# Patient Record
Sex: Male | Born: 1995 | Race: White | Hispanic: No | Marital: Single | Smoking: Never smoker
Health system: Southern US, Community
[De-identification: ages and names within clinical notes are randomized; demographics above are authoritative.]

## PROBLEM LIST (undated history)

## (undated) DIAGNOSIS — R519 Headache, unspecified: Secondary | ICD-10-CM

---

## 2017-07-10 ENCOUNTER — Inpatient Hospital Stay (HOSPITAL_COMMUNITY)
Admission: EM | Admit: 2017-07-10 | Discharge: 2017-07-12 | DRG: 558 | Disposition: A | Discharge status: Home / Self Care | Payer: BLUE CROSS/BLUE SHIELD | Attending: Internal Medicine | Admitting: Internal Medicine

## 2017-07-10 ENCOUNTER — Encounter (HOSPITAL_COMMUNITY): Payer: Self-pay | Admitting: Emergency Medicine

## 2017-07-10 DIAGNOSIS — M6282 Rhabdomyolysis: Secondary | ICD-10-CM | POA: Diagnosis not present

## 2017-07-10 DIAGNOSIS — R112 Nausea with vomiting, unspecified: Secondary | ICD-10-CM | POA: Diagnosis not present

## 2017-07-10 DIAGNOSIS — N179 Acute kidney failure, unspecified: Secondary | ICD-10-CM

## 2017-07-10 DIAGNOSIS — E860 Dehydration: Secondary | ICD-10-CM | POA: Diagnosis present

## 2017-07-10 DIAGNOSIS — E876 Hypokalemia: Secondary | ICD-10-CM | POA: Diagnosis present

## 2017-07-10 LAB — COMPREHENSIVE METABOLIC PANEL
ALBUMIN: 6 g/dL — AB (ref 3.5–5.0)
ALK PHOS: 62 U/L (ref 38–126)
ALT: 30 U/L (ref 17–63)
AST: 52 U/L — AB (ref 15–41)
Anion gap: 18 — ABNORMAL HIGH (ref 5–15)
BUN: 48 mg/dL — AB (ref 6–20)
CALCIUM: 10.5 mg/dL — AB (ref 8.9–10.3)
CO2: 23 mmol/L (ref 22–32)
Chloride: 90 mmol/L — ABNORMAL LOW (ref 101–111)
Creatinine, Ser: 2.57 mg/dL — ABNORMAL HIGH (ref 0.61–1.24)
GFR calc non Af Amer: 34 mL/min — ABNORMAL LOW (ref >60)
GFR, EST AFRICAN AMERICAN: 39 mL/min — AB (ref >60)
GLUCOSE: 115 mg/dL — AB (ref 65–99)
Potassium: 3.3 mmol/L — ABNORMAL LOW (ref 3.5–5.1)
SODIUM: 131 mmol/L — AB (ref 135–145)
Total Bilirubin: 2.3 mg/dL — ABNORMAL HIGH (ref 0.3–1.2)
Total Protein: 10.2 g/dL — ABNORMAL HIGH (ref 6.5–8.1)

## 2017-07-10 LAB — URINALYSIS, ROUTINE W REFLEX MICROSCOPIC
Glucose, UA: 50 mg/dL — AB
Ketones, ur: 5 mg/dL — AB
Leukocytes, UA: NEGATIVE
Nitrite: NEGATIVE
PH: 5 (ref 5.0–8.0)
Protein, ur: 100 mg/dL — AB
SPECIFIC GRAVITY, URINE: 1.03 (ref 1.005–1.030)

## 2017-07-10 LAB — CK: CK TOTAL: 1228 U/L — AB (ref 49–397)

## 2017-07-10 LAB — CBC
HCT: 47.5 % (ref 39.0–52.0)
Hemoglobin: 18.2 g/dL — ABNORMAL HIGH (ref 13.0–17.0)
MCH: 31.3 pg (ref 26.0–34.0)
MCHC: 38.3 g/dL — AB (ref 30.0–36.0)
MCV: 81.6 fL (ref 78.0–100.0)
PLATELETS: 278 10*3/uL (ref 150–400)
RBC: 5.82 MIL/uL — ABNORMAL HIGH (ref 4.22–5.81)
RDW: 11.9 % (ref 11.5–15.5)
WBC: 11.9 10*3/uL — ABNORMAL HIGH (ref 4.0–10.5)

## 2017-07-10 LAB — LIPASE, BLOOD: Lipase: 22 U/L (ref 11–51)

## 2017-07-10 MED ORDER — ONDANSETRON 4 MG PO TBDP
4.0000 mg | ORAL_TABLET | Freq: Once | ORAL | Status: AC | PRN
  Administered 2017-07-10: 4 mg via ORAL
  Filled 2017-07-10: qty 1

## 2017-07-10 MED ORDER — SODIUM CHLORIDE 0.9 % IV BOLUS (SEPSIS)
1000.0000 mL | Freq: Once | INTRAVENOUS | Status: AC
  Administered 2017-07-10: 1000 mL via INTRAVENOUS

## 2017-07-10 MED ORDER — GI COCKTAIL ~~LOC~~
30.0000 mL | Freq: Once | ORAL | Status: AC
  Administered 2017-07-10: 30 mL via ORAL
  Filled 2017-07-10: qty 30

## 2017-07-10 MED ORDER — ONDANSETRON HCL 4 MG/2ML IJ SOLN
4.0000 mg | Freq: Once | INTRAMUSCULAR | Status: AC
  Administered 2017-07-11: 4 mg via INTRAVENOUS
  Filled 2017-07-10: qty 2

## 2017-07-10 MED ORDER — FAMOTIDINE IN NACL 20-0.9 MG/50ML-% IV SOLN
20.0000 mg | Freq: Once | INTRAVENOUS | Status: AC
  Administered 2017-07-10: 20 mg via INTRAVENOUS
  Filled 2017-07-10: qty 50

## 2017-07-10 NOTE — ED Provider Notes (Signed)
[facility] DEPT Provider Note   CSN: [id number] Arrival date & time: 07/10/17  1742    History   Chief Complaint Chief Complaint  Patient presents with  . Abdominal Pain  . Emesis    HPI Scott Snyder is a 22 y.o. male.  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  He states that his symptoms began on Tuesday after 1 hour of circuit training with weights.  Patient reports feeling nauseous at the end of practice.  He tried to drink fluids, but vomited shortly after.  He also experienced discomfort in his upper abdomen.  Patient states that he has been unable to keep down food or fluids since onset of his symptoms 4 days ago.  He has had approximately 40 episodes of emesis.  These have been nonbloody.  He has developed progressive dysphasia without inability to swallow or drooling.  Abdominal pain is diffuse and associated with back pain.  He began to notice darker discoloration to his urine today.  And he did not void at all yesterday.  He has not had any associated fevers.  No sick contacts.  He is otherwise healthy and takes no medications on a daily basis.      History reviewed. No pertinent past medical history.  There are no active problems to display for this patient.   History reviewed. No pertinent surgical history.     Home Medications    Prior to Admission medications   Not on File    Family History No family history on file.  Social History Social History   Tobacco Use  . Smoking status: Never Smoker  . Smokeless tobacco: Never Used  Substance Use Topics  . Alcohol use: No    Frequency: Never  . Drug use: No     Allergies   Patient has no known allergies.   Review of Systems Review of Systems Ten systems reviewed and are negative for acute change, except as noted in the HPI.    Physical  Exam Updated Vital Signs BP (!) 143/103 (BP Location: Left Arm)   Pulse 68   Temp 98.8 F (37.1 C) (Oral)   Resp 19   Ht 6\' 3"  (1.905 m)   Wt 90.7 kg (200 lb)   SpO2 97%   BMI 25.00 kg/m   Physical Exam  Constitutional: He is oriented to person, place, and time. He appears well-developed and well-nourished. No distress.  HENT:  Head: Normocephalic and atraumatic.  Dry mucous membranes.  Cheilitis.  Eyes: Conjunctivae and EOM are normal. No scleral icterus.  Neck: Normal range of motion.  Cardiovascular: Regular rhythm and intact distal pulses.  Mild tachycardia  Pulmonary/Chest: Effort normal. No stridor. No respiratory distress.  Lungs clear to auscultation bilaterally.  Chest expansion symmetric.  Abdominal: Soft. He exhibits no distension. There is no guarding.  No focal abdominal tenderness on exam.  No distention or peritoneal signs.  Musculoskeletal: Normal range of motion.  Neurological: He is alert and oriented to person, place, and time. He exhibits normal muscle tone. Coordination normal.  Skin: Skin is warm and dry. No rash noted. He is not diaphoretic. No erythema. No pallor.  Psychiatric: He has a normal mood and affect. His behavior is normal.  Nursing note and vitals reviewed.    ED Treatments / Results  Labs (all labs ordered are listed, but only abnormal results are  displayed) Labs Reviewed  COMPREHENSIVE METABOLIC PANEL - Abnormal; Notable for the following components:      Result Value   Sodium 131 (*)    Potassium 3.3 (*)    Chloride 90 (*)    Glucose, Bld 115 (*)    BUN 48 (*)    Creatinine, Ser 2.57 (*)    Calcium 10.5 (*)    Total Protein 10.2 (*)    Albumin 6.0 (*)    AST 52 (*)    Total Bilirubin 2.3 (*)    GFR calc non Af Amer 34 (*)    GFR calc Af Amer 39 (*)    Anion gap 18 (*)    All other components within normal limits  CBC - Abnormal; Notable for the following components:   WBC 11.9 (*)    RBC 5.82 (*)    Hemoglobin 18.2 (*)     MCHC 38.3 (*)    All other components within normal limits  URINALYSIS, ROUTINE W REFLEX MICROSCOPIC - Abnormal; Notable for the following components:   Color, Urine AMBER (*)    APPearance HAZY (*)    Glucose, UA 50 (*)    Hgb urine dipstick MODERATE (*)    Bilirubin Urine SMALL (*)    Ketones, ur 5 (*)    Protein, ur 100 (*)    Bacteria, UA RARE (*)    Squamous Epithelial / LPF 0-5 (*)    All other components within normal limits  CK - Abnormal; Notable for the following components:   Total CK 1,228 (*)    All other components within normal limits  LIPASE, BLOOD    EKG  EKG Interpretation None       Radiology No results found.  Procedures Procedures (including critical care time)  Medications Ordered in ED Medications  ondansetron (ZOFRAN) injection 4 mg (not administered)  ondansetron (ZOFRAN-ODT) disintegrating tablet 4 mg (4 mg Oral Given 07/10/17 1833)  sodium chloride 0.9 % bolus 1,000 mL (1,000 mLs Intravenous New Bag/Given 07/10/17 2328)  gi cocktail (Maalox,Lidocaine,Donnatal) (30 mLs Oral Given 07/10/17 2320)  famotidine (PEPCID) IVPB 20 mg premix (0 mg Intravenous Stopped 07/11/17 0018)  sodium chloride 0.9 % bolus 1,000 mL (1,000 mLs Intravenous New Bag/Given 07/10/17 2328)     Initial Impression / Assessment and Plan / ED Course  I have reviewed the triage vital signs and the nursing notes.  Pertinent labs & imaging results that were available during my care of the patient were reviewed by me and considered in my medical decision making (see chart for details).     10:51 PM Patient presenting for generalized abdominal discomfort as well as back pain.  Symptoms have been associated with nausea and vomiting over the past 4 days.  Patient unable to tolerate p.o. secondary to vomiting.  Symptoms began after 1 hour of circuit training with weights.  Physical exam and current laboratory workup concerning for rhabdomyolysis.  There is evidence of acute renal  failure for which the patient will require admission.  IV hydration initiated.  We will continue to follow.  11:45 PM Urine consistent with dehydration with nonspecific pyuria.  Low suspicion for UTI.  CK is elevated supporting dehydration and rhabdomyolysis.  Patient feels slightly nauseated after receiving the GI cocktail, but states that it has helped his throat.  Zofran has been ordered for nausea management.  Case discussed with Dr. Si Raider of [facility] who will admit for hydration/management of ARF.   Final Clinical Impressions(s) / ED Diagnoses  Final diagnoses:  Acute renal failure, unspecified acute renal failure type [facility])    ED Discharge Orders    None       Antonietta Breach, PA-C 07/11/17 Nancy Marus, MD 07/11/17 [phone]

## 2017-07-10 NOTE — ED Triage Notes (Signed)
Patient reports abd pain with n/v sicne WED. Reports right lower back pain since today with orange colored urine.

## 2017-07-11 ENCOUNTER — Encounter (HOSPITAL_COMMUNITY): Payer: Self-pay

## 2017-07-11 ENCOUNTER — Other Ambulatory Visit: Payer: Self-pay

## 2017-07-11 DIAGNOSIS — R112 Nausea with vomiting, unspecified: Secondary | ICD-10-CM | POA: Diagnosis present

## 2017-07-11 DIAGNOSIS — E860 Dehydration: Secondary | ICD-10-CM | POA: Diagnosis present

## 2017-07-11 DIAGNOSIS — M6282 Rhabdomyolysis: Secondary | ICD-10-CM | POA: Diagnosis present

## 2017-07-11 DIAGNOSIS — E876 Hypokalemia: Secondary | ICD-10-CM | POA: Diagnosis present

## 2017-07-11 DIAGNOSIS — N179 Acute kidney failure, unspecified: Secondary | ICD-10-CM

## 2017-07-11 LAB — CK: Total CK: 861 U/L — ABNORMAL HIGH (ref 49–397)

## 2017-07-11 LAB — COMPREHENSIVE METABOLIC PANEL
ALT: 26 U/L (ref 17–63)
ANION GAP: 14 (ref 5–15)
AST: 37 U/L (ref 15–41)
Albumin: 4.4 g/dL (ref 3.5–5.0)
Alkaline Phosphatase: 47 U/L (ref 38–126)
BUN: 38 mg/dL — ABNORMAL HIGH (ref 6–20)
CHLORIDE: 99 mmol/L — AB (ref 101–111)
CO2: 19 mmol/L — AB (ref 22–32)
Calcium: 9 mg/dL (ref 8.9–10.3)
Creatinine, Ser: 1.52 mg/dL — ABNORMAL HIGH (ref 0.61–1.24)
GFR calc Af Amer: >60 mL/min (ref >60)
GFR calc non Af Amer: >60 mL/min (ref >60)
Glucose, Bld: 139 mg/dL — ABNORMAL HIGH (ref 65–99)
Potassium: 3.2 mmol/L — ABNORMAL LOW (ref 3.5–5.1)
Sodium: 132 mmol/L — ABNORMAL LOW (ref 135–145)
Total Bilirubin: 1.6 mg/dL — ABNORMAL HIGH (ref 0.3–1.2)
Total Protein: 7.7 g/dL (ref 6.5–8.1)

## 2017-07-11 LAB — HIV ANTIBODY (ROUTINE TESTING W REFLEX): HIV SCREEN 4TH GENERATION: NONREACTIVE

## 2017-07-11 LAB — MAGNESIUM: MAGNESIUM: 1.9 mg/dL (ref 1.7–2.4)

## 2017-07-11 MED ORDER — DEXTROSE-NACL 5-0.45 % IV SOLN
INTRAVENOUS | Status: DC
  Administered 2017-07-11 (×3): via INTRAVENOUS

## 2017-07-11 MED ORDER — ONDANSETRON HCL 4 MG/2ML IJ SOLN
4.0000 mg | Freq: Four times a day (QID) | INTRAMUSCULAR | Status: DC | PRN
  Administered 2017-07-11: 4 mg via INTRAVENOUS
  Filled 2017-07-11 (×2): qty 2

## 2017-07-11 MED ORDER — FAMOTIDINE IN NACL 20-0.9 MG/50ML-% IV SOLN
20.0000 mg | INTRAVENOUS | Status: DC
  Administered 2017-07-11 – 2017-07-12 (×2): 20 mg via INTRAVENOUS
  Filled 2017-07-11 (×2): qty 50

## 2017-07-11 MED ORDER — PROCHLORPERAZINE EDISYLATE 5 MG/ML IJ SOLN
10.0000 mg | Freq: Four times a day (QID) | INTRAMUSCULAR | Status: DC | PRN
  Administered 2017-07-11 – 2017-07-12 (×2): 10 mg via INTRAVENOUS
  Filled 2017-07-11 (×2): qty 2

## 2017-07-11 MED ORDER — PROCHLORPERAZINE EDISYLATE 5 MG/ML IJ SOLN: 10.0000 mg | Freq: Four times a day (QID) | INTRAMUSCULAR | Status: DC | PRN

## 2017-07-11 MED ORDER — MENTHOL 3 MG MT LOZG
1.0000 | LOZENGE | OROMUCOSAL | Status: DC | PRN
  Administered 2017-07-11: 3 mg via ORAL
  Filled 2017-07-11: qty 9

## 2017-07-11 MED ORDER — BOOST / RESOURCE BREEZE PO LIQD CUSTOM
1.0000 | Freq: Three times a day (TID) | ORAL | Status: DC
  Administered 2017-07-11: 1 via ORAL

## 2017-07-11 MED ORDER — ONDANSETRON HCL 40 MG/20ML IJ SOLN
8.0000 mg | Freq: Four times a day (QID) | INTRAMUSCULAR | Status: DC | PRN
  Filled 2017-07-11: qty 4

## 2017-07-11 MED ORDER — POTASSIUM CHLORIDE CRYS ER 20 MEQ PO TBCR
40.0000 meq | EXTENDED_RELEASE_TABLET | ORAL | Status: AC
  Administered 2017-07-11 (×2): 40 meq via ORAL
  Filled 2017-07-11 (×2): qty 2

## 2017-07-11 MED ORDER — SODIUM CHLORIDE 0.9 % IV BOLUS (SEPSIS)
2000.0000 mL | Freq: Once | INTRAVENOUS | Status: AC
  Administered 2017-07-11: 2000 mL via INTRAVENOUS

## 2017-07-11 MED ORDER — ONDANSETRON 4 MG PO TBDP
8.0000 mg | ORAL_TABLET | Freq: Once | ORAL | Status: AC | PRN
  Administered 2017-07-11: 8 mg via ORAL
  Filled 2017-07-11: qty 2

## 2017-07-11 NOTE — Plan of Care (Signed)
Admit 07/11/17

## 2017-07-11 NOTE — H&P (Signed)
History and Physical    Ruhan Borak HER:[id number] DOB: Jul 16, 1995 DOA: 07/10/2017  PCP: Patient, No Pcp Per  Patient coming from: home   Chief Complaint: nausea and vomiting  HPI: Cosimo Schertzer is a 22 y.o. male with no sig medical history presents w/ vomiting.  Was in normal state of health until symptoms began 2 days ago. Is an athlete - plays football - and went to morning practice where engaged in more vigorous than usual training, which included lots of squats and other weight training. Towards the end developed abdominal cramps and leg cramps and then developed nausea with multiple episodes of nbnb vomiting. No fever or diarrhea. Abdominal cramps and leg cramps persisted into the next day, as did multiple episodes of vomtiing and inability to tollerate PO. Also developed dark orange urine. Nausea and vomiting have improved today but did vomit 4 times today. musle pains have improved - no abdominal pain currently, but does have throat pain. Denies history of similar symptoms  ED Course: IV fluids, labs, anti-emetic  Review of Systems: As per HPI otherwise 10 point review of systems negative.    History reviewed. No pertinent past medical history.  History reviewed. No pertinent surgical history.   reports that  has never smoked. he has never used smokeless tobacco. He reports that he does not drink alcohol or use drugs.  No Known Allergies  No family history on file.  Prior to Admission medications   Not on File    Physical Exam: Vitals:   07/10/17 1829 07/10/17 2041 07/10/17 2300 07/10/17 2340  BP: 133/89 (!) 126/98 (!) 144/97 (!) 143/103  Pulse: 96 (!) 105 72 68  Resp: 19 20 17 19   Temp: 97.6 F (36.4 C) 98.8 F (37.1 C)    TempSrc: Oral Oral    SpO2: 99% 97% 100% 97%  Weight: 90.7 kg (200 lb)     Height: 6\' 3"  (1.905 m)       Constitutional: No acute distress Head: Atraumatic Eyes: Conjunctiva clear ENM: Moist mucous membranes. Normal dentition.  Neck:  Supple Respiratory: Clear to auscultation bilaterally, no wheezing/rales/rhonchi. Normal respiratory effort. No accessory muscle use. . Cardiovascular: Regular rate and rhythm. No murmurs/rubs/gallops. Abdomen: Non-tender, non-distended. No masses. No rebound or guarding. Positive bowel sounds. Musculoskeletal: No joint deformity upper and lower extremities. Normal ROM, no contractures. Normal muscle tone.  Skin: No rashes, lesions, or ulcers.  Extremities: No peripheral edema. Palpable peripheral pulses. Neurologic: Alert, moving all 4 extremities. Psychiatric: Normal insight and judgement.   Labs on Admission: I have personally reviewed following labs and imaging studies  CBC: Recent Labs  Lab 07/10/17 1836  WBC 11.9*  HGB 18.2*  HCT 47.5  MCV 81.6  PLT 856   Basic Metabolic Panel: Recent Labs  Lab 07/10/17 1836  NA 131*  K 3.3*  CL 90*  CO2 23  GLUCOSE 115*  BUN 48*  CREATININE 2.57*  CALCIUM 10.5*   GFR: Estimated Creatinine Clearance: 54.3 mL/min (A) (by C-G formula based on SCr of 2.57 mg/dL (H)). Liver Function Tests: Recent Labs  Lab 07/10/17 1836  AST 52*  ALT 30  ALKPHOS 62  BILITOT 2.3*  PROT 10.2*  ALBUMIN 6.0*   Recent Labs  Lab 07/10/17 1836  LIPASE 22   No results for input(s): AMMONIA in the last 168 hours. Coagulation Profile: No results for input(s): INR, PROTIME in the last 168 hours. Cardiac Enzymes: Recent Labs  Lab 07/10/17 2217  CKTOTAL 1,228*   BNP (last 3  results) No results for input(s): PROBNP in the last 8760 hours. HbA1C: No results for input(s): HGBA1C in the last 72 hours. CBG: No results for input(s): GLUCAP in the last 168 hours. Lipid Profile: No results for input(s): CHOL, HDL, LDLCALC, TRIG, CHOLHDL, LDLDIRECT in the last 72 hours. Thyroid Function Tests: No results for input(s): TSH, T4TOTAL, FREET4, T3FREE, THYROIDAB in the last 72 hours. Anemia Panel: No results for input(s): VITAMINB12, FOLATE, FERRITIN,  TIBC, IRON, RETICCTPCT in the last 72 hours. Urine analysis:    Component Value Date/Time   COLORURINE AMBER (A) 07/10/2017 2043   APPEARANCEUR HAZY (A) 07/10/2017 2043   LABSPEC 1.030 07/10/2017 2043   PHURINE 5.0 07/10/2017 2043   GLUCOSEU 50 (A) 07/10/2017 2043   HGBUR MODERATE (A) 07/10/2017 2043   BILIRUBINUR SMALL (A) 07/10/2017 2043   KETONESUR 5 (A) 07/10/2017 2043   PROTEINUR 100 (A) 07/10/2017 2043   NITRITE NEGATIVE 07/10/2017 2043   LEUKOCYTESUR NEGATIVE 07/10/2017 2043    Radiological Exams on Admission: No results found.    Assessment/Plan Active Problems:   Rhabdomyolysis   AKI (acute kidney injury) ([place])   # Acute rhabdomyolysis - typical symptoms, and in setting of intense physical exercise. CK elevated to 1000s, ua significant for blood and bilirubin, and AKI present. Mentating clearly. Appears to be resolving. Is s/p 3 L NS in ED. Given symptoms began right after intense physical work-out, and given no fevers or diarrhea, think GI infection much less likely. - d51/2ns @ 200/hr - repeat cmp in AM - zofran prn - clear liquids, advance as tolerated - f/u EKG  # AKI - 2/2 rhabdomyolysis and dehydration. - fluids and kidney function monitoring as above  DVT prophylaxis: SCDs, early ambulation Code Status: full  Family Communication: mother lela Fede [phone]  Disposition Plan: home  Consults called: none  Admission status: med/surg    Desma Maxim MD [facility] Pager [phone]  If 7PM-7AM, please contact night-coverage [email or web address] Password [facility]  07/11/2017, 12:24 AM

## 2017-07-11 NOTE — Progress Notes (Signed)
Dr. Grandville Silos aware via text pt vomited after Resource nutrition drink. See orders entered into Epic by MD to dc supplement and give one time Zofran 8 mg SL. Pt reassured.

## 2017-07-11 NOTE — ED Notes (Addendum)
WILL TRANSPORT PT TO 5W 1535-1. AAOX4. PT IN NO APPARENT DISTRESS OR PAIN. THE OPPORTUNITY TO ASK QUESTIONS WAS PROVIDED.  ED TO INPATIENT HANDOFF REPORT  Name/Age/Gender Scott Snyder 22 y.o. male  Code Status    Code Status Orders  (From admission, onward)        Start     Ordered   07/11/17 0145  Full code  Continuous     07/11/17 0144    Code Status History    Date Active Date Inactive Code Status Order ID Comments User Context   This patient has a current code status but no historical code status.      Home/SNF/Other Home  Chief Complaint Acute renal failure, unspecified acute renal failure type ([place]) [N17.9]  Level of Care/Admitting Diagnosis ED Disposition    ED Disposition Condition Comment   Admit  Hospital Area: [facility] [[id number]]  Level of Care: Med-Surg [16]  Diagnosis: Rhabdomyolysis [728.88.ICD-9-CM]  Admitting Physician: Eston Esters  Attending Physician: Eston Esters  Estimated length of stay: past midnight tomorrow  Certification:: I certify this patient will need inpatient services for at least 2 midnights  PT Class (Do Not Modify): Inpatient [101]  PT Acc Code (Do Not Modify): Private [1]       Medical History History reviewed. No pertinent past medical history.  Allergies No Known Allergies  IV Location/Drains/Wounds Patient Lines/Drains/Airways Status   Active Line/Drains/Airways    Name:   Placement date:   Placement time:   Site:   Days:   Peripheral IV 07/10/17 Right Antecubital   07/10/17    2327    Antecubital   1          Labs/Imaging Results for orders placed or performed during the hospital encounter of 07/10/17 (from the past 48 hour(s))  Lipase, blood     Status: None   Collection Time: 07/10/17  6:36 PM  Result Value Ref Range   Lipase 22 11 - 51 U/L  Comprehensive metabolic panel     Status: Abnormal   Collection Time: 07/10/17  6:36 PM  Result Value Ref Range   Sodium 131 (L) 135 - 145 mmol/L   Potassium 3.3 (L) 3.5 - 5.1 mmol/L   Chloride 90 (L) 101 - 111 mmol/L   CO2 23 22 - 32 mmol/L   Glucose, Bld 115 (H) 65 - 99 mg/dL   BUN 48 (H) 6 - 20 mg/dL   Creatinine, Ser 2.57 (H) 0.61 - 1.24 mg/dL   Calcium 10.5 (H) 8.9 - 10.3 mg/dL   Total Protein 10.2 (H) 6.5 - 8.1 g/dL   Albumin 6.0 (H) 3.5 - 5.0 g/dL   AST 52 (H) 15 - 41 U/L   ALT 30 17 - 63 U/L   Alkaline Phosphatase 62 38 - 126 U/L   Total Bilirubin 2.3 (H) 0.3 - 1.2 mg/dL   GFR calc non Af Amer 34 (L) >60 mL/min   GFR calc Af Amer 39 (L) >60 mL/min    Comment: (NOTE) The eGFR has been calculated using the CKD EPI equation. This calculation has not been validated in all clinical situations. eGFR's persistently <60 mL/min signify possible Chronic Kidney Disease.    Anion gap 18 (H) 5 - 15  CBC     Status: Abnormal   Collection Time: 07/10/17  6:36 PM  Result Value Ref Range   WBC 11.9 (H) 4.0 - 10.5 K/uL   RBC 5.82 (H) 4.22 - 5.81 MIL/uL  Hemoglobin 18.2 (H) 13.0 - 17.0 g/dL   HCT 47.5 39.0 - 52.0 %   MCV 81.6 78.0 - 100.0 fL   MCH 31.3 26.0 - 34.0 pg   MCHC 38.3 (H) 30.0 - 36.0 g/dL    Comment: RULED OUT INTERFERING SUBSTANCES   RDW 11.9 11.5 - 15.5 %   Platelets 278 150 - 400 K/uL  Urinalysis, Routine w reflex microscopic     Status: Abnormal   Collection Time: 07/10/17  8:43 PM  Result Value Ref Range   Color, Urine AMBER (A) YELLOW    Comment: BIOCHEMICALS MAY BE AFFECTED BY COLOR   APPearance HAZY (A) CLEAR   Specific Gravity, Urine 1.030 1.005 - 1.030   pH 5.0 5.0 - 8.0   Glucose, UA 50 (A) NEGATIVE mg/dL   Hgb urine dipstick MODERATE (A) NEGATIVE   Bilirubin Urine SMALL (A) NEGATIVE   Ketones, ur 5 (A) NEGATIVE mg/dL   Protein, ur 100 (A) NEGATIVE mg/dL   Nitrite NEGATIVE NEGATIVE   Leukocytes, UA NEGATIVE NEGATIVE   RBC / HPF 0-5 0 - 5 RBC/hpf   WBC, UA 6-30 0 - 5 WBC/hpf   Bacteria, UA RARE (A) NONE SEEN   Squamous Epithelial / LPF 0-5 (A) NONE SEEN   Mucus  PRESENT    Hyaline Casts, UA PRESENT   CK     Status: Abnormal   Collection Time: 07/10/17 10:17 PM  Result Value Ref Range   Total CK 1,228 (H) 49 - 397 U/L   No results found.  Pending Labs Unresulted Labs (From admission, onward)   Start     Ordered   07/11/17 0500  Comprehensive metabolic panel  Tomorrow morning,   R     07/11/17 0144   07/11/17 0500  CK  Tomorrow morning,   R     07/11/17 0144   07/11/17 0500  HIV antibody (Routine Testing)  Tomorrow morning,   R     07/11/17 0144      Vitals/Pain Today's Vitals   07/11/17 0100 07/11/17 0110 07/11/17 0110 07/11/17 0127  BP: (!) 145/91     Pulse: 70     Resp: 13     Temp:  98.6 F (37 C)    TempSrc:  Oral    SpO2: 100%     Weight:      Height:      PainSc:   0-No pain 0-No pain    Isolation Precautions No active isolations  Medications Medications  dextrose 5 %-0.45 % sodium chloride infusion (not administered)  ondansetron (ZOFRAN) injection 4 mg (not administered)  ondansetron (ZOFRAN-ODT) disintegrating tablet 4 mg (4 mg Oral Given 07/10/17 1833)  sodium chloride 0.9 % bolus 1,000 mL (0 mLs Intravenous Stopped 07/11/17 0126)  gi cocktail (Maalox,Lidocaine,Donnatal) (30 mLs Oral Given 07/10/17 2320)  famotidine (PEPCID) IVPB 20 mg premix (0 mg Intravenous Stopped 07/11/17 0018)  sodium chloride 0.9 % bolus 1,000 mL (0 mLs Intravenous Stopped 07/11/17 0126)  ondansetron (ZOFRAN) injection 4 mg (4 mg Intravenous Given 07/11/17 0021)    Mobility walks

## 2017-07-11 NOTE — Progress Notes (Signed)
PROGRESS NOTE    Scott Snyder  AOZ:[id number] DOB: 12-01-1995 DOA: 07/10/2017 PCP: Patient, No Pcp Per    Brief Narrative:   Scott Snyder is a 22 y.o. male with no sig medical history presents w/ vomiting.  Was in normal state of health until symptoms began 2 days ago. Is an athlete - plays football - and went to morning practice where engaged in more vigorous than usual training, which included lots of squats and other weight training. Towards the end developed abdominal cramps and leg cramps and then developed nausea with multiple episodes of nbnb vomiting. No fever or diarrhea. Abdominal cramps and leg cramps persisted into the next day, as did multiple episodes of vomtiing and inability to tollerate PO. Also developed dark orange urine. Nausea and vomiting have improved today but did vomit 4 times today. musle pains have improved - no abdominal pain currently, but does have throat pain. Denies history of similar symptoms     Assessment & Plan:   Principal Problem:   Rhabdomyolysis Active Problems:   AKI (acute kidney injury) ([place])   Dehydration   Nausea and vomiting   Hypokalemia   #1 acute rhabdomyolysis Secondary to intense physical exercise and probable dehydration.  CK levels were elevated on admission and seem to be trending down.  Acute renal failure noted on admission with creatinine trending down with hydration.  Patient with good mentation.  Patient with a urine output so far of 2-1/2 L.  Increase IV fluids to 250 cc/h.  Bolus of 2 L normal saline.  Antiemetics.  Pain management.  Supportive care.  Clear liquid diet.  2.  Acute renal failure Likely secondary to problem #1 and prerenal azotemia.  Renal function improving with hydration.  Follow.  3.  Hypokalemia Replete.  4.  Dehydration IV fluids.  5.  Nausea/ vomiting Secondary to problem #1.  Improving.  Continue clear liquids for now.  IV fluids.  Supportive care.  Will advance diet to full liquid diet for  breakfast tomorrow and follow.    DVT prophylaxis: SCDs Code Status: Full Family Communication: Updated mother via telephone Disposition Plan: Likely home once rhabdomyolysis has improved, acute renal resolved hopefully in the next 1-2 days.   Consultants:   None  Procedures:   None  Antimicrobials:   None   Subjective: Patient sleeping however easily arousable.  Denies any further emesis however does endorse some nausea.  Abdominal pain and lower extremity pain improved.  Patient states good urine output.  Objective: Vitals:   07/11/17 0100 07/11/17 0110 07/11/17 0150 07/11/17 0513  BP: (!) 145/91  140/81 139/73  Pulse: 70  70 86  Resp: 13  18 16   Temp:  98.6 F (37 C) 98.2 F (36.8 C) 98.4 F (36.9 C)  TempSrc:  Oral Oral Oral  SpO2: 100%  100% 97%  Weight:   91.1 kg (200 lb 13.4 oz)   Height:   6\' 3"  (1.905 m)     Intake/Output Summary (Last 24 hours) at 07/11/2017 1341 Last data filed at 07/11/2017 1324 Gross per 24 hour  Intake 6014.17 ml  Output 3400 ml  Net 2614.17 ml   Filed Weights   07/10/17 1829 07/11/17 0150  Weight: 90.7 kg (200 lb) 91.1 kg (200 lb 13.4 oz)    Examination:  General exam: Appears calm and comfortable  Respiratory system: Clear to auscultation. Respiratory effort normal. Cardiovascular system: S1 & S2 heard, RRR. No JVD, murmurs, rubs, gallops or clicks. No pedal edema. Gastrointestinal system:  Abdomen is nondistended, soft and nontender. No organomegaly or masses felt. Normal bowel sounds heard. Central nervous system: Alert and oriented. No focal neurological deficits. Extremities: Symmetric 5 x 5 power. Skin: No rashes, lesions or ulcers Psychiatry: Judgement and insight appear normal. Mood & affect appropriate.     Data Reviewed: I have personally reviewed following labs and imaging studies  CBC: Recent Labs  Lab 07/10/17 1836  WBC 11.9*  HGB 18.2*  HCT 47.5  MCV 81.6  PLT 299   Basic Metabolic Panel: Recent  Labs  Lab 07/10/17 1836 07/11/17 0354  NA 131* 132*  K 3.3* 3.2*  CL 90* 99*  CO2 23 19*  GLUCOSE 115* 139*  BUN 48* 38*  CREATININE 2.57* 1.52*  CALCIUM 10.5* 9.0  MG  --  1.9   GFR: Estimated Creatinine Clearance: 91.9 mL/min (A) (by C-G formula based on SCr of 1.52 mg/dL (H)). Liver Function Tests: Recent Labs  Lab 07/10/17 1836 07/11/17 0354  AST 52* 37  ALT 30 26  ALKPHOS 62 47  BILITOT 2.3* 1.6*  PROT 10.2* 7.7  ALBUMIN 6.0* 4.4   Recent Labs  Lab 07/10/17 1836  LIPASE 22   No results for input(s): AMMONIA in the last 168 hours. Coagulation Profile: No results for input(s): INR, PROTIME in the last 168 hours. Cardiac Enzymes: Recent Labs  Lab 07/10/17 2217 07/11/17 0354  CKTOTAL 1,228* 861*   BNP (last 3 results) No results for input(s): PROBNP in the last 8760 hours. HbA1C: No results for input(s): HGBA1C in the last 72 hours. CBG: No results for input(s): GLUCAP in the last 168 hours. Lipid Profile: No results for input(s): CHOL, HDL, LDLCALC, TRIG, CHOLHDL, LDLDIRECT in the last 72 hours. Thyroid Function Tests: No results for input(s): TSH, T4TOTAL, FREET4, T3FREE, THYROIDAB in the last 72 hours. Anemia Panel: No results for input(s): VITAMINB12, FOLATE, FERRITIN, TIBC, IRON, RETICCTPCT in the last 72 hours. Sepsis Labs: No results for input(s): PROCALCITON, LATICACIDVEN in the last 168 hours.  No results found for this or any previous visit (from the past 240 hour(s)).       Radiology Studies: No results found.      Scheduled Meds: . feeding supplement  1 Container Oral TID BM  . potassium chloride  40 mEq Oral Q4H   Continuous Infusions: . dextrose 5 % and 0.45% NaCl 250 mL/hr at 07/11/17 1158  . famotidine (PEPCID) IV Stopped (07/11/17 2426)  . ondansetron (ZOFRAN) IV       LOS: 0 days    Time spent: 35 minutes    Irine Seal, MD [facility] Pager [phone]  If 7PM-7AM, please contact  night-coverage [email or web address] Password [facility] 07/11/2017, 1:41 PM

## 2017-07-11 NOTE — Progress Notes (Signed)
See new order entered into Epic by Dr. Grandville Silos in response to recent text.

## 2017-07-11 NOTE — Progress Notes (Signed)
Aromatherapy effective. Pt's nausea better. Dozing reassurance given.

## 2017-07-11 NOTE — Progress Notes (Signed)
Dr. Grandville Silos notified that pt needs alternative antiemetic as well as something for "sore throat". Awaiting orders. Blake Divine, RN provided aromatherapy with ginger at bedside in the meantime. Reassurance given.

## 2017-07-12 DIAGNOSIS — N179 Acute kidney failure, unspecified: Secondary | ICD-10-CM

## 2017-07-12 LAB — CBC WITH DIFFERENTIAL/PLATELET
BASOS ABS: 0.1 10*3/uL (ref 0.0–0.1)
BASOS PCT: 1 %
Eosinophils Absolute: 0.2 10*3/uL (ref 0.0–0.7)
Eosinophils Relative: 4 %
HEMATOCRIT: 36.6 % — AB (ref 39.0–52.0)
HEMOGLOBIN: 12.9 g/dL — AB (ref 13.0–17.0)
Lymphocytes Relative: 46 %
Lymphs Abs: 2.9 10*3/uL (ref 0.7–4.0)
MCH: 30.5 pg (ref 26.0–34.0)
MCHC: 35.2 g/dL (ref 30.0–36.0)
MCV: 86.5 fL (ref 78.0–100.0)
Monocytes Absolute: 0.5 10*3/uL (ref 0.1–1.0)
Monocytes Relative: 8 %
NEUTROS ABS: 2.6 10*3/uL (ref 1.7–7.7)
NEUTROS PCT: 41 %
Platelets: 199 10*3/uL (ref 150–400)
RBC: 4.23 MIL/uL (ref 4.22–5.81)
RDW: 12.1 % (ref 11.5–15.5)
WBC: 6.3 10*3/uL (ref 4.0–10.5)

## 2017-07-12 LAB — COMPREHENSIVE METABOLIC PANEL
ALT: 23 U/L (ref 17–63)
ANION GAP: 4 — AB (ref 5–15)
AST: 28 U/L (ref 15–41)
Albumin: 3.9 g/dL (ref 3.5–5.0)
Alkaline Phosphatase: 41 U/L (ref 38–126)
BUN: 9 mg/dL (ref 6–20)
CHLORIDE: 104 mmol/L (ref 101–111)
CO2: 29 mmol/L (ref 22–32)
Calcium: 8.6 mg/dL — ABNORMAL LOW (ref 8.9–10.3)
Creatinine, Ser: 0.87 mg/dL (ref 0.61–1.24)
Glucose, Bld: 102 mg/dL — ABNORMAL HIGH (ref 65–99)
POTASSIUM: 3.9 mmol/L (ref 3.5–5.1)
Sodium: 137 mmol/L (ref 135–145)
Total Bilirubin: 1.3 mg/dL — ABNORMAL HIGH (ref 0.3–1.2)
Total Protein: 6.5 g/dL (ref 6.5–8.1)

## 2017-07-12 LAB — CK: CK TOTAL: 410 U/L — AB (ref 49–397)

## 2017-07-12 LAB — MAGNESIUM: MAGNESIUM: 2.1 mg/dL (ref 1.7–2.4)

## 2017-07-12 MED ORDER — FAMOTIDINE 20 MG PO TABS: 20.0000 mg | ORAL_TABLET | Freq: Every day | ORAL | Status: DC

## 2017-07-12 MED ORDER — ONDANSETRON HCL 4 MG PO TABS: 4.0000 mg | ORAL_TABLET | Freq: Three times a day (TID) | ORAL | 0 refills | Status: DC | PRN

## 2017-07-12 NOTE — Discharge Summary (Signed)
Physician Discharge Summary  Scott Snyder RCV:[id number] DOB: 1995/08/06 DOA: 07/10/2017  PCP: Patient, No Pcp Per  Admit date: 07/10/2017 Discharge date: 07/12/2017  Time spent: 65 minutes  Recommendations for Outpatient Follow-up:  1. Follow-up with PCP in 1-2 weeks.  On follow-up patient will need a basic metabolic profile done to follow-up on electrolytes and renal function.  Patient will also need a total CK level checked.   Discharge Diagnoses:  Principal Problem:   Rhabdomyolysis Active Problems:   AKI (acute kidney injury) ([place])   Dehydration   Nausea and vomiting   Hypokalemia   Discharge Condition: stable and improved.  Diet recommendation: Regular  Filed Weights   07/10/17 1829 07/11/17 0150 07/12/17 0540  Weight: 90.7 kg (200 lb) 91.1 kg (200 lb 13.4 oz) 91 kg (200 lb 9.9 oz)    History of present illness:  Per Dr.Wouk  Scott Snyder is a 22 y.o. male with no sig medical history presents w/ vomiting.  Was in normal state of health until symptoms began 2 days ago. Is an athlete - plays football - and went to morning practice where engaged in more vigorous than usual training, which included lots of squats and other weight training. Towards the end developed abdominal cramps and leg cramps and then developed nausea with multiple episodes of nbnb vomiting. No fever or diarrhea. Abdominal cramps and leg cramps persisted into the next day, as did multiple episodes of vomtiing and inability to tollerate PO. Also developed dark orange urine. Nausea and vomiting have improved, but did vomit 4 times today. musle pains have improved - no abdominal pain currently, but does have throat pain. Denied history of similar symptoms  ED Course: IV fluids, labs, anti-emetic    Hospital Course:  #1 acute rhabdomyolysis Secondary to intense physical exercise and probable dehydration.  CK levels were elevated on admission and trended down with aggressive hydration.  Acute renal failure  noted on admission with creatinine elevated at 2.57.  Patient was hydrated aggressively with IV fluids and renal function had resolved by day of discharge.  Patient had good urine output and patient was placed on anti-medics and supportive care.  Patient improved clinically started on a clear liquid diet which he tolerated which was advanced to a soft diet.  Patient be discharged in stable and improved condition.  Outpatient follow-up.   2.  Acute renal failure Likely secondary to problem #1 and prerenal azotemia.  Patient was hydrated aggressively with IV fluids  and renal function improved such that by day of discharge creatinine was down to 0.87 from 2.57 on admission.  Outpatient follow-up.    3.  Hypokalemia Repleted.  4.  Dehydration Patient was hydrated aggressively with IV fluids due to rhabdomyolysis and was euvolemic by day of discharge.    5.  Nausea/ vomiting Secondary to problem #1.  Improved clinically.  Patient was started on clear liquids did not have any further emesis.  Patient was hydrated aggressively with IV fluids.  Patient's diet was advanced to a soft diet which he tolerated and patient was discharged in stable and improved condition.  Outpatient follow-up with the PCP in 2 weeks.     Procedures:  None  Consultations:  None  Discharge Exam: Vitals:   07/12/17 0540 07/12/17 1401  BP: 129/70 135/87  Pulse: 68 77  Resp: 18 18  Temp: 97.8 F (36.6 C) 98.2 F (36.8 C)  SpO2: 100% 98%    General: NAD Cardiovascular: RRR Respiratory: Clear to auscultation bilaterally  Discharge Instructions   Discharge Instructions    Diet general   Complete by:  As directed    Increase activity slowly   Complete by:  As directed      Allergies as of 07/12/2017   No Known Allergies     Medication List    TAKE these medications   ondansetron 4 MG tablet Commonly known as:  ZOFRAN Take 1-2 tablets (4-8 mg total) by mouth every 8 (eight) hours as needed  for nausea or vomiting.      No Known Allergies Follow-up Information    PCP. Schedule an appointment as soon as possible for a visit in 2 week(s).   Why:  F/U IN 1-2 WEEKS           The results of significant diagnostics from this hospitalization (including imaging, microbiology, ancillary and laboratory) are listed below for reference.    Significant Diagnostic Studies: No results found.  Microbiology: No results found for this or any previous visit (from the past 240 hour(s)).   Labs: Basic Metabolic Panel: Recent Labs  Lab 07/10/17 1836 07/11/17 0354 07/12/17 0433  NA 131* 132* 137  K 3.3* 3.2* 3.9  CL 90* 99* 104  CO2 23 19* 29  GLUCOSE 115* 139* 102*  BUN 48* 38* 9  CREATININE 2.57* 1.52* 0.87  CALCIUM 10.5* 9.0 8.6*  MG  --  1.9 2.1   Liver Function Tests: Recent Labs  Lab 07/10/17 1836 07/11/17 0354 07/12/17 0433  AST 52* 37 28  ALT 30 26 23   ALKPHOS 62 47 41  BILITOT 2.3* 1.6* 1.3*  PROT 10.2* 7.7 6.5  ALBUMIN 6.0* 4.4 3.9   Recent Labs  Lab 07/10/17 1836  LIPASE 22   No results for input(s): AMMONIA in the last 168 hours. CBC: Recent Labs  Lab 07/10/17 1836 07/12/17 0433  WBC 11.9* 6.3  NEUTROABS  --  2.6  HGB 18.2* 12.9*  HCT 47.5 36.6*  MCV 81.6 86.5  PLT 278 199   Cardiac Enzymes: Recent Labs  Lab 07/10/17 2217 07/11/17 0354 07/12/17 0433  CKTOTAL 1,228* 861* 410*   BNP: BNP (last 3 results) No results for input(s): BNP in the last 8760 hours.  ProBNP (last 3 results) No results for input(s): PROBNP in the last 8760 hours.  CBG: No results for input(s): GLUCAP in the last 168 hours.     Signed:  Irine Seal MD.  [facility] 07/12/2017, 4:38 PM

## 2017-07-12 NOTE — Progress Notes (Signed)
Assessment unchanged. Nausea free after regular soft diet tray. Pt verbalized understanding of dc instructions through teach back including to follow with PCP or medical staff @ [facility] as well as if  any other issues arise. Script x 1 for po Zofran given as provided by Md. Discharged via foot per pt request accompanied by NT to front entrance to meet pt's ride home.

## 2017-07-28 ENCOUNTER — Ambulatory Visit (INDEPENDENT_AMBULATORY_CARE_PROVIDER_SITE_OTHER): Payer: BLUE CROSS/BLUE SHIELD | Admitting: Sports Medicine

## 2017-07-28 VITALS — BP 142/88 | Ht 75.0 in | Wt 200.0 lb

## 2017-07-28 DIAGNOSIS — M6282 Rhabdomyolysis: Secondary | ICD-10-CM

## 2017-07-29 ENCOUNTER — Encounter: Payer: Self-pay | Admitting: Sports Medicine

## 2017-07-29 NOTE — Progress Notes (Signed)
Patient ID: Scott Snyder, male   DOB: 1995-12-11, 22 y.o.   MRN: [id number]  Scott Snyder is a [employer] that comes in today at my request to discuss a recent hospitalization for rhabdomyolysis. Patient states while working out in the weight room on January 22 he was feeling fine. There was nothing unusual about his workout session. Although it was a little more intense than usual it was certainly not something he wasn't accustomed to. He was able to complete his workout without any problem. Later that evening he began to develop nausea and vomiting which persisted for greater than 24 hours. He was able to hold down liquids to some extent but was unable to eat any food. When his symptoms worsened, his roommate drove him to the emergency room and he was admitted for rhabdomyolysis and acute renal injury. Patient was hospitalized for a little over 24 hours and rehydrated aggressively with IV fluids. Over the course of his hospitalization his renal function returned to normal. His CK levels dropped from 1228 to 410. After he was discharged, he was then seen at [facility] for follow-up blood work. This was on February 1. A CMP at that time was completely normal. Normal CK.  Patient has been completely asymptomatic since the time of his discharge. He denies any prior occurrences. He is feeling great. He is otherwise healthy. I think he is safe to resume his workouts but I would like for him to do this gradually and to make sure that he has plenty of fluid for rehydration. It's unclear to me whether or not he had a GI illness which compounded his rhabdomyolysis but there is no doubt that he had a mild case of rhabdomyolysis (supported by his blood work and Urine dip/UA at the time of admission). I will continue to follow his progress through the training room at [employer]. Follow-up formally in the office as needed.

## 2021-02-13 ENCOUNTER — Encounter (HOSPITAL_COMMUNITY): Payer: Self-pay | Admitting: *Deleted

## 2021-02-13 ENCOUNTER — Ambulatory Visit (HOSPITAL_COMMUNITY)
Admission: EM | Admit: 2021-02-13 | Discharge: 2021-02-13 | Disposition: A | Discharge status: Home / Self Care | Payer: BLUE CROSS/BLUE SHIELD | Attending: Family Medicine | Admitting: Family Medicine

## 2021-02-13 ENCOUNTER — Ambulatory Visit: Admit: 2021-02-13 | Discharge status: Absent without leave | Payer: BLUE CROSS/BLUE SHIELD

## 2021-02-13 DIAGNOSIS — R112 Nausea with vomiting, unspecified: Secondary | ICD-10-CM | POA: Diagnosis not present

## 2021-02-13 DIAGNOSIS — R519 Headache, unspecified: Secondary | ICD-10-CM | POA: Diagnosis not present

## 2021-02-13 MED ORDER — DEXAMETHASONE SODIUM PHOSPHATE 10 MG/ML IJ SOLN
10.0000 mg | Freq: Once | INTRAMUSCULAR | Status: AC
  Administered 2021-02-13: 10 mg via INTRAMUSCULAR

## 2021-02-13 MED ORDER — KETOROLAC TROMETHAMINE 30 MG/ML IJ SOLN
30.0000 mg | Freq: Once | INTRAMUSCULAR | Status: AC
  Administered 2021-02-13: 30 mg via INTRAMUSCULAR

## 2021-02-13 MED ORDER — DEXAMETHASONE SODIUM PHOSPHATE 10 MG/ML IJ SOLN
INTRAMUSCULAR | Status: AC
  Filled 2021-02-13: qty 1

## 2021-02-13 MED ORDER — METOCLOPRAMIDE HCL 5 MG/ML IJ SOLN
5.0000 mg | Freq: Once | INTRAMUSCULAR | Status: AC
  Administered 2021-02-13: 5 mg via INTRAMUSCULAR

## 2021-02-13 MED ORDER — KETOROLAC TROMETHAMINE 30 MG/ML IJ SOLN
INTRAMUSCULAR | Status: AC
  Filled 2021-02-13: qty 1

## 2021-02-13 MED ORDER — ONDANSETRON 4 MG PO TBDP: 4.0000 mg | ORAL_TABLET | Freq: Three times a day (TID) | ORAL | 0 refills | Status: DC | PRN

## 2021-02-13 MED ORDER — METOCLOPRAMIDE HCL 5 MG/ML IJ SOLN
INTRAMUSCULAR | Status: AC
  Filled 2021-02-13: qty 2

## 2021-02-13 MED ORDER — BUDESONIDE 32 MCG/ACT NA SUSP: 1.0000 | Freq: Two times a day (BID) | NASAL | 2 refills | Status: DC

## 2021-02-13 MED ORDER — SUMATRIPTAN SUCCINATE 50 MG PO TABS: ORAL_TABLET | ORAL | 0 refills | Status: DC

## 2021-02-13 NOTE — ED Provider Notes (Signed)
Scott Snyder    CSN: [id number] Arrival date & time: 02/13/21  1023      History   Chief Complaint Chief Complaint  Patient presents with   Headache    3 weeks   Emesis    HPI Scott Snyder is a 25 y.o. male.   Patient presenting today with a 3-week history of intermittent frontal headaches.  He is also noticed the past week and a half he has been nauseated off and on.  The nausea usually comes on when the pain comes on.  He denies dizziness, vision changes, speech or swallowing changes, vomiting, bowel changes, fevers, head injury recently, new medications or supplements, diet or routine changes.  Has been trying Tylenol and Excedrin Migraine with temporary relief.  Brother with history of migraines.   History reviewed. No pertinent past medical history.  Patient Active Problem List   Diagnosis Date Noted   Acute renal failure ([place])    Rhabdomyolysis 07/11/2017   AKI (acute kidney injury) ([place]) 07/11/2017   Dehydration 07/11/2017   Nausea and vomiting 07/11/2017   Hypokalemia 07/11/2017    History reviewed. No pertinent surgical history.     Home Medications    Prior to Admission medications   Medication Sig Start Date End Date Taking? Authorizing Provider  budesonide (RHINOCORT ALLERGY) 32 MCG/ACT nasal spray Place 1 spray into both nostrils 2 (two) times daily. 02/13/21  Yes Volney American, PA-C  ondansetron (ZOFRAN ODT) 4 MG disintegrating tablet Take 1 tablet (4 mg total) by mouth every 8 (eight) hours as needed for nausea or vomiting. 02/13/21  Yes Volney American, PA-C  SUMAtriptan (IMITREX) 50 MG tablet Take one tab at onset of headache. May repeat in 2 hours if headache persists or recurs. Max of 2 tabs daily 02/13/21  Yes Volney American, PA-C    Family History Family History  Family history unknown: Yes    Social History Social History   Tobacco Use   Smoking status: Never   Smokeless tobacco: Never  Vaping Use    Vaping Use: Never used  Substance Use Topics   Alcohol use: Yes    Comment: socially     Allergies   Patient has no known allergies.   Review of Systems Review of Systems Per HPI  Physical Exam Triage Vital Signs ED Triage Vitals  Enc Vitals Group     BP 02/13/21 1232 130/86     Pulse Rate 02/13/21 1232 64     Resp 02/13/21 1232 18     Temp 02/13/21 1232 98.1 F (36.7 C)     Temp src --      SpO2 02/13/21 1232 99 %     Weight --      Height --      Head Circumference --      Peak Flow --      Pain Score 02/13/21 1230 6     Pain Loc --      Pain Edu? --      Excl. in [place]? --    No data found.  Updated Vital Signs BP 130/86   Pulse 64   Temp 98.1 F (36.7 C)   Resp 18   SpO2 99%   Visual Acuity Right Eye Distance:   Left Eye Distance:   Bilateral Distance:    Right Eye Near:   Left Eye Near:    Bilateral Near:     Physical Exam Vitals and nursing note reviewed.  Constitutional:  Appearance: Normal appearance.  HENT:     Head: Atraumatic.     Right Ear: Tympanic membrane normal.     Left Ear: Tympanic membrane normal.     Nose:     Comments: Bilateral nasal turbinates boggy, erythematous, edematous    Mouth/Throat:     Mouth: Mucous membranes are moist.  Eyes:     Extraocular Movements: Extraocular movements intact.     Conjunctiva/sclera: Conjunctivae normal.  Cardiovascular:     Rate and Rhythm: Normal rate and regular rhythm.     Heart sounds: Normal heart sounds.  Pulmonary:     Effort: Pulmonary effort is normal.     Breath sounds: Normal breath sounds.  Abdominal:     General: Bowel sounds are normal. There is no distension.     Palpations: Abdomen is soft.     Tenderness: There is no abdominal tenderness. There is no right CVA tenderness, left CVA tenderness or guarding.  Musculoskeletal:        General: Normal range of motion.     Cervical back: Normal range of motion and neck supple.  Skin:    General: Skin is warm and dry.      Findings: No bruising or erythema.  Neurological:     General: No focal deficit present.     Mental Status: He is oriented to person, place, and time.     Cranial Nerves: No cranial nerve deficit.     Motor: No weakness.     Gait: Gait normal.  Psychiatric:        Mood and Affect: Mood normal.        Thought Content: Thought content normal.        Judgment: Judgment normal.     UC Treatments / Results  Labs (all labs ordered are listed, but only abnormal results are displayed) Labs Reviewed - No data to display  EKG   Radiology No results found.  Procedures Procedures (including critical care time)  Medications Ordered in UC Medications  ketorolac (TORADOL) 30 MG/ML injection 30 mg (30 mg Intramuscular Given 02/13/21 1317)  metoCLOPramide (REGLAN) injection 5 mg (5 mg Intramuscular Given 02/13/21 1317)  dexamethasone (DECADRON) injection 10 mg (10 mg Intramuscular Given 02/13/21 1318)    Initial Impression / Assessment and Plan / UC Course  I have reviewed the triage vital signs and the nursing notes.  Pertinent labs & imaging results that were available during my care of the patient were reviewed by me and considered in my medical decision making (see chart for details).     Exam vitals benign and reassuring, no focal neurologic deficits noted and symptoms are intermittent and controlled by over-the-counter pain relievers for the most part.  Possibly sinus headache versus migraine.  We will treat with migraine cocktail in clinic, sent home with, Imitrex for as needed use and trial nasal spray in case sinus component. PCP resources given for f/u, ED for worsening sxs at any time.    Final Clinical Impressions(s) / UC Diagnoses   Final diagnoses:  Nonintractable episodic headache, unspecified headache type  Non-intractable vomiting with nausea, unspecified vomiting type   Discharge Instructions   None    ED Prescriptions     Medication Sig Dispense Auth.  Provider   SUMAtriptan (IMITREX) 50 MG tablet Take one tab at onset of headache. May repeat in 2 hours if headache persists or recurs. Max of 2 tabs daily 10 tablet Volney American, PA-C   ondansetron (ZOFRAN ODT) 4 MG  disintegrating tablet Take 1 tablet (4 mg total) by mouth every 8 (eight) hours as needed for nausea or vomiting. 20 tablet Volney American, PA-C   budesonide [facility] ALLERGY) 32 MCG/ACT nasal spray Place 1 spray into both nostrils 2 (two) times daily. 8.43 g Volney American, [place]      PDMP not reviewed this encounter.   Volney American, [place] 02/13/21 1343

## 2021-02-13 NOTE — ED Triage Notes (Addendum)
Pt reports HA for 3 weeks . Pt reports when he looks up he has a pulsating HA. Pt also has been vomiting fo 2 weeks. Pt reports he played foot ball during college last 2019 . No Hx of concussion.

## 2021-02-14 DIAGNOSIS — C716 Malignant neoplasm of cerebellum: Secondary | ICD-10-CM

## 2021-02-14 HISTORY — DX: Malignant neoplasm of cerebellum: C71.6

## 2021-02-22 ENCOUNTER — Encounter (HOSPITAL_COMMUNITY): Payer: Self-pay

## 2021-02-22 ENCOUNTER — Ambulatory Visit (HOSPITAL_COMMUNITY)
Admission: RE | Admit: 2021-02-22 | Discharge: 2021-02-22 | Disposition: A | Discharge status: Home / Self Care | Payer: BLUE CROSS/BLUE SHIELD | Source: Ambulatory Visit | Attending: Student | Admitting: Student

## 2021-02-22 ENCOUNTER — Other Ambulatory Visit: Payer: Self-pay

## 2021-02-22 VITALS — BP 132/78 | HR 65 | Temp 98.2°F | Resp 17

## 2021-02-22 DIAGNOSIS — G43701 Chronic migraine without aura, not intractable, with status migrainosus: Secondary | ICD-10-CM

## 2021-02-22 MED ORDER — SUMATRIPTAN SUCCINATE 50 MG PO TABS: ORAL_TABLET | ORAL | 0 refills | Status: DC

## 2021-02-22 NOTE — ED Triage Notes (Signed)
Pt presents with ongoing headache and nausea that is temporarily relieved with prescribed medication

## 2021-02-22 NOTE — Discharge Instructions (Addendum)
-  Imitrex as needed -Excedrin  -Scheduled appointment with neurologist -Worst headache of life- head to ED

## 2021-02-22 NOTE — ED Provider Notes (Signed)
[facility]    CSN: [id number] Arrival date & time: 02/22/21  1142      History   Chief Complaint Chief Complaint  Patient presents with   APPOINTMENT: Headache, Nausea    HPI Zameer Awwad is a 25 y.o. male presenting with migraine headache.  Medical history migraines.  States that he has continued to have regular migraine headaches since his last visit with [place] about 6 months ago.  They are relieved by Imitrex but then returned.  States he has had a migraine almost every day for the last month.  Tried to follow-up with neurology but they cannot get him in for another 3 months.  Today endorses throbbing headache behind forehead with nausea but no vomiting.  Symptoms consistent with past migraines. Denies worst headache of life, thunderclap headache, weakness/sensation changes in arms/legs, vision changes, shortness of breath, chest pain/pressure, photophobia, phonophobia.   HPI  History reviewed. No pertinent past medical history.  Patient Active Problem List   Diagnosis Date Noted   Acute renal failure ([place])    Rhabdomyolysis 07/11/2017   AKI (acute kidney injury) ([place]) 07/11/2017   Dehydration 07/11/2017   Nausea and vomiting 07/11/2017   Hypokalemia 07/11/2017    History reviewed. No pertinent surgical history.     Home Medications    Prior to Admission medications   Medication Sig Start Date End Date Taking? Authorizing Provider  budesonide (RHINOCORT ALLERGY) 32 MCG/ACT nasal spray Place 1 spray into both nostrils 2 (two) times daily. 02/13/21   Volney American, PA-C  ondansetron (ZOFRAN ODT) 4 MG disintegrating tablet Take 1 tablet (4 mg total) by mouth every 8 (eight) hours as needed for nausea or vomiting. 02/13/21   Volney American, PA-C  SUMAtriptan (IMITREX) 50 MG tablet Take one tab at onset of headache. May repeat in 2 hours if headache persists or recurs. Max of 2 tabs daily 02/22/21   Hazel Sams PA-C    Family History Family  History  Family history unknown: Yes    Social History Social History   Tobacco Use   Smoking status: Never   Smokeless tobacco: Never  Vaping Use   Vaping Use: Never used  Substance Use Topics   Alcohol use: Yes    Comment: socially     Allergies   Patient has no known allergies.   Review of Systems Review of Systems  Neurological:  Positive for headaches.  All other systems reviewed and are negative.   Physical Exam Triage Vital Signs ED Triage Vitals  Enc Vitals Group     BP 02/22/21 1217 132/78     Pulse Rate 02/22/21 1217 65     Resp 02/22/21 1217 17     Temp 02/22/21 1217 98.2 F (36.8 C)     Temp Source 02/22/21 1217 Oral     SpO2 02/22/21 1217 95 %     Weight --      Height --      Head Circumference --      Peak Flow --      Pain Score 02/22/21 1216 6     Pain Loc --      Pain Edu? --      Excl. in [place]? --    No data found.  Updated Vital Signs BP 132/78 (BP Location: Right Arm)   Pulse 65   Temp 98.2 F (36.8 C) (Oral)   Resp 17   SpO2 95%   Visual Acuity Right Eye Distance:  Left Eye Distance:   Bilateral Distance:    Right Eye Near:   Left Eye Near:    Bilateral Near:     Physical Exam Vitals reviewed.  Constitutional:      General: He is not in acute distress.    Appearance: Normal appearance. He is not ill-appearing.  HENT:     Head: Normocephalic and atraumatic.  Eyes:     Extraocular Movements: Extraocular movements intact.     Pupils: Pupils are equal, round, and reactive to light.  Cardiovascular:     Rate and Rhythm: Normal rate and regular rhythm.     Heart sounds: Normal heart sounds.  Pulmonary:     Effort: Pulmonary effort is normal.     Breath sounds: Normal breath sounds. No wheezing, rhonchi or rales.  Musculoskeletal:     Cervical back: Normal range of motion and neck supple. No rigidity.  Lymphadenopathy:     Cervical: No cervical adenopathy.  Skin:    Capillary Refill: Capillary refill takes less than  2 seconds.  Neurological:     General: No focal deficit present.     Mental Status: He is alert and oriented to person, place, and time. Mental status is at baseline.     Cranial Nerves: Cranial nerves are intact. No cranial nerve deficit or facial asymmetry.     Sensory: Sensation is intact. No sensory deficit.     Motor: Motor function is intact. No weakness.     Coordination: Coordination is intact. Coordination normal.     Gait: Gait is intact. Gait normal.     Comments: PERRLA, EOMI. CN 2-12 intact. No weakness or numbness in UEs or LEs. Negative rhomberg, pronator drift, fingers to thumb.  Psychiatric:        Mood and Affect: Mood normal.        Behavior: Behavior normal.        Thought Content: Thought content normal.        Judgment: Judgment normal.     UC Treatments / Results  Labs (all labs ordered are listed, but only abnormal results are displayed) Labs Reviewed - No data to display  EKG   Radiology No results found.  Procedures Procedures (including critical care time)  Medications Ordered in UC Medications - No data to display  Initial Impression / Assessment and Plan / UC Course  I have reviewed the triage vital signs and the nursing notes.  Pertinent labs & imaging results that were available during my care of the patient were reviewed by me and considered in my medical decision making (see chart for details).     This patient is a very pleasant 25 y.o. year old male presenting with continued migraine headaches. No red flag symptoms or worst headache of life. Refilled imitrex. F/u with neurology. ED return precautions discussed. Patient verbalizes understanding and agreement.  .   Final Clinical Impressions(s) / UC Diagnoses   Final diagnoses:  Chronic migraine without aura with status migrainosus, not intractable     Discharge Instructions      -Imitrex as needed -Excedrin  -Scheduled appointment with neurologist -Worst headache of life-  head to ED   ED Prescriptions     Medication Sig Dispense Auth. Provider   SUMAtriptan (IMITREX) 50 MG tablet Take one tab at onset of headache. May repeat in 2 hours if headache persists or recurs. Max of 2 tabs daily 20 tablet Hazel Sams, PA-C      PDMP not reviewed this encounter.  Hazel Sams, PA-C 02/22/21 1256

## 2021-03-06 ENCOUNTER — Emergency Department (HOSPITAL_COMMUNITY): Payer: BLUE CROSS/BLUE SHIELD

## 2021-03-06 ENCOUNTER — Encounter (HOSPITAL_COMMUNITY): Payer: Self-pay

## 2021-03-06 ENCOUNTER — Inpatient Hospital Stay (HOSPITAL_COMMUNITY)
Admission: EM | Admit: 2021-03-06 | Discharge: 2021-03-16 | DRG: 026 | Disposition: A | Discharge status: Home / Self Care | Payer: BLUE CROSS/BLUE SHIELD | Attending: Neurological Surgery | Admitting: Neurological Surgery

## 2021-03-06 ENCOUNTER — Other Ambulatory Visit: Payer: Self-pay

## 2021-03-06 DIAGNOSIS — C716 Malignant neoplasm of cerebellum: Secondary | ICD-10-CM | POA: Diagnosis present

## 2021-03-06 DIAGNOSIS — R739 Hyperglycemia, unspecified: Secondary | ICD-10-CM | POA: Diagnosis present

## 2021-03-06 DIAGNOSIS — R519 Headache, unspecified: Secondary | ICD-10-CM | POA: Diagnosis not present

## 2021-03-06 DIAGNOSIS — R279 Unspecified lack of coordination: Secondary | ICD-10-CM

## 2021-03-06 DIAGNOSIS — Z20822 Contact with and (suspected) exposure to covid-19: Secondary | ICD-10-CM | POA: Diagnosis present

## 2021-03-06 DIAGNOSIS — G911 Obstructive hydrocephalus: Secondary | ICD-10-CM | POA: Diagnosis present

## 2021-03-06 DIAGNOSIS — D496 Neoplasm of unspecified behavior of brain: Secondary | ICD-10-CM | POA: Diagnosis present

## 2021-03-06 DIAGNOSIS — T380X5A Adverse effect of glucocorticoids and synthetic analogues, initial encounter: Secondary | ICD-10-CM | POA: Diagnosis present

## 2021-03-06 DIAGNOSIS — D499 Neoplasm of unspecified behavior of unspecified site: Secondary | ICD-10-CM

## 2021-03-06 DIAGNOSIS — E871 Hypo-osmolality and hyponatremia: Secondary | ICD-10-CM | POA: Diagnosis present

## 2021-03-06 DIAGNOSIS — R112 Nausea with vomiting, unspecified: Secondary | ICD-10-CM | POA: Diagnosis not present

## 2021-03-06 DIAGNOSIS — R269 Unspecified abnormalities of gait and mobility: Secondary | ICD-10-CM

## 2021-03-06 DIAGNOSIS — G919 Hydrocephalus, unspecified: Secondary | ICD-10-CM

## 2021-03-06 DIAGNOSIS — R110 Nausea: Secondary | ICD-10-CM

## 2021-03-06 DIAGNOSIS — G9389 Other specified disorders of brain: Secondary | ICD-10-CM

## 2021-03-06 DIAGNOSIS — R001 Bradycardia, unspecified: Secondary | ICD-10-CM | POA: Diagnosis not present

## 2021-03-06 DIAGNOSIS — R270 Ataxia, unspecified: Secondary | ICD-10-CM | POA: Diagnosis present

## 2021-03-06 LAB — COMPREHENSIVE METABOLIC PANEL
ALT: 15 U/L (ref 0–44)
AST: 17 U/L (ref 15–41)
Albumin: 5.1 g/dL — ABNORMAL HIGH (ref 3.5–5.0)
Alkaline Phosphatase: 44 U/L (ref 38–126)
Anion gap: 8 (ref 5–15)
BUN: 11 mg/dL (ref 6–20)
CO2: 28 mmol/L (ref 22–32)
Calcium: 9.9 mg/dL (ref 8.9–10.3)
Chloride: 101 mmol/L (ref 98–111)
Creatinine, Ser: 0.93 mg/dL (ref 0.61–1.24)
GFR, Estimated: >60 mL/min (ref >60)
Glucose, Bld: 111 mg/dL — ABNORMAL HIGH (ref 70–99)
Potassium: 3.9 mmol/L (ref 3.5–5.1)
Sodium: 137 mmol/L (ref 135–145)
Total Bilirubin: 1 mg/dL (ref 0.3–1.2)
Total Protein: 8.5 g/dL — ABNORMAL HIGH (ref 6.5–8.1)

## 2021-03-06 LAB — CBC WITH DIFFERENTIAL/PLATELET
Abs Immature Granulocytes: 0.05 10*3/uL (ref 0.00–0.07)
Basophils Absolute: 0 10*3/uL (ref 0.0–0.1)
Basophils Relative: 0 %
Eosinophils Absolute: 0 10*3/uL (ref 0.0–0.5)
Eosinophils Relative: 0 %
HCT: 46.8 % (ref 39.0–52.0)
Hemoglobin: 16.1 g/dL (ref 13.0–17.0)
Immature Granulocytes: 1 %
Lymphocytes Relative: 16 %
Lymphs Abs: 1.6 10*3/uL (ref 0.7–4.0)
MCH: 30.4 pg (ref 26.0–34.0)
MCHC: 34.4 g/dL (ref 30.0–36.0)
MCV: 88.5 fL (ref 80.0–100.0)
Monocytes Absolute: 0.5 10*3/uL (ref 0.1–1.0)
Monocytes Relative: 5 %
Neutro Abs: 7.7 10*3/uL (ref 1.7–7.7)
Neutrophils Relative %: 78 %
Platelets: 308 10*3/uL (ref 150–400)
RBC: 5.29 MIL/uL (ref 4.22–5.81)
RDW: 11.4 % — ABNORMAL LOW (ref 11.5–15.5)
WBC: 9.8 10*3/uL (ref 4.0–10.5)
nRBC: 0 % (ref 0.0–0.2)

## 2021-03-06 IMAGING — MR MR HEAD WO/W CM
13 series · 48 of 48 positions shown · IV contrast (gadavist)
Comparison: CT same day

CLINICAL DATA: Headache with abnormal CT

EXAM:
MRI HEAD WITHOUT AND WITH CONTRAST
TECHNIQUE: Multiplanar, multiecho pulse sequences of the brain and surrounding
structures were obtained without and with intravenous contrast.
CONTRAST:  9mL GADAVIST GADOBUTROL 1 MMOL/ML IV SOLN

[Series 5: DWI · axial · 3.0mm · 1.36mm/px · z∈[-97,+68]mm · 6 of 112 slices shown (1 of 2)]
[im 1/112]
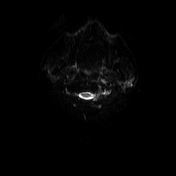
[im 23/112]
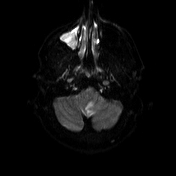
[im 45/112]
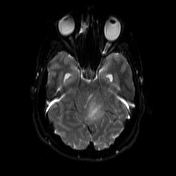
[im 67/112]
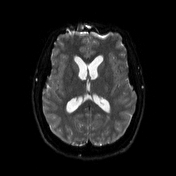
[im 89/112]
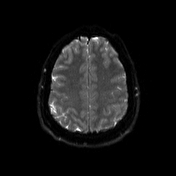
[im 112/112]
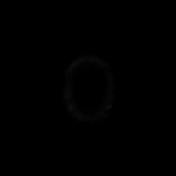

[Series 6: DWI · axial · 3.0mm · 1.36mm/px · z∈[-97,+68]mm · 3 of 56 slices shown (2 of 2)]
[im 1/56]
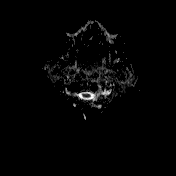
[im 28/56]
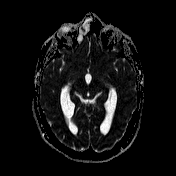
[im 56/56]
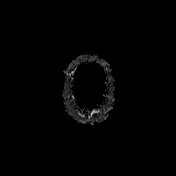

[Series 7: T1 · sagittal · 5.0mm · 0.75mm/px · 1 of 26 slices shown (1 of 2)]
[im 1/26]
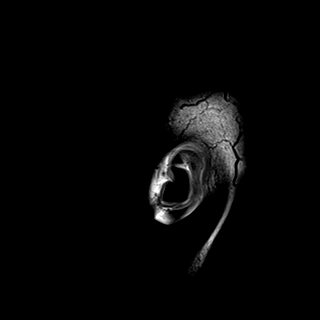

[Series 8: T2 · axial · 5.0mm · 0.62mm/px · z∈[-97,+71]mm · 2 of 27 slices shown]
[im 1/27]
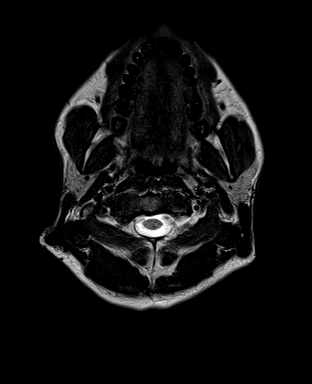
[im 27/27]
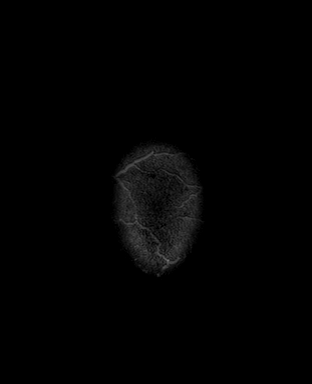

[Series 9: swi_images · axial · 3.0mm · 0.75mm/px · z∈[-95,+70]mm · 3 of 56 slices shown]
[im 1/56]
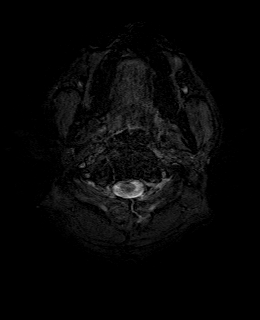
[im 28/56]
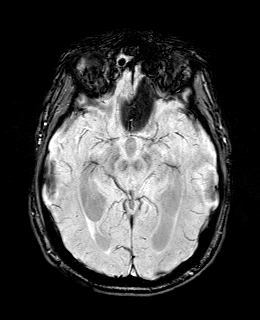
[im 56/56]
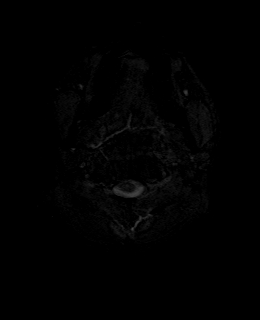

[Series 11: FLAIR · axial · 3.0mm · 0.75mm/px · z∈[-96,+72]mm · 3 of 57 slices shown]
[im 1/57]
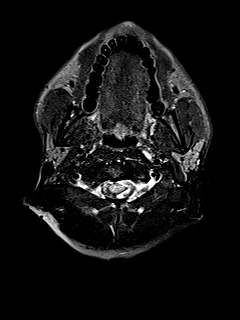
[im 29/57]
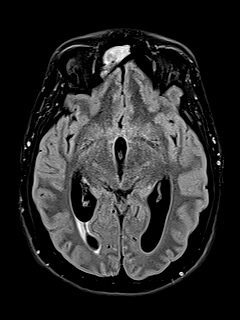
[im 57/57]
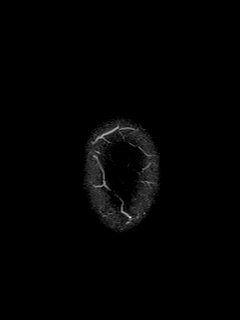

[Series 12: T1 · axial · 1.0mm · 0.94mm/px · z∈[-96,+78]mm · 10 of 176 slices shown (2 of 2)]
[im 1/176]
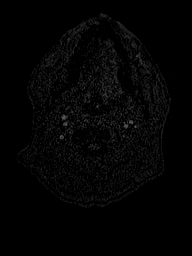
[im 20/176]
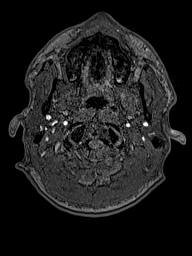
[im 39/176]
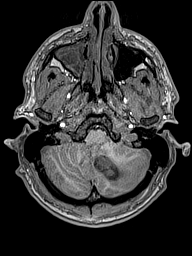
[im 59/176]
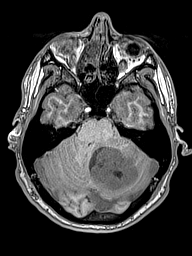
[im 78/176]
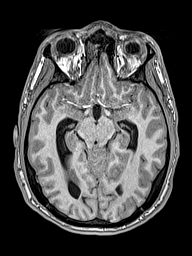
[im 98/176]
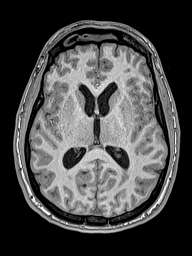
[im 117/176]
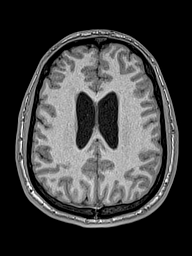
[im 137/176]
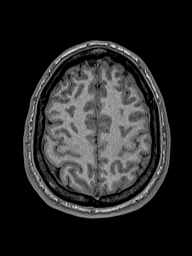
[im 156/176]
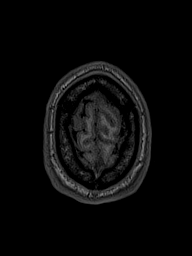
[im 176/176]
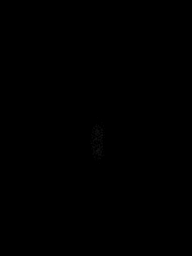

[Series 13: cor dwi_tracew · coronal · 5.0mm · 1.53mm/px · 3 of 58 slices shown]
[im 1/58]
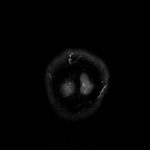
[im 29/58]
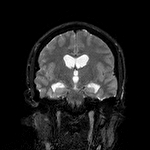
[im 58/58]
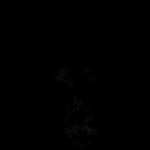

[Series 14: cor dwi_adc · coronal · 5.0mm · 1.53mm/px · 2 of 29 slices shown]
[im 1/29]
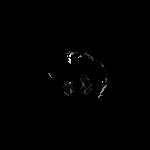
[im 29/29]
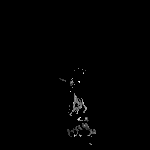

[Series 15: T2 post-contrast · coronal · 5.0mm · 0.57mm/px · 2 of 30 slices shown]
[im 1/30]
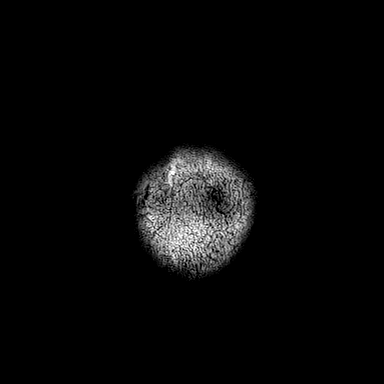
[im 30/30]
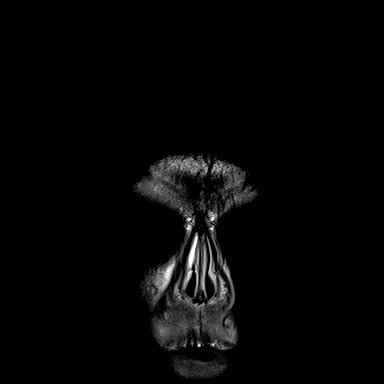

[Series 16: T1 post-contrast · axial · 1.0mm · 0.94mm/px · z∈[-96,+78]mm · 10 of 176 slices shown (1 of 3)]
[im 1/176]
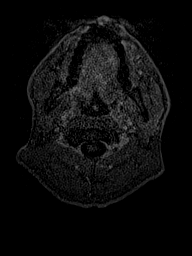
[im 20/176]
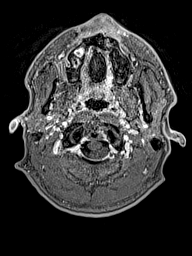
[im 39/176]
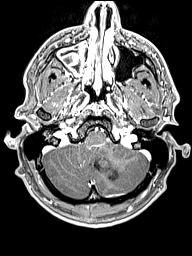
[im 59/176]
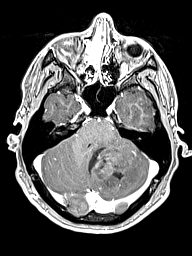
[im 78/176]
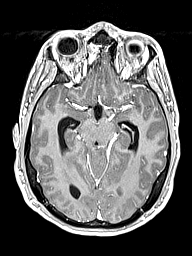
[im 98/176]
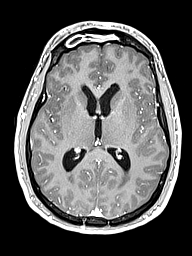
[im 117/176]
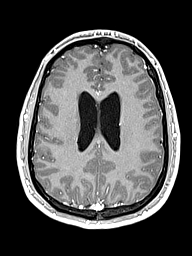
[im 137/176]
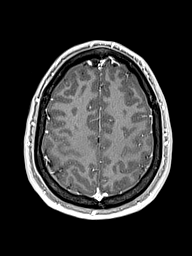
[im 156/176]
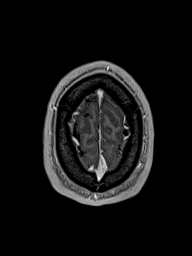
[im 176/176]
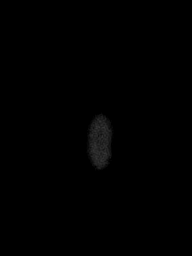

[Series 17: T1 post-contrast · coronal · 5.0mm · 0.43mm/px · 2 of 30 slices shown (2 of 3)]
[im 1/30]
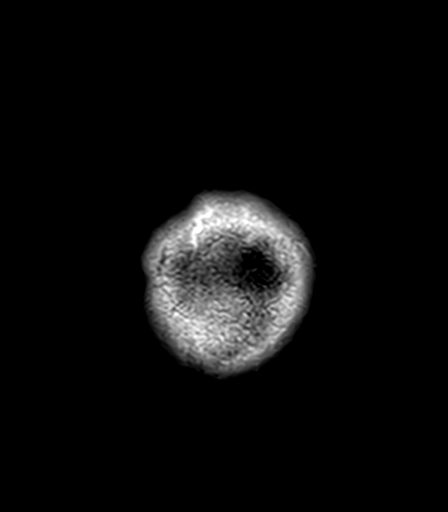
[im 30/30]
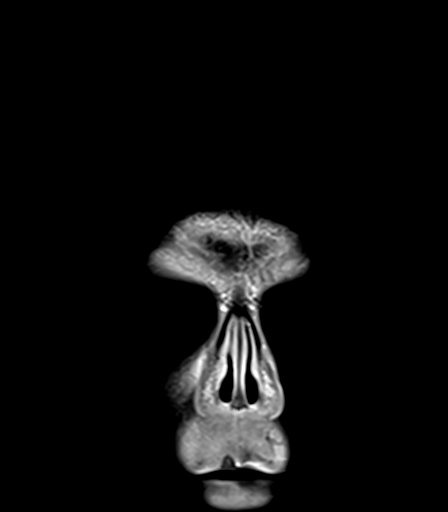

[Series 18: T1 post-contrast · sagittal · 5.0mm · 0.75mm/px · 1 of 26 slices shown (3 of 3)]
[im 1/26]
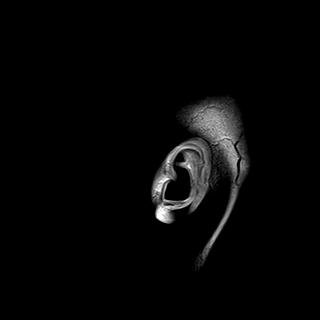

[48 of 48 positions shown; findings below may reference images not displayed]

FINDINGS: Brain: Arising in the left cerebellar hemisphere, there is a cystic
and solid mass lesion measuring 5.4 x 3.6 x 3.5 cm with mass-effect
upon the fourth ventricle and obstructive hydrocephalus of the
lateral and third ventricles. The solid component measures 4 x 3.5 x
3 cm and occupies the majority of the lesion. The solid component
shows relative restricted diffusion. There is low level enhancement
in portions of the solid component, but there is no intense
enhancement. There is relatively little vasogenic edema within the
left cerebellum. There is mass-effect upon the brainstem and there
is cerebellar tonsillar extension through the foramen magnum of 11
mm. Cerebral hemispheres are intrinsically normal. There is some
subependymal resorption of CSF.

Vascular: Major vessels at the base of the brain show flow.

Skull and upper cervical spine: Negative

Sinuses/Orbits: Inflammatory changes of the paranasal sinuses with
complete opacification of the right maxillary, ethmoid and frontal
sinuses.

Other: None
IMPRESSION: 5.4 x 3.6 x 3.5 cm mass lesion within the left cerebellar
hemisphere, primarily solid but with cystic components, with
mass-effect upon the fourth ventricle resulting in obstructive
hydrocephalus of the lateral and third ventricles with subependymal
resorption of CSF. Notably, the solid portions of the lesion show
only low level enhancement. The lesion definitely enhances less than
typical medulloblastoma or astrocytoma. I think the differential
diagnosis would include a somewhat atypical medulloblastoma or
astrocytoma, ependymoma, and atypical dysplastic cerebellar
gangliocytoma (MICHIKO).

## 2021-03-06 IMAGING — CT CT ABD-PELV W/ CM
2 of 4 series · 13 of 36 positions shown, 16 images · IV contrast (OMNIPAQUE)
Comparison: None.

CLINICAL DATA: Cancer of unknown primary.  Brain cancer.

EXAM:
CT CHEST, ABDOMEN, AND PELVIS WITH CONTRAST
TECHNIQUE: Multidetector CT imaging of the chest, abdomen and pelvis was
performed following the standard protocol during bolus
administration of intravenous contrast.
CONTRAST:  80mL OMNIPAQUE IOHEXOL 350 MG/ML SOLN

[Series 2: cap with · axial · 0.86mm/px · z∈[+989,+1594]mm · 10 of 147 slices shown, 13 images]
[im 13/147  mediastinal]
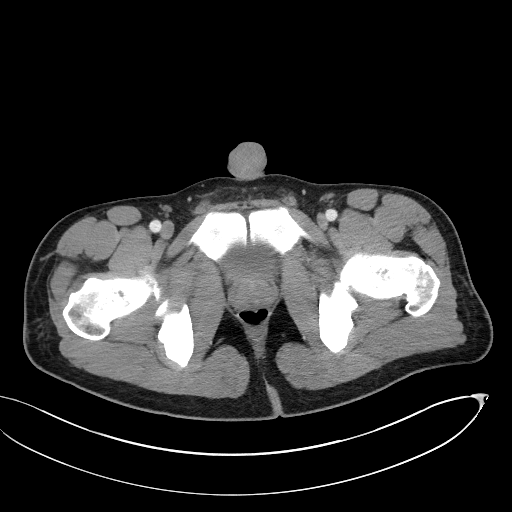
[im 13/147  lung]
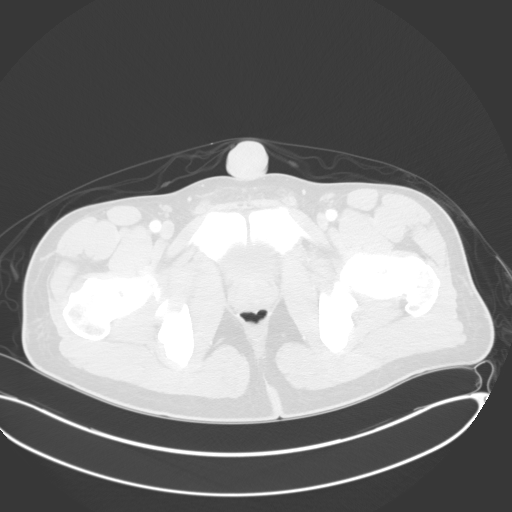
[im 25/147  lung]
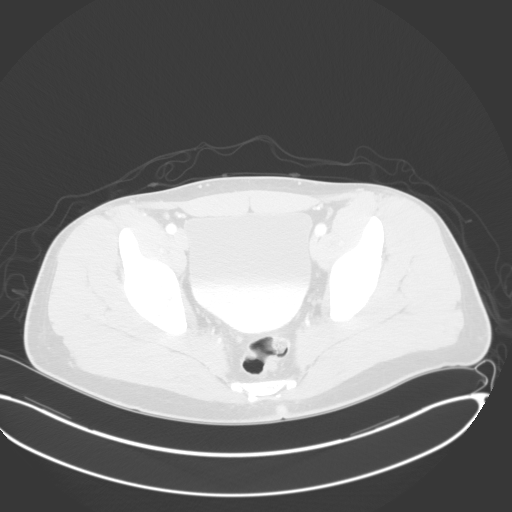
[im 37/147  lung]
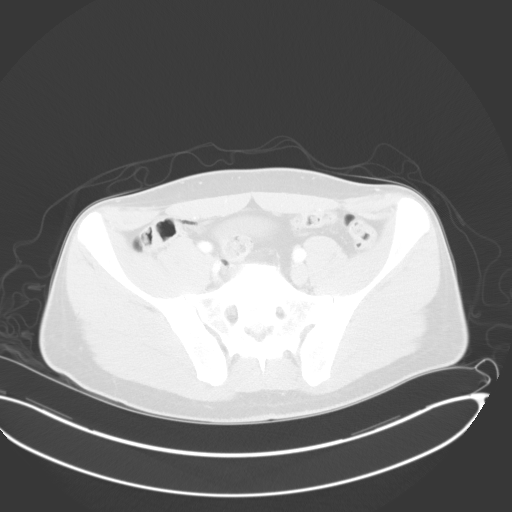
[im 49/147  lung]
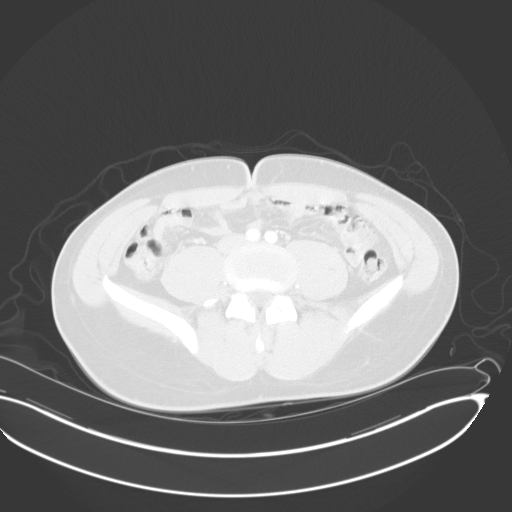
[im 61/147  mediastinal]
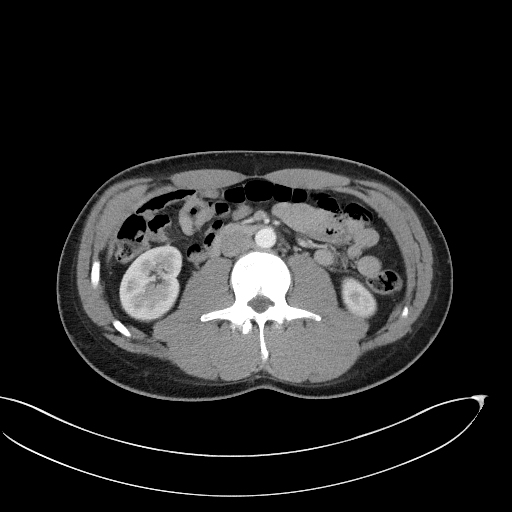
[im 61/147  lung]
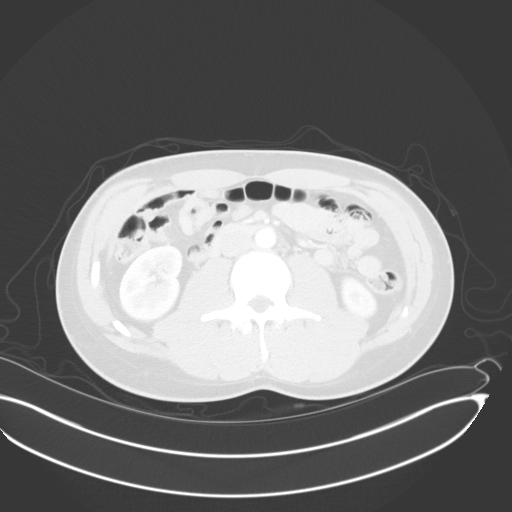
[im 86/147  lung]
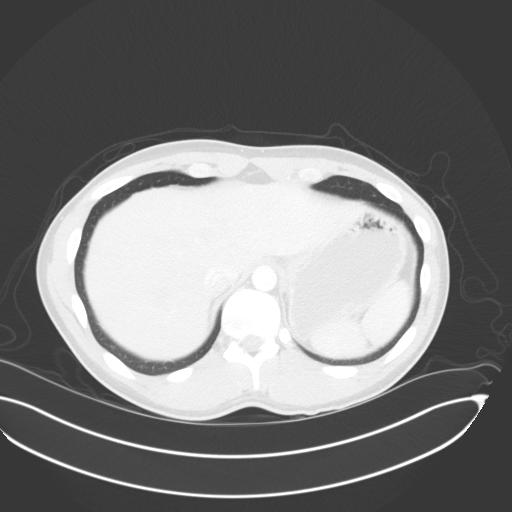
[im 98/147  lung]
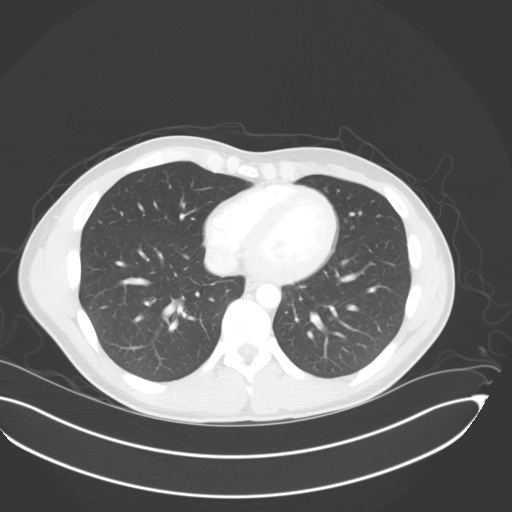
[im 110/147  lung]
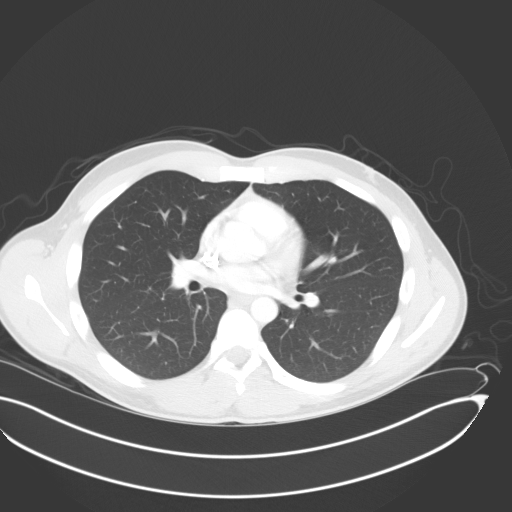
[im 122/147  mediastinal]
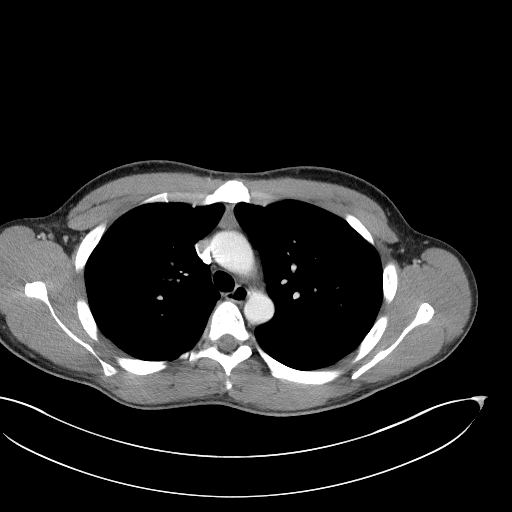
[im 122/147  lung]
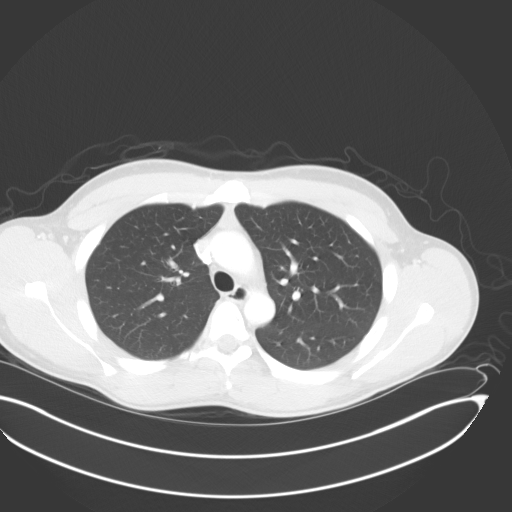
[im 134/147  lung]
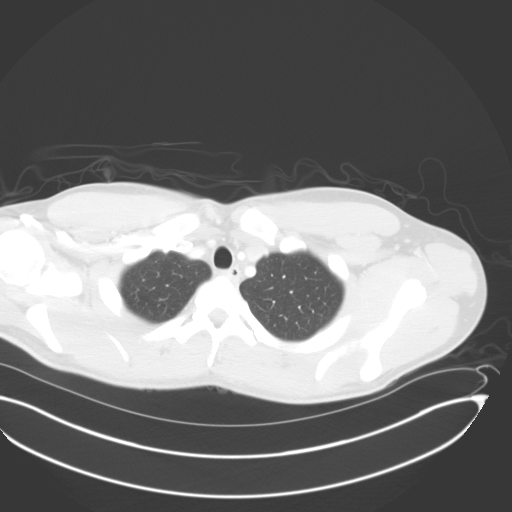

[Series 4: coronals · coronal · 0.88mm/px · 3 of 145 slices shown]
[im 29/145  lung]
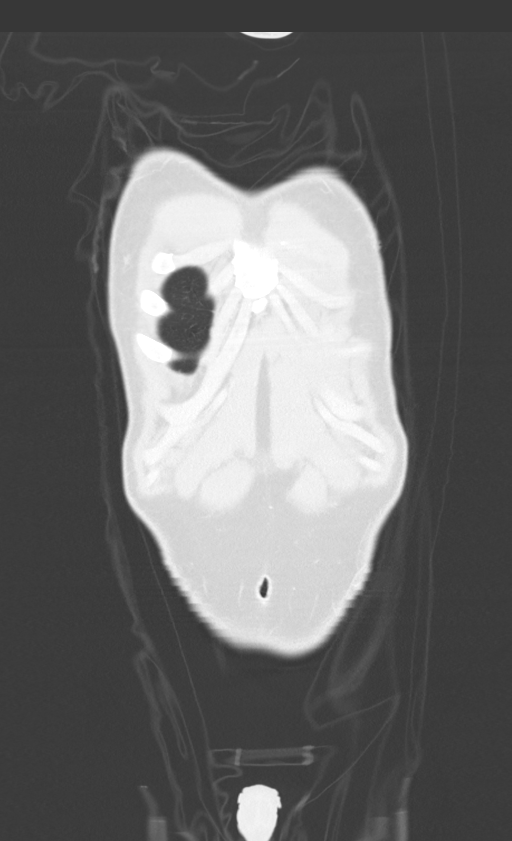
[im 58/145  lung]
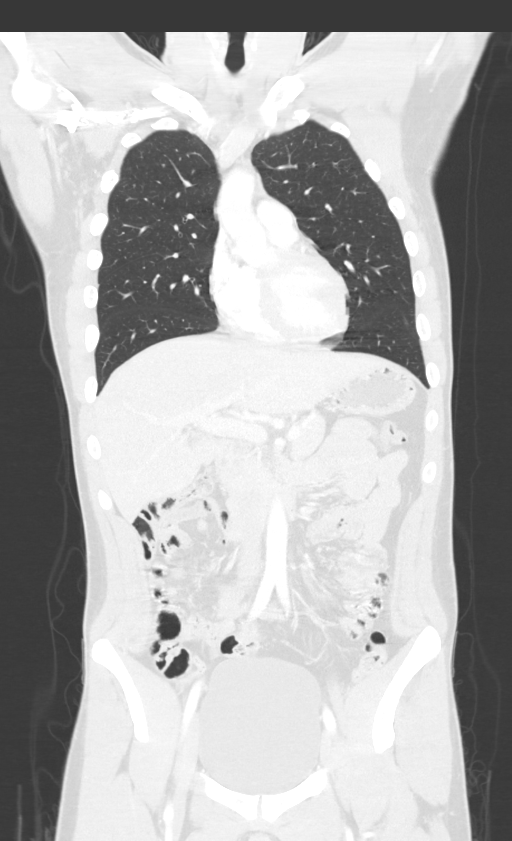
[im 87/145  lung]
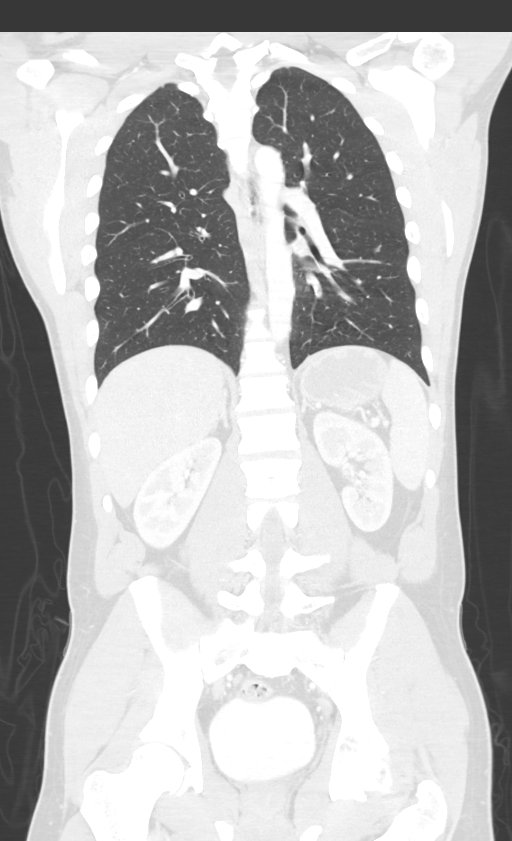

[13 of 36 positions shown; findings below may reference images not displayed]

FINDINGS: CT CHEST FINDINGS

Cardiovascular: No acute vascular findings. Normal caliber thoracic
aorta. No central pulmonary embolus to the lobar level on this non
dedicated CTA exam. The heart is normal in size. No pericardial
effusion or thickening.

Mediastinum/Nodes: No enlarged mediastinal, hilar, or axillary lymph
nodes. No esophageal wall thickening. No thyroid nodule.

Lungs/Pleura: Clear lungs. No pulmonary nodule or mass. No focal
airspace disease. No pleural fluid or thickening. Trachea and
central bronchi are patent.

Musculoskeletal: No lytic or blastic osseous lesions. No acute
osseous findings. No obvious intraspinal abnormality on CT.
Unremarkable chest wall soft tissues.

CT ABDOMEN PELVIS FINDINGS

Hepatobiliary: No focal hepatic lesion or abnormality. Normal liver
size and appearance. Normal gallbladder. No biliary dilatation.

Pancreas: Unremarkable. No pancreatic ductal dilatation or
surrounding inflammatory changes. No pancreatic mass.

Spleen: Splenic cleft.  Normal in size.  No focal lesion.

Adrenals/Urinary Tract: No adrenal nodule. No focal renal
abnormality or mass. Homogeneous renal enhancement. No
hydronephrosis. Early excretion of IV contrast in the renal
collecting systems, no obvious renal stone. Unremarkable urinary
bladder. Layering high-density partially obscures assessment.

Stomach/Bowel: Bowel assessment is limited in the absence of enteric
contrast. Unremarkable appearance of the stomach. No small bowel
mass, obstruction, or inflammation. Normal appendix. Small volume of
colonic stool. No visualized colonic mass.

Vascular/Lymphatic: Normal caliber abdominal aorta. Patent portal
vein. No abdominal, retroperitoneal, mesenteric, or pelvic
adenopathy. No enlarged inguinal nodes.

Reproductive: Unremarkable prostate gland. Majority of the scrotum
is not included in the field of view. There may be a calcification
in the upper right hemiscrotum, partially included.

Other: No ascites or free fluid. No free air. No omental thickening.
Unremarkable body wall soft tissues.

Musculoskeletal: No lytic or blastic osseous lesion. Tiny bone
islands in the left femoral head. 1 No obvious intraspinal
abnormality on CT.
IMPRESSION: 1. No evidence of primary malignancy or metastatic disease in the
chest, abdomen, or pelvis.
2. Majority of the scrotum is not included in the field of view.
There may be a calcification in the upper right hemiscrotum,
partially included. Consider scrotal ultrasound for testicular
assessment.

## 2021-03-06 MED ORDER — GADOBUTROL 1 MMOL/ML IV SOLN
9.0000 mL | Freq: Once | INTRAVENOUS | Status: AC | PRN
  Administered 2021-03-06: 9 mL via INTRAVENOUS

## 2021-03-06 MED ORDER — IOHEXOL 350 MG/ML SOLN
80.0000 mL | Freq: Once | INTRAVENOUS | Status: AC | PRN
  Administered 2021-03-06: 80 mL via INTRAVENOUS

## 2021-03-06 NOTE — ED Triage Notes (Signed)
Pt presents with c/o ongoing headaches with n/v since the 2nd week of August. Pt reports no prior hx of migraines. Pt reports all of this started after he received a TB shot the end of July.

## 2021-03-06 NOTE — ED Provider Notes (Addendum)
Emergency Medicine Provider Triage Evaluation Note  Scott Snyder , a 25 y.o. male  was evaluated in triage.  Pt complains of headaches.  Patient states he had a TB test in late July which was negative.  About 2 weeks later he states he started experiencing intermittent frontal headaches.  They occur on daily basis.  No prior history of migraines.  Reports nausea/vomiting when his pain worsens.  Also complains of a developing tremor in the left hand when gripping objects. Denies any visual changes, numbness, weakness.  Physical Exam  BP (!) 156/85 (BP Location: Left Arm)   Pulse 77   Temp 97.9 F (36.6 C) (Oral)   Resp 15   SpO2 98%  Gen:   Awake, no distress   Resp:  Normal effort  MSK:   Moves extremities without difficulty  Other:    Medical Decision Making  Medically screening exam initiated at 1:21 PM.  Appropriate orders placed.  Ikey Omary was informed that the remainder of the evaluation will be completed by another provider, this initial triage assessment does not replace that evaluation, and the importance of remaining in the ED until their evaluation is complete.   Rayna Sexton, PA-C 03/06/21 [id number]    Rayna Sexton, PA-C 27/74/12 [id number]    Campbell Stall P, DO 76/72/09 [phone]

## 2021-03-06 NOTE — H&P (Addendum)
History and Physical    Scott Snyder IFO:[id number] DOB: 12-16-1995 DOA: 03/06/2021  PCP: Patient, No Pcp Per (Inactive)   Patient coming from: Home  Chief Complaint: Headache associated with nausea and vomiting  HPI: Scott Snyder is a 25 y.o. male with no significant past medical history. He presents for valuation of headache associate with nausea and vomiting.  He reports for the last 2 to 3 months he has been having intermittent throbbing headaches that are mainly in the frontal region.  He states it feels like his brain is "pulsating".  He has been to urgent cares a few times in the last few months and has been given NSAIDs for his headache which initially helped.  He is also been prescribed Imitrex which did not help.  In the last 4 to 6 weeks he has developed incoordination and feeling very off balance when he walks but he has not had any falls.  He has not had any head injury or trauma.  He does have nausea and vomiting occasionally with the headache.  He has not had any fevers or chills.  He denies any abdominal pain, cough, testicular pain, testicular swelling, dysuria or urinary frequency.  He states that he occasionally has difficulty with his left hand gripping or holding things and he states that intermittently feels like his left index finger and thumb are cold and move slowly.  He has noticed an occasional tremor when he tries to use his left hand for anything but no resting tremor.  Tremor has not been constant.   ED Course: In the emergency room Mr. Corella has been hemodynamically stable.  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  Case was discussed with neurosurgery, Dr. Venetia Constable who stated patient need to be admitted to [facility] for further management.  He also recommended CT of the chest abdomen pelvis to make sure there is no other primary tumor.  CT shows no primary tumor but the entire scrotum cannot be evaluated so scrotal ultrasounds been ordered.  Hospital  service is excepted patient for admission and will be admitted to bed at [place] are unremarkable. Neurosurgery will be contacted to evaluate patient when he arrives in the bed there.  Review of Systems:  General: Denies weakness, fever, chills, weight loss, night sweats.  Denies dizziness.  Denies change in appetite HENT: Denies head trauma, headache, denies change in hearing, tinnitus.  Denies nasal congestion or bleeding.  Denies sore throat, sores in mouth.  Denies difficulty swallowing Eyes: Denies blurry vision, pain in eye, drainage.  Denies discoloration of eyes. Neck: Denies pain.  Denies swelling.  Denies pain with movement. Cardiovascular: Denies chest pain, palpitations.  Denies edema.  Denies orthopnea Respiratory: Denies shortness of breath, cough.  Denies wheezing.  Denies sputum production Gastrointestinal: Denies abdominal pain, swelling. Denies diarrhea.  Denies melena.  Denies hematemesis. Musculoskeletal: Denies limitation of movement.  Denies deformity or swelling Denies arthralgias or myalgias. Genitourinary: Denies pelvic pain.  Denies urinary frequency or hesitancy.  Denies dysuria. Denies testicular pain or swelling Skin: Denies rash.  Denies petechiae, purpura, ecchymosis. Neurological: Reports frontal throbbing headaches for past few months.  Denies syncope.  Denies seizure activity.  Denies paresthesia.  Denies slurred speech, drooping face.  Denies visual change. Has had intermittent tremor in left hand. Has had incoordination and being off balance when walks. Psychiatric: Denies depression, anxiety.  Denies hallucinations.  History reviewed. No pertinent past medical history.  History reviewed. No  pertinent surgical history.  Social History  reports that he has never smoked. He has never used smokeless tobacco. He reports current alcohol use. No history on file for drug use.  No Known Allergies  Family History  Family history unknown: Yes      Prior to Admission medications   Medication Sig Start Date End Date Taking? Authorizing Provider  budesonide (RHINOCORT ALLERGY) 32 MCG/ACT nasal spray Place 1 spray into both nostrils 2 (two) times daily. 02/13/21   Volney American, PA-C  ondansetron (ZOFRAN ODT) 4 MG disintegrating tablet Take 1 tablet (4 mg total) by mouth every 8 (eight) hours as needed for nausea or vomiting. 02/13/21   Volney American, PA-C  SUMAtriptan (IMITREX) 50 MG tablet Take one tab at onset of headache. May repeat in 2 hours if headache persists or recurs. Max of 2 tabs daily 02/22/21   Hazel Sams, PA-C    Physical Exam: Vitals:   03/06/21 1700 03/06/21 1715 03/06/21 1801 03/06/21 1915  BP: (!) 150/99  (!) 148/96 (!) 153/95  Pulse: 65 84 67 70  Resp: 18  18 17   Temp:      TempSrc:      SpO2: 96% 100% 96% 100%    Constitutional: NAD, calm, comfortable Vitals:   03/06/21 1700 03/06/21 1715 03/06/21 1801 03/06/21 1915  BP: (!) 150/99  (!) 148/96 (!) 153/95  Pulse: 65 84 67 70  Resp: 18  18 17   Temp:      TempSrc:      SpO2: 96% 100% 96% 100%   General: WDWN, Alert and oriented x3.  Eyes: EOMI, PERRL, conjunctivae normal.  Sclera nonicteric HENT:  [place]/AT, external ears normal. Mild serous otitis with normal light reflex. Nares patent without epistasis.  Mucous membranes are moist. Posterior pharynx clear of any exudate or lesions. Normal dentition.  Neck: Soft, normal range of motion, supple, no masses, Trachea midline Respiratory: clear to auscultation bilaterally, no wheezing, no crackles. Normal respiratory effort. No accessory muscle use.  Cardiovascular: Regular rate and rhythm, no murmurs / rubs / gallops. No extremity edema. 2+ pedal pulses. Abdomen: Soft, no tenderness, nondistended, no rebound or guarding.  No masses palpated. No hepatosplenomegaly. Bowel sounds normoactive Musculoskeletal: FROM. no cyanosis. No joint deformity upper and lower extremities. Normal muscle tone.   Skin: Warm, dry, intact no rashes, lesions, ulcers. No induration Neurologic: CN 2-12 grossly intact.  Normal speech.  Sensation intact, patella DTR +2 bilaterally. Strength 5/5 in all extremities. Grip strength equal. Negative SLR. No tremor.  Psychiatric: Normal judgment and insight.  Normal mood.    Labs on Admission: I have personally reviewed following labs and imaging studies  CBC: Recent Labs  Lab 03/06/21 1321  WBC 9.8  NEUTROABS 7.7  HGB 16.1  HCT 46.8  MCV 88.5  PLT 970    Basic Metabolic Panel: Recent Labs  Lab 03/06/21 1321  NA 137  K 3.9  CL 101  CO2 28  GLUCOSE 111*  BUN 11  CREATININE 0.93  CALCIUM 9.9    GFR: CrCl cannot be calculated (Unknown ideal weight.).  Liver Function Tests: Recent Labs  Lab 03/06/21 1321  AST 17  ALT 15  ALKPHOS 44  BILITOT 1.0  PROT 8.5*  ALBUMIN 5.1*    Urine analysis:    Component Value Date/Time   COLORURINE AMBER (A) 07/10/2017 2043   APPEARANCEUR HAZY (A) 07/10/2017 2043   LABSPEC 1.030 07/10/2017 2043   PHURINE 5.0 07/10/2017 2043   GLUCOSEU 50 (  A) 07/10/2017 2043   HGBUR MODERATE (A) 07/10/2017 2043   BILIRUBINUR SMALL (A) 07/10/2017 2043   KETONESUR 5 (A) 07/10/2017 2043   PROTEINUR 100 (A) 07/10/2017 2043   NITRITE NEGATIVE 07/10/2017 2043   LEUKOCYTESUR NEGATIVE 07/10/2017 2043    Radiological Exams on Admission: CT HEAD WO CONTRAST (5MM)  Result Date: 03/06/2021 CLINICAL DATA:  Headache, chronic, new features or increased frequency EXAM: CT HEAD WITHOUT CONTRAST TECHNIQUE: Contiguous axial images were obtained from the base of the skull through the vertex without intravenous contrast. COMPARISON:  None. FINDINGS: Brain: Probable mass in the left cerebellum, difficult to characterize on this noncontrast head CT but estimated to measure up to 4.9 x 4.2 cm on series 2, image 11. Extensive surrounding edema with marked mass effect on the surrounding cerebellum and brainstem. Approximately 9 mm of  inferior cerebellar tonsillar herniation. The fourth ventricle and basal cisterns are effaced. Resulting obstructive hydrocephalus with enlargement of the lateral and fourth ventricles and diffuse sulcal effacement. No evidence of acute large vascular territory infarct, acute hemorrhage, or extra-axial fluid collection. Vascular: No hyperdense vessel identified. Skull: No acute fracture. Sinuses/Orbits: Complete opacification of the right frontal sinus, anterior right ethmoid air cells, and imaged right maxillary sinus. Other: No mastoid effusions. IMPRESSION: 1. Probable mass in the left cerebellum with surrounding edema and severe mass effect on the adjacent cerebellum and brainstem. Effacement of the basal cisterns and 9 mm of inferior cerebellar tonsillar herniation. Recommend neurosurgical consultation and MRI with contrast. 2. Resulting obstructive hydrocephalus with diffuse sulcal effacement. Findings and recommendations discussed with Dr. Pearline Cables via telephone at 2:01 p.m. Electronically Signed   By: Margaretha Sheffield M.D.   On: 03/06/2021 14:09   CT Chest W Contrast  Result Date: 03/06/2021 CLINICAL DATA:  Cancer of unknown primary.  Brain cancer. EXAM: CT CHEST, ABDOMEN, AND PELVIS WITH CONTRAST TECHNIQUE: Multidetector CT imaging of the chest, abdomen and pelvis was performed following the standard protocol during bolus administration of intravenous contrast. CONTRAST:  52mL OMNIPAQUE IOHEXOL 350 MG/ML SOLN COMPARISON:  None. FINDINGS: CT CHEST FINDINGS Cardiovascular: No acute vascular findings. Normal caliber thoracic aorta. No central pulmonary embolus to the lobar level on this non dedicated CTA exam. The heart is normal in size. No pericardial effusion or thickening. Mediastinum/Nodes: No enlarged mediastinal, hilar, or axillary lymph nodes. No esophageal wall thickening. No thyroid nodule. Lungs/Pleura: Clear lungs. No pulmonary nodule or mass. No focal airspace disease. No pleural fluid or  thickening. Trachea and central bronchi are patent. Musculoskeletal: No lytic or blastic osseous lesions. No acute osseous findings. No obvious intraspinal abnormality on CT. Unremarkable chest wall soft tissues. CT ABDOMEN PELVIS FINDINGS Hepatobiliary: No focal hepatic lesion or abnormality. Normal liver size and appearance. Normal gallbladder. No biliary dilatation. Pancreas: Unremarkable. No pancreatic ductal dilatation or surrounding inflammatory changes. No pancreatic mass. Spleen: Splenic cleft.  Normal in size.  No focal lesion. Adrenals/Urinary Tract: No adrenal nodule. No focal renal abnormality or mass. Homogeneous renal enhancement. No hydronephrosis. Early excretion of IV contrast in the renal collecting systems, no obvious renal stone. Unremarkable urinary bladder. Layering high-density partially obscures assessment. Stomach/Bowel: Bowel assessment is limited in the absence of enteric contrast. Unremarkable appearance of the stomach. No small bowel mass, obstruction, or inflammation. Normal appendix. Small volume of colonic stool. No visualized colonic mass. Vascular/Lymphatic: Normal caliber abdominal aorta. Patent portal vein. No abdominal, retroperitoneal, mesenteric, or pelvic adenopathy. No enlarged inguinal nodes. Reproductive: Unremarkable prostate gland. Majority of the scrotum is not included in  the field of view. There may be a calcification in the upper right hemiscrotum, partially included. Other: No ascites or free fluid. No free air. No omental thickening. Unremarkable body wall soft tissues. Musculoskeletal: No lytic or blastic osseous lesion. Tiny bone islands in the left femoral head. 1 No obvious intraspinal abnormality on CT. IMPRESSION: 1. No evidence of primary malignancy or metastatic disease in the chest, abdomen, or pelvis. 2. Majority of the scrotum is not included in the field of view. There may be a calcification in the upper right hemiscrotum, partially included. Consider  scrotal ultrasound for testicular assessment. Electronically Signed   By: Keith Rake M.D.   On: 03/06/2021 19:19   MR BRAIN W WO CONTRAST  Result Date: 03/06/2021 CLINICAL DATA:  Headache with abnormal CT EXAM: MRI HEAD WITHOUT AND WITH CONTRAST TECHNIQUE: Multiplanar, multiecho pulse sequences of the brain and surrounding structures were obtained without and with intravenous contrast. CONTRAST:  5mL GADAVIST GADOBUTROL 1 MMOL/ML IV SOLN COMPARISON:  CT same day FINDINGS: Brain: Arising in the left cerebellar hemisphere, there is a cystic and solid mass lesion measuring 5.4 x 3.6 x 3.5 cm with mass-effect upon the fourth ventricle and obstructive hydrocephalus of the lateral and third ventricles. The solid component measures 4 x 3.5 x 3 cm and occupies the majority of the lesion. The solid component shows relative restricted diffusion. There is low level enhancement in portions of the solid component, but there is no intense enhancement. There is relatively little vasogenic edema within the left cerebellum. There is mass-effect upon the brainstem and there is cerebellar tonsillar extension through the foramen magnum of 11 mm. Cerebral hemispheres are intrinsically normal. There is some subependymal resorption of CSF. Vascular: Major vessels at the base of the brain show flow. Skull and upper cervical spine: Negative Sinuses/Orbits: Inflammatory changes of the paranasal sinuses with complete opacification of the right maxillary, ethmoid and frontal sinuses. Other: None IMPRESSION: 5.4 x 3.6 x 3.5 cm mass lesion within the left cerebellar hemisphere, primarily solid but with cystic components, with mass-effect upon the fourth ventricle resulting in obstructive hydrocephalus of the lateral and third ventricles with subependymal resorption of CSF. Notably, the solid portions of the lesion show only low level enhancement. The lesion definitely enhances less than typical medulloblastoma or astrocytoma. I think  the differential diagnosis would include a somewhat atypical medulloblastoma or astrocytoma, ependymoma, and atypical dysplastic cerebellar gangliocytoma (Lhermitte Duclos). Electronically Signed   By: Nelson Chimes M.D.   On: 03/06/2021 15:47   CT ABDOMEN PELVIS W CONTRAST  Result Date: 03/06/2021 CLINICAL DATA:  Cancer of unknown primary.  Brain cancer. EXAM: CT CHEST, ABDOMEN, AND PELVIS WITH CONTRAST TECHNIQUE: Multidetector CT imaging of the chest, abdomen and pelvis was performed following the standard protocol during bolus administration of intravenous contrast. CONTRAST:  6mL OMNIPAQUE IOHEXOL 350 MG/ML SOLN COMPARISON:  None. FINDINGS: CT CHEST FINDINGS Cardiovascular: No acute vascular findings. Normal caliber thoracic aorta. No central pulmonary embolus to the lobar level on this non dedicated CTA exam. The heart is normal in size. No pericardial effusion or thickening. Mediastinum/Nodes: No enlarged mediastinal, hilar, or axillary lymph nodes. No esophageal wall thickening. No thyroid nodule. Lungs/Pleura: Clear lungs. No pulmonary nodule or mass. No focal airspace disease. No pleural fluid or thickening. Trachea and central bronchi are patent. Musculoskeletal: No lytic or blastic osseous lesions. No acute osseous findings. No obvious intraspinal abnormality on CT. Unremarkable chest wall soft tissues. CT ABDOMEN PELVIS FINDINGS Hepatobiliary: No focal hepatic lesion  or abnormality. Normal liver size and appearance. Normal gallbladder. No biliary dilatation. Pancreas: Unremarkable. No pancreatic ductal dilatation or surrounding inflammatory changes. No pancreatic mass. Spleen: Splenic cleft.  Normal in size.  No focal lesion. Adrenals/Urinary Tract: No adrenal nodule. No focal renal abnormality or mass. Homogeneous renal enhancement. No hydronephrosis. Early excretion of IV contrast in the renal collecting systems, no obvious renal stone. Unremarkable urinary bladder. Layering high-density partially  obscures assessment. Stomach/Bowel: Bowel assessment is limited in the absence of enteric contrast. Unremarkable appearance of the stomach. No small bowel mass, obstruction, or inflammation. Normal appendix. Small volume of colonic stool. No visualized colonic mass. Vascular/Lymphatic: Normal caliber abdominal aorta. Patent portal vein. No abdominal, retroperitoneal, mesenteric, or pelvic adenopathy. No enlarged inguinal nodes. Reproductive: Unremarkable prostate gland. Majority of the scrotum is not included in the field of view. There may be a calcification in the upper right hemiscrotum, partially included. Other: No ascites or free fluid. No free air. No omental thickening. Unremarkable body wall soft tissues. Musculoskeletal: No lytic or blastic osseous lesion. Tiny bone islands in the left femoral head. 1 No obvious intraspinal abnormality on CT. IMPRESSION: 1. No evidence of primary malignancy or metastatic disease in the chest, abdomen, or pelvis. 2. Majority of the scrotum is not included in the field of view. There may be a calcification in the upper right hemiscrotum, partially included. Consider scrotal ultrasound for testicular assessment. Electronically Signed   By: Keith Rake M.D.   On: 03/06/2021 19:19     Assessment/Plan Principal Problem:   Cerebellar tumor Mr. Keena is admitted to [place] on Med-Surg floor.  Dr. Venetia Constable for neurosurgery consulted by ER physician and wants to evaluate pt before steroids provided.  CT chest, abdomen/pelvis obtained to evaluate for any other tumors to confirm this is not a metastatic lesion.  CT shows no tumors or masses. 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  Neurosurgery been consulted and evaluate when arrives to [facility]    Headache Tylenol or ibuprofen as needed for mild to moderate pain.  Dilaudid provided for severe pain.    Incoordination Consult physical therapy for evaluation in the morning    Nausea and vomiting Zofran as needed.    DVT prophylaxis: Padua score low. Early ambulation for DVT prophylaxis. Code Status:   Full Code  Family Communication:  Diagnosis and plan discussed with patient in detail.  He verbalized understanding agrees with plan.  Further recommendations to follow as clinically indicated.  Questions were answered. Disposition Plan:   Patient is from:  Home  Anticipated DC to:  Home  Anticipated DC date:  Anticipate greater than 2 midnight stay in the hospital for evaluation work-up of medical condition.  Anticipated DC barriers: No barriers to discharge identified at this time  Consults called:  Neurosurgery, Dr. Venetia Constable who will see patient after arrival to [facility] for admission Admission status:  Inpatient   Yevonne Aline Tazaria Dlugosz MD [facility]  How to contact the [facility] Attending or Consulting provider [place] or covering provider during after hours [place], for this patient?   Check the care team in [facility] and look for a) attending/consulting [facility] provider listed and b) the [facility] team listed Log into [email or web address] and use [place]'s universal  password to access. If you do not have the password, please contact the hospital operator. Locate the [facility] provider you are looking for under [facility] and page to a number that you can be directly reached. If you still have difficulty reaching the provider, please page the [facility] (Director on Call) for the Hospitalists listed on amion for assistance.  03/06/2021, 8:09 PM

## 2021-03-06 NOTE — ED Provider Notes (Signed)
[facility] DEPT Provider Note   CSN: [id number] Arrival date & time: 03/06/21  1231     History Chief Complaint  Patient presents with   Headache    Scott Snyder is a 25 y.o. male.  Patient is a 25 yo male presenting from home for headache, nausea, vomiting, and new difficulty ambulating. Pt states headache has been progressive since the first week of August. States he has been to urgent care three times and pcp once in the past two months. No imaging at this point. Denies fevers, chills, coughing, sore throat, nasal congestion, abdominal pain or diarrhea. Describes difficulty ambulating stating "I kind of feel like I'm walking off to the side". Admits to "feeling like my fingers are moving slowly on my left thumb and index finger. Like it's hard to type or grab things quickly".   The history is provided by the patient. No language interpreter was used.  Headache Associated symptoms: nausea, numbness, vomiting and weakness   Associated symptoms: no abdominal pain, no back pain, no cough, no ear pain, no eye pain, no fever, no seizures and no sore throat       History reviewed. No pertinent past medical history.  Patient Active Problem List   Diagnosis Date Noted   Acute renal failure ([place])    Rhabdomyolysis 07/11/2017   AKI (acute kidney injury) ([place]) 07/11/2017   Dehydration 07/11/2017   Nausea and vomiting 07/11/2017   Hypokalemia 07/11/2017    History reviewed. No pertinent surgical history.     Family History  Family history unknown: Yes    Social History   Tobacco Use   Smoking status: Never   Smokeless tobacco: Never  Vaping Use   Vaping Use: Never used  Substance Use Topics   Alcohol use: Yes    Comment: socially    Home Medications Prior to Admission medications   Medication Sig Start Date End Date Taking? Authorizing Provider  budesonide (RHINOCORT ALLERGY) 32 MCG/ACT nasal spray Place 1 spray into both nostrils 2  (two) times daily. 02/13/21   Volney American, PA-C  ondansetron (ZOFRAN ODT) 4 MG disintegrating tablet Take 1 tablet (4 mg total) by mouth every 8 (eight) hours as needed for nausea or vomiting. 02/13/21   Volney American, PA-C  SUMAtriptan (IMITREX) 50 MG tablet Take one tab at onset of headache. May repeat in 2 hours if headache persists or recurs. Max of 2 tabs daily 02/22/21   Hazel Sams, PA-C    Allergies    Patient has no known allergies.  Review of Systems   Review of Systems  Constitutional:  Negative for chills and fever.  HENT:  Negative for ear pain and sore throat.   Eyes:  Negative for pain and visual disturbance.  Respiratory:  Negative for cough and shortness of breath.   Cardiovascular:  Negative for chest pain and palpitations.  Gastrointestinal:  Positive for nausea and vomiting. Negative for abdominal pain.  Genitourinary:  Negative for dysuria and hematuria.  Musculoskeletal:  Negative for arthralgias and back pain.  Skin:  Negative for color change and rash.  Neurological:  Positive for weakness, numbness and headaches. Negative for seizures and syncope.  All other systems reviewed and are negative.  Physical Exam Updated Vital Signs BP (!) 129/92   Pulse 87   Temp 97.9 F (36.6 C) (Oral)   Resp 15   SpO2 100%   Physical Exam Vitals and nursing note reviewed.  Constitutional:  Appearance: He is well-developed.  HENT:     Head: Normocephalic and atraumatic.  Eyes:     Conjunctiva/sclera: Conjunctivae normal.  Cardiovascular:     Rate and Rhythm: Normal rate and regular rhythm.     Heart sounds: No murmur heard. Pulmonary:     Effort: Pulmonary effort is normal. No respiratory distress.     Breath sounds: Normal breath sounds.  Abdominal:     Palpations: Abdomen is soft.     Tenderness: There is no abdominal tenderness.  Musculoskeletal:     Cervical back: Neck supple.  Skin:    General: Skin is warm and dry.  Neurological:      Mental Status: He is alert.     GCS: GCS eye subscore is 4. GCS verbal subscore is 5. GCS motor subscore is 6.     Cranial Nerves: Cranial nerves are intact.     Sensory: Sensation is intact.     Motor: Motor function is intact.     Coordination: Coordination is intact.    ED Results / Procedures / Treatments   Labs (all labs ordered are listed, but only abnormal results are displayed) Labs Reviewed  COMPREHENSIVE METABOLIC PANEL - Abnormal; Notable for the following components:      Result Value   Glucose, Bld 111 (*)    Total Protein 8.5 (*)    Albumin 5.1 (*)    All other components within normal limits  CBC WITH DIFFERENTIAL/PLATELET - Abnormal; Notable for the following components:   RDW 11.4 (*)    All other components within normal limits    EKG None  Radiology CT HEAD WO CONTRAST (5MM)  Result Date: 03/06/2021 CLINICAL DATA:  Headache, chronic, new features or increased frequency EXAM: CT HEAD WITHOUT CONTRAST TECHNIQUE: Contiguous axial images were obtained from the base of the skull through the vertex without intravenous contrast. COMPARISON:  None. FINDINGS: Brain: Probable mass in the left cerebellum, difficult to characterize on this noncontrast head CT but estimated to measure up to 4.9 x 4.2 cm on series 2, image 11. Extensive surrounding edema with marked mass effect on the surrounding cerebellum and brainstem. Approximately 9 mm of inferior cerebellar tonsillar herniation. The fourth ventricle and basal cisterns are effaced. Resulting obstructive hydrocephalus with enlargement of the lateral and fourth ventricles and diffuse sulcal effacement. No evidence of acute large vascular territory infarct, acute hemorrhage, or extra-axial fluid collection. Vascular: No hyperdense vessel identified. Skull: No acute fracture. Sinuses/Orbits: Complete opacification of the right frontal sinus, anterior right ethmoid air cells, and imaged right maxillary sinus. Other: No mastoid  effusions. IMPRESSION: 1. Probable mass in the left cerebellum with surrounding edema and severe mass effect on the adjacent cerebellum and brainstem. Effacement of the basal cisterns and 9 mm of inferior cerebellar tonsillar herniation. Recommend neurosurgical consultation and MRI with contrast. 2. Resulting obstructive hydrocephalus with diffuse sulcal effacement. Findings and recommendations discussed with Dr. Pearline Cables via telephone at 2:01 p.m. Electronically Signed   By: Margaretha Sheffield M.D.   On: 03/06/2021 14:09   MR BRAIN W WO CONTRAST  Result Date: 03/06/2021 CLINICAL DATA:  Headache with abnormal CT EXAM: MRI HEAD WITHOUT AND WITH CONTRAST TECHNIQUE: Multiplanar, multiecho pulse sequences of the brain and surrounding structures were obtained without and with intravenous contrast. CONTRAST:  78mL GADAVIST GADOBUTROL 1 MMOL/ML IV SOLN COMPARISON:  CT same day FINDINGS: Brain: Arising in the left cerebellar hemisphere, there is a cystic and solid mass lesion measuring 5.4 x 3.6 x 3.5 cm  with mass-effect upon the fourth ventricle and obstructive hydrocephalus of the lateral and third ventricles. The solid component measures 4 x 3.5 x 3 cm and occupies the majority of the lesion. The solid component shows relative restricted diffusion. There is low level enhancement in portions of the solid component, but there is no intense enhancement. There is relatively little vasogenic edema within the left cerebellum. There is mass-effect upon the brainstem and there is cerebellar tonsillar extension through the foramen magnum of 11 mm. Cerebral hemispheres are intrinsically normal. There is some subependymal resorption of CSF. Vascular: Major vessels at the base of the brain show flow. Skull and upper cervical spine: Negative Sinuses/Orbits: Inflammatory changes of the paranasal sinuses with complete opacification of the right maxillary, ethmoid and frontal sinuses. Other: None IMPRESSION: 5.4 x 3.6 x 3.5 cm mass lesion  within the left cerebellar hemisphere, primarily solid but with cystic components, with mass-effect upon the fourth ventricle resulting in obstructive hydrocephalus of the lateral and third ventricles with subependymal resorption of CSF. Notably, the solid portions of the lesion show only low level enhancement. The lesion definitely enhances less than typical medulloblastoma or astrocytoma. I think the differential diagnosis would include a somewhat atypical medulloblastoma or astrocytoma, ependymoma, and atypical dysplastic cerebellar gangliocytoma ([place]). Electronically Signed   By: Nelson Chimes M.D.   On: 03/06/2021 15:47    Procedures Procedures   Medications Ordered in ED Medications  gadobutrol (GADAVIST) 1 MMOL/ML injection 9 mL (9 mLs Intravenous Contrast Given 03/06/21 1516)    ED Course  I have reviewed the triage vital signs and the nursing notes.  Pertinent labs & imaging results that were available during my care of the patient were reviewed by me and considered in my medical decision making (see chart for details).    MDM Rules/Calculators/A&P                         4:44 PM 25 yo male presenting from home for headache, nausea, vomiting, and new difficulty ambulating. Patient is Aox3, no acute distress, afebrile, well appearing, with otherwise stable vitals. Physical exam demonstrates no neurovascular deficits.   CT head demonstrates: 1. Probable mass in the left cerebellum with surrounding edema and severe mass effect on the adjacent cerebellum and brainstem. Effacement of the basal cisterns and 9 mm of inferior cerebellar tonsillar herniation. Recommend neurosurgical consultation and MRI with contrast. 2. Resulting obstructive hydrocephalus with diffuse sulcal effacement.  MRI demonstrates: 5.4 x 3.6 x 3.5 cm mass lesion within the left cerebellar hemisphere, primarily solid but with cystic components, with mass-effect upon the fourth ventricle resulting in  obstructive hydrocephalus of the lateral and third ventricles with subependymal resorption of CSF. Notably, the solid portions of the lesion show only low level enhancement. The lesion definitely enhances less than typical medulloblastoma or astrocytoma. I think the differential diagnosis would include a somewhat atypical medulloblastoma or astrocytoma, ependymoma, and atypical dysplastic cerebellar gangliocytoma ([place]).   Pt in no acute distress. No neurological dysfunction on exam. No signs of respiratory distress. I spoke with neurosurgeon Dr. Zada Finders who recommends follow up first thing in office tomorrow morning as well as CT scan chest/abdomen/pelvis to look for primary tumor/malignancy. Images ordered. I spoke with patient's family who do not feel safe taking patient home at this time due to mass effect. Patient signed out to oncoming provider while awaiting call back from neurosurgery for requested transfer and evaluation.  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

## 2021-03-06 NOTE — ED Provider Notes (Signed)
Care assumed from Dr. Pearline Cables.  At time of transfer care, patient is awaiting discussion with neurosurgery about admission plans.  Spoke to Dr. Venetia Constable who agreed with admission at [facility].  Medicine team was called and they will admit patient over to [facility].  Neurosurgery said hold off on steroids until they evaluate the patient themselves.  Thus, as the patient still waiting for bed, he will be transferred ED to ED to [facility] so that neurosurgery can be called to see the patient prior to initiation of steroids.  Medicine is concerned about waiting as long as it may take to get a bed until steroids are started.  Dr. Dene Gentry accepts the patient for ED to ED transfer while awaiting medicine admission.   Clinical Impression: 1. Cerebellar mass   2. Hydrocephalus, unspecified type ([place])   3. Neoplasm causing mass effect on adjacent structures-cerebellum and brainstem   4. Nonintractable headache, unspecified chronicity pattern, unspecified headache type   5. Nausea   6. Gait abnormality     Disposition: Admit  This note was prepared with assistance of Dragon voice recognition software. Occasional wrong-word or sound-a-like substitutions may have occurred due to the inherent limitations of voice recognition software.     Sabrina Arriaga, Gwenyth Allegra, MD 03/06/21 2232

## 2021-03-07 ENCOUNTER — Inpatient Hospital Stay (HOSPITAL_COMMUNITY): Payer: BLUE CROSS/BLUE SHIELD

## 2021-03-07 DIAGNOSIS — R279 Unspecified lack of coordination: Secondary | ICD-10-CM

## 2021-03-07 DIAGNOSIS — R112 Nausea with vomiting, unspecified: Secondary | ICD-10-CM

## 2021-03-07 DIAGNOSIS — R519 Headache, unspecified: Secondary | ICD-10-CM

## 2021-03-07 DIAGNOSIS — R739 Hyperglycemia, unspecified: Secondary | ICD-10-CM

## 2021-03-07 DIAGNOSIS — D496 Neoplasm of unspecified behavior of brain: Secondary | ICD-10-CM | POA: Diagnosis not present

## 2021-03-07 DIAGNOSIS — G911 Obstructive hydrocephalus: Secondary | ICD-10-CM

## 2021-03-07 DIAGNOSIS — E871 Hypo-osmolality and hyponatremia: Secondary | ICD-10-CM

## 2021-03-07 LAB — CBC
HCT: 45.3 % (ref 39.0–52.0)
Hemoglobin: 15.7 g/dL (ref 13.0–17.0)
MCH: 31 pg (ref 26.0–34.0)
MCHC: 34.7 g/dL (ref 30.0–36.0)
MCV: 89.3 fL (ref 80.0–100.0)
Platelets: 269 10*3/uL (ref 150–400)
RBC: 5.07 MIL/uL (ref 4.22–5.81)
RDW: 11.3 % — ABNORMAL LOW (ref 11.5–15.5)
WBC: 8.4 10*3/uL (ref 4.0–10.5)
nRBC: 0 % (ref 0.0–0.2)

## 2021-03-07 LAB — COMPREHENSIVE METABOLIC PANEL
ALT: 14 U/L (ref 0–44)
AST: 14 U/L — ABNORMAL LOW (ref 15–41)
Albumin: 4.4 g/dL (ref 3.5–5.0)
Alkaline Phosphatase: 40 U/L (ref 38–126)
Anion gap: 10 (ref 5–15)
BUN: 11 mg/dL (ref 6–20)
CO2: 23 mmol/L (ref 22–32)
Calcium: 9.8 mg/dL (ref 8.9–10.3)
Chloride: 101 mmol/L (ref 98–111)
Creatinine, Ser: 0.92 mg/dL (ref 0.61–1.24)
GFR, Estimated: >60 mL/min (ref >60)
Glucose, Bld: 120 mg/dL — ABNORMAL HIGH (ref 70–99)
Potassium: 4.2 mmol/L (ref 3.5–5.1)
Sodium: 134 mmol/L — ABNORMAL LOW (ref 135–145)
Total Bilirubin: 1 mg/dL (ref 0.3–1.2)
Total Protein: 7.3 g/dL (ref 6.5–8.1)

## 2021-03-07 LAB — HIV ANTIBODY (ROUTINE TESTING W REFLEX): HIV Screen 4th Generation wRfx: NONREACTIVE

## 2021-03-07 LAB — SARS CORONAVIRUS 2 (TAT 6-24 HRS): SARS Coronavirus 2: NEGATIVE

## 2021-03-07 IMAGING — US US SCROTUM
1 series · 14 of 25 positions shown · non-contrast
Comparison: Abdominopelvic CT earlier today.

CLINICAL DATA: Cerebellar tumor, assess for malignancy.
Questionable right scrotal calcification on CT.

EXAM:
ULTRASOUND OF SCROTUM
TECHNIQUE: Complete ultrasound examination of the testicles, epididymis, and
other scrotal structures was performed.

[Series 1: us scrotum · 14 of 54 slices shown]
[im 1/54]
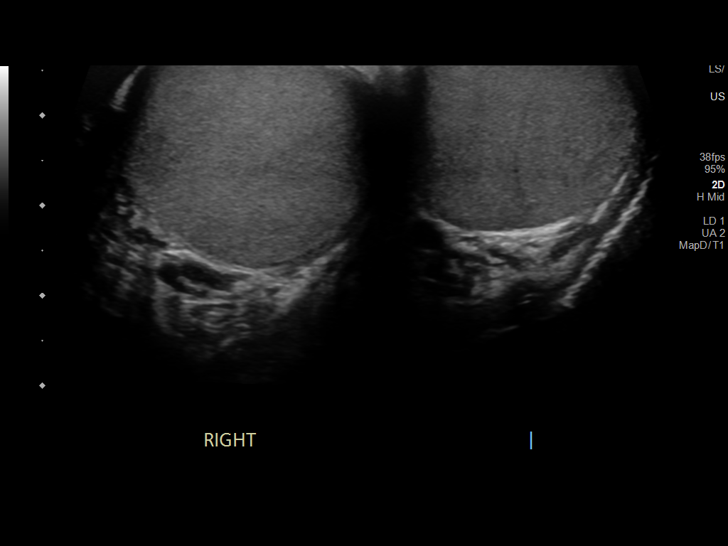
[im 5/54]
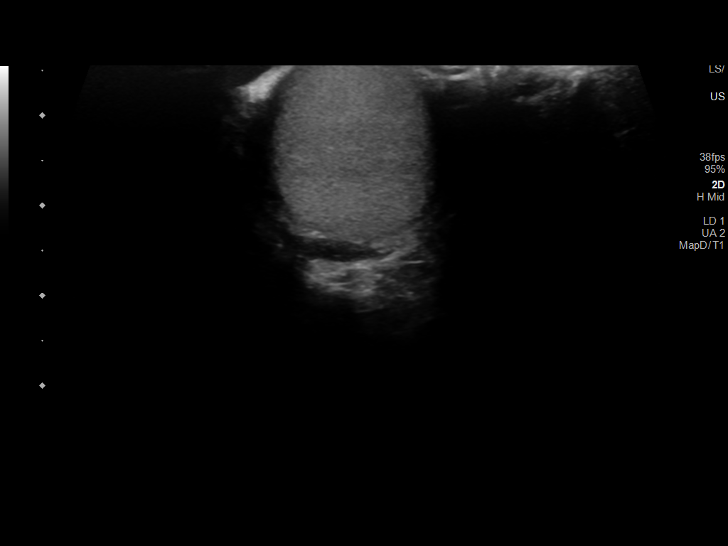
[im 9/54]
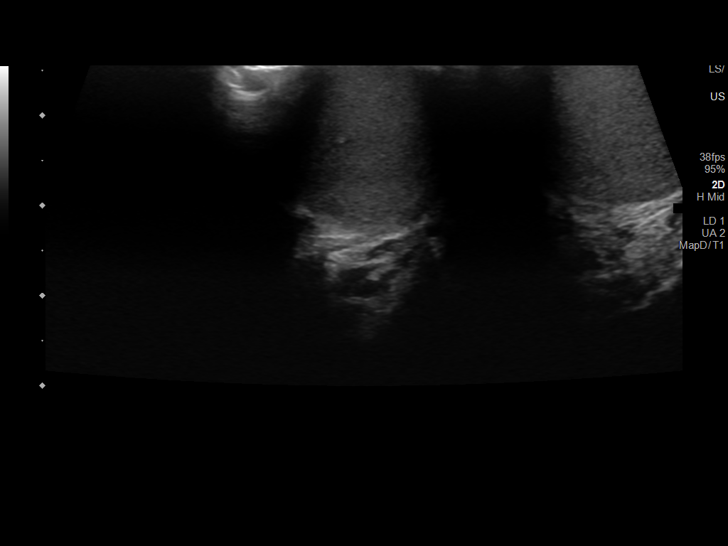
[im 14/54]
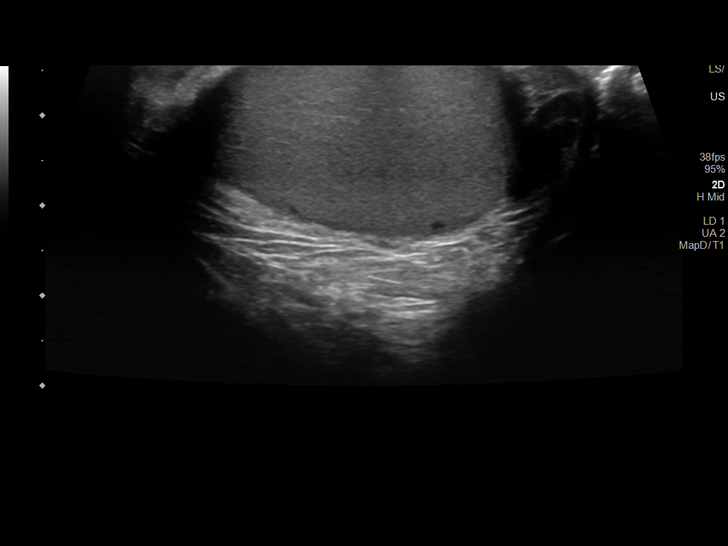
[im 18/54]
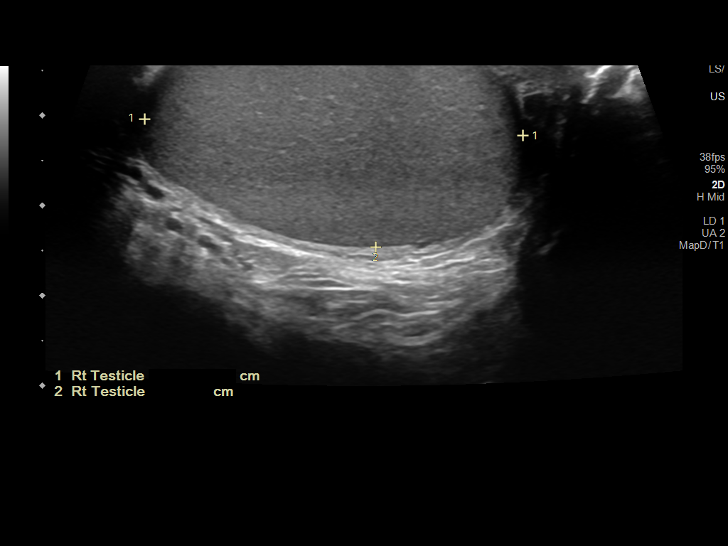
[im 20/54]
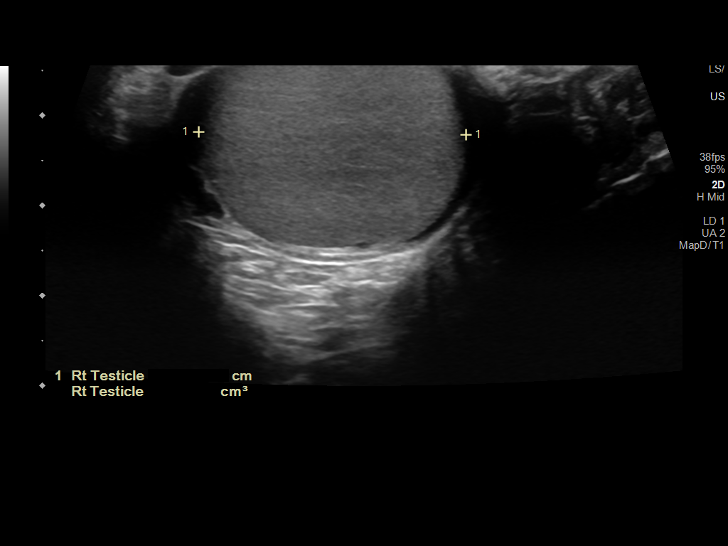
[im 25/54]
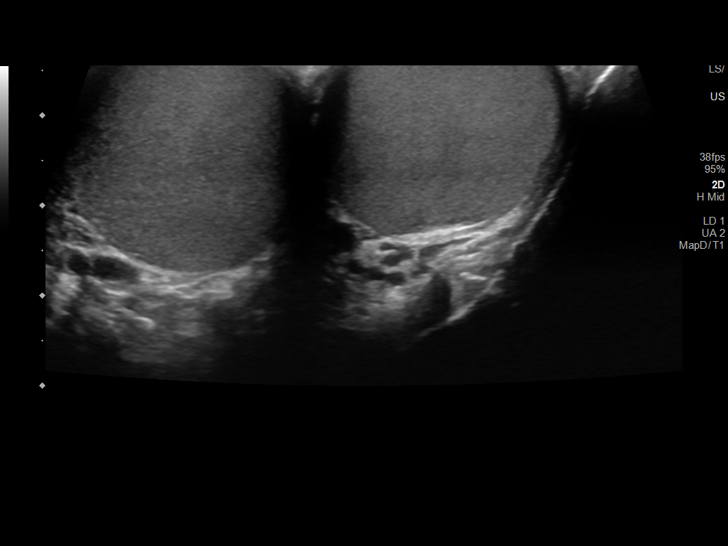
[im 29/54]
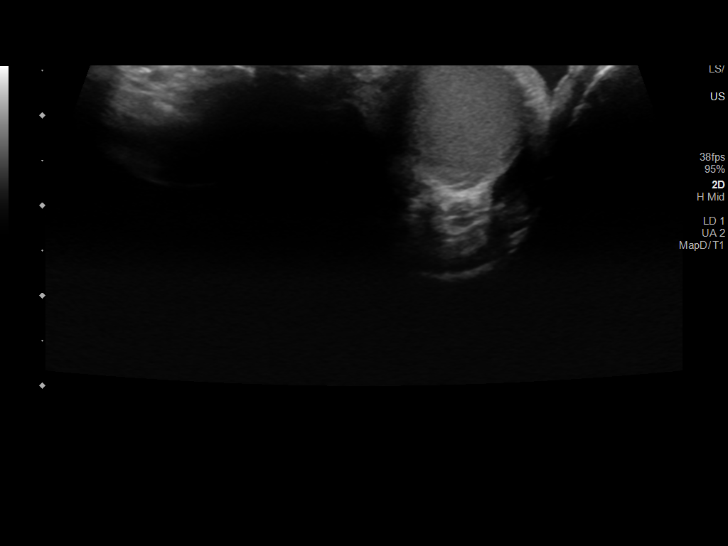
[im 34/54]
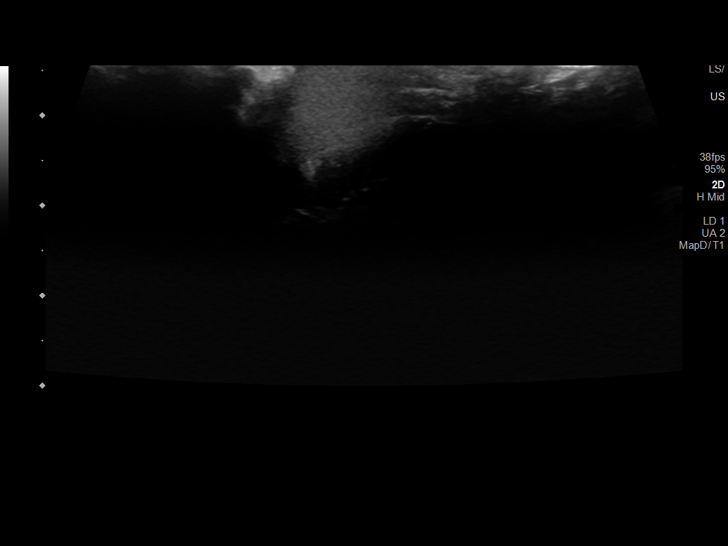
[im 36/54]
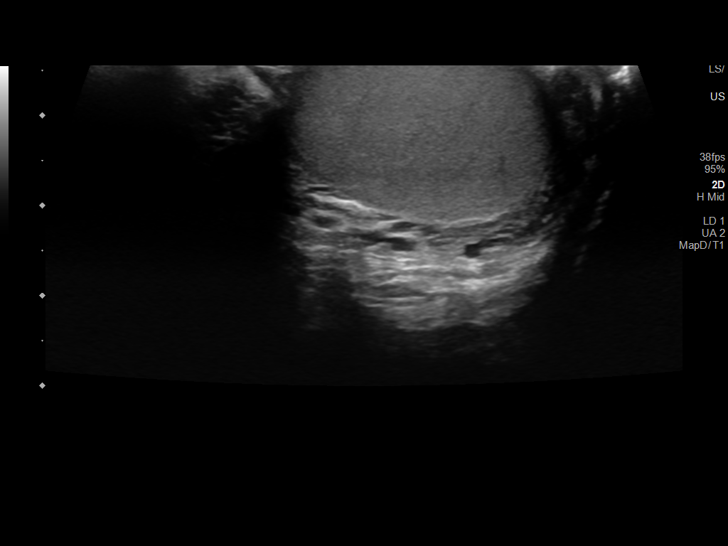
[im 40/54]
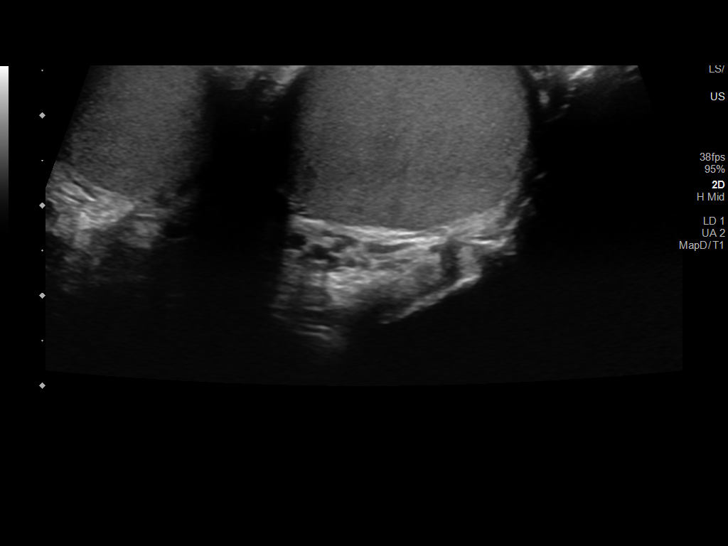
[im 45/54]
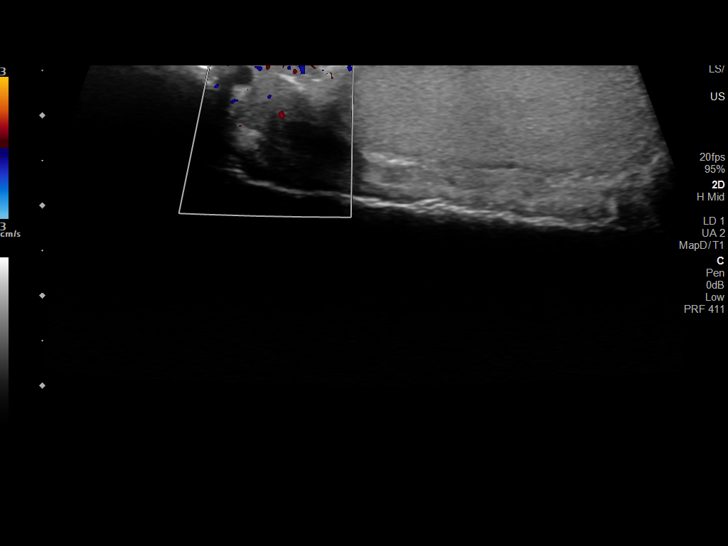
[im 49/54]
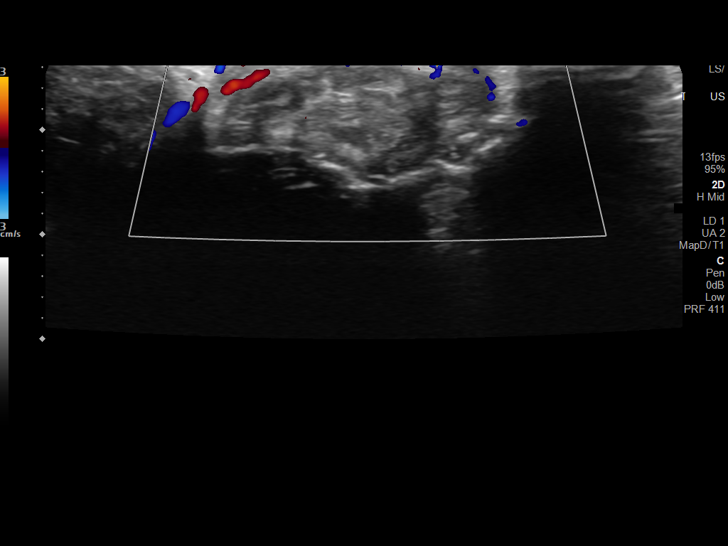
[im 54/54]
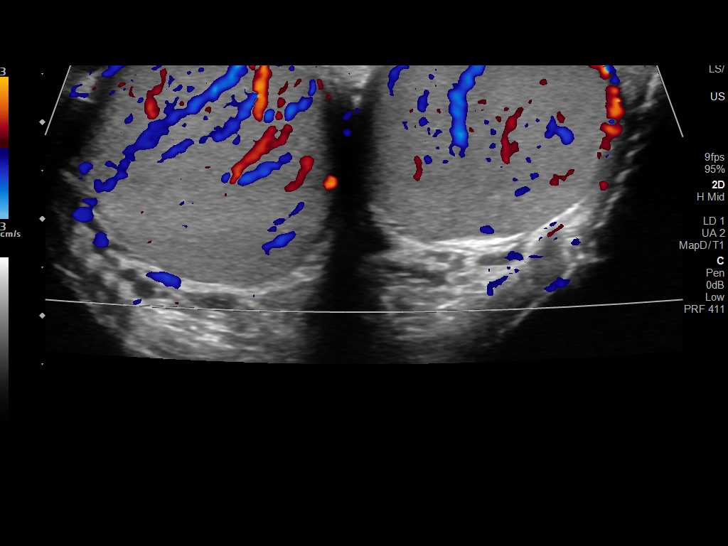

[14 of 25 positions shown; findings below may reference images not displayed]

FINDINGS: Right testicle

Measurements: 4.2 x 2.2 x 3.0 cm. Homogeneous echogenicity. Blood
flow is noted. No mass or microlithiasis visualized. No definite
scrotal calcification is seen.

Left testicle

Measurements: 4.0 x 2.0 x 2.6 cm. Homogeneous echogenicity. Blood
flow is noted. No mass or microlithiasis visualized.

Right epididymis:  Normal in size and appearance.

Left epididymis: Normal in size. Tiny incidental epididymal head
cyst measures 3 mm.

Hydrocele:  Trace bilaterally.

Varicocele:  None visualized.
IMPRESSION: Trace bilateral hydroceles. No evidence for testicular mass or other
significant abnormality.

## 2021-03-07 MED ORDER — DEXAMETHASONE SODIUM PHOSPHATE 4 MG/ML IJ SOLN
4.0000 mg | Freq: Two times a day (BID) | INTRAMUSCULAR | Status: DC
  Administered 2021-03-07 – 2021-03-08 (×3): 4 mg via INTRAVENOUS
  Filled 2021-03-07 (×3): qty 1

## 2021-03-07 MED ORDER — HYDROMORPHONE HCL 1 MG/ML IJ SOLN
0.5000 mg | INTRAMUSCULAR | Status: DC | PRN
  Administered 2021-03-07 – 2021-03-08 (×2): 0.5 mg via INTRAVENOUS
  Filled 2021-03-07 (×2): qty 1

## 2021-03-07 MED ORDER — SODIUM CHLORIDE 0.9 % IV SOLN: INTRAVENOUS | Status: AC

## 2021-03-07 MED ORDER — ONDANSETRON HCL 4 MG/2ML IJ SOLN
4.0000 mg | Freq: Four times a day (QID) | INTRAMUSCULAR | Status: DC | PRN
  Administered 2021-03-07 – 2021-03-10 (×9): 4 mg via INTRAVENOUS
  Filled 2021-03-07 (×9): qty 2

## 2021-03-07 MED ORDER — DEXAMETHASONE SODIUM PHOSPHATE 10 MG/ML IJ SOLN
10.0000 mg | Freq: Once | INTRAMUSCULAR | Status: AC
  Administered 2021-03-07: 10 mg via INTRAVENOUS
  Filled 2021-03-07: qty 1

## 2021-03-07 MED ORDER — ACETAMINOPHEN 650 MG RE SUPP: 650.0000 mg | Freq: Four times a day (QID) | RECTAL | Status: DC | PRN

## 2021-03-07 MED ORDER — HYDROMORPHONE HCL 1 MG/ML IJ SOLN
0.5000 mg | INTRAMUSCULAR | Status: DC | PRN
  Administered 2021-03-07: 1 mg via INTRAVENOUS
  Filled 2021-03-07: qty 1

## 2021-03-07 MED ORDER — ACETAMINOPHEN 325 MG PO TABS
650.0000 mg | ORAL_TABLET | Freq: Four times a day (QID) | ORAL | Status: DC | PRN
  Administered 2021-03-07: 650 mg via ORAL
  Filled 2021-03-07: qty 2

## 2021-03-07 MED ORDER — LACTATED RINGERS IV SOLN: INTRAVENOUS | Status: DC

## 2021-03-07 MED ORDER — ONDANSETRON HCL 4 MG PO TABS: 4.0000 mg | ORAL_TABLET | Freq: Four times a day (QID) | ORAL | Status: DC | PRN

## 2021-03-07 MED ORDER — SENNOSIDES-DOCUSATE SODIUM 8.6-50 MG PO TABS
1.0000 | ORAL_TABLET | Freq: Every evening | ORAL | Status: DC | PRN
  Administered 2021-03-11 – 2021-03-15 (×4): 1 via ORAL
  Filled 2021-03-07 (×4): qty 1

## 2021-03-07 MED ORDER — IBUPROFEN 400 MG PO TABS: 600.0000 mg | ORAL_TABLET | Freq: Four times a day (QID) | ORAL | Status: DC | PRN

## 2021-03-07 NOTE — Progress Notes (Signed)
PROGRESS NOTE  Scott Snyder XFG:[id number] DOB: October 13, 1995   PCP: Patient, No Pcp Per (Inactive)  Patient is from: Home.  DOA: 03/06/2021 LOS: 1  Chief complaints:  Chief Complaint  Patient presents with   Headache     Brief Narrative / Interim history: 25 year old M with no PMH returned to ED with 2 months of progressive throbbing headache with associated nausea, vomiting and ataxia and found to have cerebellar mass measuring about 5 cm with mass-effect, and admitted.  Neurosurgery consulted and following.  Subjective: Seen and examined earlier this morning.  Headache has resolved after IV Decadron.  Currently reports nausea but no emesis.  He denies focal numbness, tingling or weakness.  He denies vision change, chest pain, dyspnea, GI or UTI symptoms.  Objective: Vitals:   03/07/21 0023 03/07/21 0215 03/07/21 0400 03/07/21 1000  BP: (!) 149/89 (!) 143/84 132/79 138/87  Pulse: 98 96 71 69  Resp: 14 17 16 13   Temp: 97.6 F (36.4 C)     TempSrc: Oral     SpO2: 98% 97% 96% 97%   No intake or output data in the 24 hours ending 03/07/21 1350 There were no vitals filed for this visit.  Examination:  GENERAL: No apparent distress.  Nontoxic. HEENT: MMM.  Vision and hearing grossly intact.  NECK: Supple.  No apparent JVD.  RESP: On RA.  No IWOB.  Fair aeration bilaterally. CVS:  RRR. Heart sounds normal.  ABD/GI/GU: BS+. Abd soft, NTND.  MSK/EXT:  Moves extremities. No apparent deformity. No edema.  SKIN: no apparent skin lesion or wound NEURO: Awake, alert and oriented appropriately. Speech clear. Cranial nerves II-XII  intact. Motor 5/5 in all muscle groups of UE and LE bilaterally, Normal tone. Light sensation intact in all dermatomes of upper and lower ext bilaterally. Patellar reflex symmetric.  No pronator drift.  Finger to nose intact. PSYCH: Calm. Normal affect.   Procedures:  None  Microbiology summarized: [id number] and influenza PCR nonreactive.  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  Nausea and vomiting: Likely due to the above. -Decadron as above -Antiemetics -Gentle IV fluid until nausea and emesis subsides.  Mild hyponatremia: NA 134. -Change LR to NS  Mild hyperglycemia: Likely due to steroid. -Continue monitoring   There is no height or weight on file to calculate BMI.         DVT prophylaxis:    SCD  Code Status: Full code Family Communication: Updated patient's mother at bedside. Level of care: Med-Surg Status is: Inpatient  Remains inpatient appropriate because:Ongoing diagnostic testing needed not appropriate for outpatient work up, IV treatments appropriate due to intensity of illness or inability to take PO, and Inpatient level of care appropriate due to severity of illness  Dispo: The patient is from: Home              Anticipated d/c is to: Home              Patient currently is not medically stable to d/c.   Difficult to place patient No       Consultants:  Neurosurgery   Sch Meds:  Scheduled Meds:  dexamethasone (DECADRON) injection  4 mg Intravenous Q12H   Continuous Infusions:  lactated ringers 100 mL/hr at 03/07/21 1124   PRN Meds:.acetaminophen **OR** acetaminophen, HYDROmorphone (DILAUDID)  injection, ondansetron **OR** ondansetron (ZOFRAN) IV, senna-docusate  Antimicrobials: Anti-infectives (From admission, onward)    None        I have personally reviewed the following labs and images: CBC: Recent Labs  Lab 03/06/21 1321 03/07/21 0420  WBC 9.8 8.4  NEUTROABS 7.7  --   HGB 16.1 15.7  HCT 46.8 45.3  MCV 88.5 89.3  PLT 308 269   BMP &GFR Recent Labs  Lab 03/06/21 1321 03/07/21 0420  NA 137 134*  K 3.9 4.2  CL 101 101  CO2  28 23  GLUCOSE 111* 120*  BUN 11 11  CREATININE 0.93 0.92  CALCIUM 9.9 9.8   CrCl cannot be calculated (Unknown ideal weight.). Liver & Pancreas: Recent Labs  Lab 03/06/21 1321 03/07/21 0420  AST 17 14*  ALT 15 14  ALKPHOS 44 40  BILITOT 1.0 1.0  PROT 8.5* 7.3  ALBUMIN 5.1* 4.4   No results for input(s): LIPASE, AMYLASE in the last 168 hours. No results for input(s): AMMONIA in the last 168 hours. Diabetic: No results for input(s): HGBA1C in the last 72 hours. No results for input(s): GLUCAP in the last 168 hours. Cardiac Enzymes: No results for input(s): CKTOTAL, CKMB, CKMBINDEX, TROPONINI in the last 168 hours. No results for input(s): PROBNP in the last 8760 hours. Coagulation Profile: No results for input(s): INR, PROTIME in the last 168 hours. Thyroid Function Tests: No results for input(s): TSH, T4TOTAL, FREET4, T3FREE, THYROIDAB in the last 72 hours. Lipid Profile: No results for input(s): CHOL, HDL, LDLCALC, TRIG, CHOLHDL, LDLDIRECT in the last 72 hours. Anemia Panel: No results for input(s): VITAMINB12, FOLATE, FERRITIN, TIBC, IRON, RETICCTPCT in the last 72 hours. Urine analysis:    Component Value Date/Time   COLORURINE AMBER (A) 07/10/2017 2043   APPEARANCEUR HAZY (A) 07/10/2017 2043   LABSPEC 1.030 07/10/2017 2043   PHURINE 5.0 07/10/2017 2043   GLUCOSEU 50 (A) 07/10/2017 2043   HGBUR MODERATE (A) 07/10/2017 2043   BILIRUBINUR SMALL (A) 07/10/2017 2043   KETONESUR 5 (A) 07/10/2017 2043   PROTEINUR 100 (A) 07/10/2017 2043   NITRITE NEGATIVE 07/10/2017 2043   LEUKOCYTESUR NEGATIVE 07/10/2017 2043   Sepsis Labs: Invalid input(s): PROCALCITONIN, [place]  Microbiology: Recent Results (from the past 240 hour(s))  SARS CORONAVIRUS 2 (TAT 6-24 HRS) Nasopharyngeal Nasopharyngeal Swab     Status: None   Collection Time: 03/06/21  9:56 PM   Specimen: Nasopharyngeal Swab  Result Value Ref Range Status   SARS Coronavirus 2 NEGATIVE NEGATIVE Final     Comment: (NOTE) SARS-CoV-2 target nucleic acids are NOT DETECTED.  The SARS-CoV-2 RNA is generally detectable in upper and lower respiratory specimens during the acute phase of infection. Negative results do not preclude SARS-CoV-2 infection, do not rule out co-infections with other pathogens, and should not be used as the sole basis for treatment or other patient management decisions. Negative results must be combined with clinical observations, patient history, and epidemiological information. The expected result is Negative.  Fact Sheet for Patients: SugarRoll.be  Fact Sheet for Healthcare Providers: https://www.woods-mathews.com/  This test is not yet approved or cleared by the [place] FDA and  has been authorized for detection and/or diagnosis of SARS-CoV-2 by FDA under an Emergency Use Authorization (EUA). This EUA will remain  in effect (meaning this test can be used) for the duration of the COVID-19 declaration under Se ction 564(b)(1) of the Act, 21 U.S.C. section 360bbb-3(b)(1), unless the authorization is terminated or revoked sooner.  Performed at  [facility], [place] [address] [place], [place] [address]     Radiology Studies: CT HEAD WO CONTRAST (5MM)  Result Date: 03/06/2021 CLINICAL DATA:  Headache, chronic, new features or increased frequency EXAM: CT HEAD WITHOUT CONTRAST TECHNIQUE: Contiguous axial images were obtained from the base of the skull through the vertex without intravenous contrast. COMPARISON:  None. FINDINGS: Brain: Probable mass in the left cerebellum, difficult to characterize on this noncontrast head CT but estimated to measure up to 4.9 x 4.2 cm on series 2, image 11. Extensive surrounding edema with marked mass effect on the surrounding cerebellum and brainstem. Approximately 9 mm of inferior cerebellar tonsillar herniation. The fourth ventricle and basal cisterns are effaced. Resulting  obstructive hydrocephalus with enlargement of the lateral and fourth ventricles and diffuse sulcal effacement. No evidence of acute large vascular territory infarct, acute hemorrhage, or extra-axial fluid collection. Vascular: No hyperdense vessel identified. Skull: No acute fracture. Sinuses/Orbits: Complete opacification of the right frontal sinus, anterior right ethmoid air cells, and imaged right maxillary sinus. Other: No mastoid effusions. IMPRESSION: 1. Probable mass in the left cerebellum with surrounding edema and severe mass effect on the adjacent cerebellum and brainstem. Effacement of the basal cisterns and 9 mm of inferior cerebellar tonsillar herniation. Recommend neurosurgical consultation and MRI with contrast. 2. Resulting obstructive hydrocephalus with diffuse sulcal effacement. Findings and recommendations discussed with Dr. Pearline Cables via telephone at 2:01 p.m. Electronically Signed   By: Margaretha Sheffield M.D.   On: 03/06/2021 14:09   CT Chest W Contrast  Result Date: 03/06/2021 CLINICAL DATA:  Cancer of unknown primary.  Brain cancer. EXAM: CT CHEST, ABDOMEN, AND PELVIS WITH CONTRAST TECHNIQUE: Multidetector CT imaging of the chest, abdomen and pelvis was performed following the standard protocol during bolus administration of intravenous contrast. CONTRAST:  47mL OMNIPAQUE IOHEXOL 350 MG/ML SOLN COMPARISON:  None. FINDINGS: CT CHEST FINDINGS Cardiovascular: No acute vascular findings. Normal caliber thoracic aorta. No central pulmonary embolus to the lobar level on this non dedicated CTA exam. The heart is normal in size. No pericardial effusion or thickening. Mediastinum/Nodes: No enlarged mediastinal, hilar, or axillary lymph nodes. No esophageal wall thickening. No thyroid nodule. Lungs/Pleura: Clear lungs. No pulmonary nodule or mass. No focal airspace disease. No pleural fluid or thickening. Trachea and central bronchi are patent. Musculoskeletal: No lytic or blastic osseous lesions. No  acute osseous findings. No obvious intraspinal abnormality on CT. Unremarkable chest wall soft tissues. CT ABDOMEN PELVIS FINDINGS Hepatobiliary: No focal hepatic lesion or abnormality. Normal liver size and appearance. Normal gallbladder. No biliary dilatation. Pancreas: Unremarkable. No pancreatic ductal dilatation or surrounding inflammatory changes. No pancreatic mass. Spleen: Splenic cleft.  Normal in size.  No focal lesion. Adrenals/Urinary Tract: No adrenal nodule. No focal renal abnormality or mass. Homogeneous renal enhancement. No hydronephrosis. Early excretion of IV contrast in the renal collecting systems, no obvious renal stone. Unremarkable urinary bladder. Layering high-density partially obscures assessment. Stomach/Bowel: Bowel assessment is limited in the absence of enteric contrast. Unremarkable appearance of the stomach. No small bowel mass, obstruction, or inflammation. Normal appendix. Small volume of colonic stool. No visualized colonic mass. Vascular/Lymphatic: Normal caliber abdominal aorta. Patent portal vein. No abdominal, retroperitoneal, mesenteric, or pelvic adenopathy. No enlarged inguinal nodes. Reproductive: Unremarkable prostate gland. Majority of the scrotum is not included in the field of view. There may be a calcification in the upper right hemiscrotum, partially included. Other: No ascites or free fluid. No free air. No omental thickening. Unremarkable body wall soft tissues.  Musculoskeletal: No lytic or blastic osseous lesion. Tiny bone islands in the left femoral head. 1 No obvious intraspinal abnormality on CT. IMPRESSION: 1. No evidence of primary malignancy or metastatic disease in the chest, abdomen, or pelvis. 2. Majority of the scrotum is not included in the field of view. There may be a calcification in the upper right hemiscrotum, partially included. Consider scrotal ultrasound for testicular assessment. Electronically Signed   By: Keith Rake M.D.   On:  03/06/2021 19:19   MR BRAIN W WO CONTRAST  Result Date: 03/06/2021 CLINICAL DATA:  Headache with abnormal CT EXAM: MRI HEAD WITHOUT AND WITH CONTRAST TECHNIQUE: Multiplanar, multiecho pulse sequences of the brain and surrounding structures were obtained without and with intravenous contrast. CONTRAST:  18mL GADAVIST GADOBUTROL 1 MMOL/ML IV SOLN COMPARISON:  CT same day FINDINGS: Brain: Arising in the left cerebellar hemisphere, there is a cystic and solid mass lesion measuring 5.4 x 3.6 x 3.5 cm with mass-effect upon the fourth ventricle and obstructive hydrocephalus of the lateral and third ventricles. The solid component measures 4 x 3.5 x 3 cm and occupies the majority of the lesion. The solid component shows relative restricted diffusion. There is low level enhancement in portions of the solid component, but there is no intense enhancement. There is relatively little vasogenic edema within the left cerebellum. There is mass-effect upon the brainstem and there is cerebellar tonsillar extension through the foramen magnum of 11 mm. Cerebral hemispheres are intrinsically normal. There is some subependymal resorption of CSF. Vascular: Major vessels at the base of the brain show flow. Skull and upper cervical spine: Negative Sinuses/Orbits: Inflammatory changes of the paranasal sinuses with complete opacification of the right maxillary, ethmoid and frontal sinuses. Other: None IMPRESSION: 5.4 x 3.6 x 3.5 cm mass lesion within the left cerebellar hemisphere, primarily solid but with cystic components, with mass-effect upon the fourth ventricle resulting in obstructive hydrocephalus of the lateral and third ventricles with subependymal resorption of CSF. Notably, the solid portions of the lesion show only low level enhancement. The lesion definitely enhances less than typical medulloblastoma or astrocytoma. I think the differential diagnosis would include a somewhat atypical medulloblastoma or astrocytoma,  ependymoma, and atypical dysplastic cerebellar gangliocytoma (Lhermitte Duclos). Electronically Signed   By: Nelson Chimes M.D.   On: 03/06/2021 15:47   US SCROTUM  Result Date: 03/07/2021 CLINICAL DATA:  Cerebellar tumor, assess for malignancy. Questionable right scrotal calcification on CT. EXAM: ULTRASOUND OF SCROTUM TECHNIQUE: Complete ultrasound examination of the testicles, epididymis, and other scrotal structures was performed. COMPARISON:  Abdominopelvic CT earlier today. FINDINGS: Right testicle Measurements: 4.2 x 2.2 x 3.0 cm. Homogeneous echogenicity. Blood flow is noted. No mass or microlithiasis visualized. No definite scrotal calcification is seen. Left testicle Measurements: 4.0 x 2.0 x 2.6 cm. Homogeneous echogenicity. Blood flow is noted. No mass or microlithiasis visualized. Right epididymis:  Normal in size and appearance. Left epididymis: Normal in size. Tiny incidental epididymal head cyst measures 3 mm. Hydrocele:  Trace bilaterally. Varicocele:  None visualized. IMPRESSION: Trace bilateral hydroceles. No evidence for testicular mass or other significant abnormality. Electronically Signed   By: Keith Rake M.D.   On: 03/07/2021 01:06   CT ABDOMEN PELVIS W CONTRAST  Result Date: 03/06/2021 CLINICAL DATA:  Cancer of unknown primary.  Brain cancer. EXAM: CT CHEST, ABDOMEN, AND PELVIS WITH CONTRAST TECHNIQUE: Multidetector CT imaging of the chest, abdomen and pelvis was performed following the standard protocol during bolus administration of intravenous contrast. CONTRAST:  71mL  OMNIPAQUE IOHEXOL 350 MG/ML SOLN COMPARISON:  None. FINDINGS: CT CHEST FINDINGS Cardiovascular: No acute vascular findings. Normal caliber thoracic aorta. No central pulmonary embolus to the lobar level on this non dedicated CTA exam. The heart is normal in size. No pericardial effusion or thickening. Mediastinum/Nodes: No enlarged mediastinal, hilar, or axillary lymph nodes. No esophageal wall thickening. No  thyroid nodule. Lungs/Pleura: Clear lungs. No pulmonary nodule or mass. No focal airspace disease. No pleural fluid or thickening. Trachea and central bronchi are patent. Musculoskeletal: No lytic or blastic osseous lesions. No acute osseous findings. No obvious intraspinal abnormality on CT. Unremarkable chest wall soft tissues. CT ABDOMEN PELVIS FINDINGS Hepatobiliary: No focal hepatic lesion or abnormality. Normal liver size and appearance. Normal gallbladder. No biliary dilatation. Pancreas: Unremarkable. No pancreatic ductal dilatation or surrounding inflammatory changes. No pancreatic mass. Spleen: Splenic cleft.  Normal in size.  No focal lesion. Adrenals/Urinary Tract: No adrenal nodule. No focal renal abnormality or mass. Homogeneous renal enhancement. No hydronephrosis. Early excretion of IV contrast in the renal collecting systems, no obvious renal stone. Unremarkable urinary bladder. Layering high-density partially obscures assessment. Stomach/Bowel: Bowel assessment is limited in the absence of enteric contrast. Unremarkable appearance of the stomach. No small bowel mass, obstruction, or inflammation. Normal appendix. Small volume of colonic stool. No visualized colonic mass. Vascular/Lymphatic: Normal caliber abdominal aorta. Patent portal vein. No abdominal, retroperitoneal, mesenteric, or pelvic adenopathy. No enlarged inguinal nodes. Reproductive: Unremarkable prostate gland. Majority of the scrotum is not included in the field of view. There may be a calcification in the upper right hemiscrotum, partially included. Other: No ascites or free fluid. No free air. No omental thickening. Unremarkable body wall soft tissues. Musculoskeletal: No lytic or blastic osseous lesion. Tiny bone islands in the left femoral head. 1 No obvious intraspinal abnormality on CT. IMPRESSION: 1. No evidence of primary malignancy or metastatic disease in the chest, abdomen, or pelvis. 2. Majority of the scrotum is not  included in the field of view. There may be a calcification in the upper right hemiscrotum, partially included. Consider scrotal ultrasound for testicular assessment. Electronically Signed   By: Keith Rake M.D.   On: 03/06/2021 19:19      Swain Acree T. Soham  If 7PM-7AM, please contact night-coverage [email or web address] 03/07/2021, 1:50 PM

## 2021-03-07 NOTE — Consult Note (Signed)
Neurosurgery Consultation  Reason for Consult: Cerebellar tumor Referring Physician: Pearline Cables  CC: Headache  HPI: This is a 25 y.o. man that presents with 2 months of progressive headaches and ataxia. Hasn't had these symptoms before, some mild nausea but no vomiting, no changes in vision, headaches prompted him to go to the ED. No new weakness, numbness, or parasthesias, no recent change in bowel or bladder function. No recent use of anti-platelet or anti-coagulant medications. PMHx/FamHx notable for two siblings with congenital achalasia.    ROS: A 14 point ROS was performed and is negative except as noted in the HPI.   PMHx: History reviewed. No pertinent past medical history. FamHx:  Family History  Family history unknown: Yes   SocHx:  reports that he has never smoked. He has never used smokeless tobacco. He reports current alcohol use. No history on file for drug use.  Exam: Vital signs in last 24 hours: Temp:  [97.6 F (36.4 C)-97.9 F (36.6 C)] 97.6 F (36.4 C) (09/22 0023) Pulse Rate:  [65-98] 71 (09/22 0400) Resp:  [14-18] 16 (09/22 0400) BP: (129-156)/(79-100) 132/79 (09/22 0400) SpO2:  [96 %-100 %] 96 % (09/22 0400) General: Awake, alert, cooperative, lying in bed in NAD Head: Normocephalic and atruamatic HEENT: Neck supple Pulmonary: breathing room air comfortably, no evidence of increased work of breathing Cardiac: RRR Abdomen: S NT ND Extremities: Warm and well perfused x4 Neuro: AOx3, PERRL, EOMI, FS Strength 5/5 x4, SILTx4, mild appendicular ataxia L>R   Assessment and Plan: 25 y.o. man w/ progressive headaches and ataxia. CTH/MRI personally reviewed, which shows a 5cm left cerebellar mass with minimal to no clear enhancement, developing ventriculomegaly.   -continue dexamethasone at 4bid, discussed w/ the pt and his mother at bedside, reviewed the imaging findings, discussed that this needs to be resected to alleviate HCP/mass effect and to get tissue to guide  further treatment, if needed. Will take to the OR on Monday (9/26). If he continues to feel well, I'm okay with him being discharged home for the weekend with his mother to monitor him for any progression of his hydrocephalus -please call with any concerns or questions  Judith Part, MD 03/07/21 8:59 AM [facility]

## 2021-03-07 NOTE — ED Notes (Signed)
Endorses nausea with movement. Pt lying with cloth over eyes. [id number]

## 2021-03-07 NOTE — ED Notes (Signed)
Provider at bedside

## 2021-03-07 NOTE — Evaluation (Signed)
Physical Therapy Evaluation Patient Details Name: Quantavis Obryant MRN: [id number] DOB: 05/27/1996 Today's Date: 03/07/2021  History of Present Illness  Pt is a 25yo male who presented to ED with HA associated with n/v and recent development of incoordination and feeling very off balance. 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 PMH: unremarkable   Clinical Impression  Pt admitted with above. Per chart pt planned for cerebellar tumor resection on Monday 9/26, per mother she thought tomorrow might be a possibility. Pt with significant headache pain and nausea with mobility. Pt also demonstrates impaired co-ordination and is at increased fall risks requiring assistx1 to amb safely. Pt also become very diaphoretic with increase in BP up to 155/94 with OOB mobility. Unsure that pt would be able to withstand a car ride to return home prior to undergoing surgery. Mother present and would also prefer pt to stay due to impaired mobility, pain, and diaphoresis. Acute PT to cont to follow and will re-evaluate patient s/p surgery.        Recommendations for follow up therapy are one component of a multi-disciplinary discharge planning process, led by the attending physician.  Recommendations may be updated based on patient status, additional functional criteria and insurance authorization.  Follow Up Recommendations Home health PT;Supervision/Assistance - 24 hour (will need to re-assess s/p surgery (cerebellar tumor resection))    Equipment Recommendations  None recommended by PT (will reasses s/p surgery)    Recommendations for Other Services       Precautions / Restrictions Precautions Precautions: Fall Precaution Comments: dizziness, nausea, diaphoresis with mobility      Mobility  Bed Mobility Overal bed mobility: Needs Assistance Bed Mobility: Supine to Sit;Sit to Supine     Supine to sit: Min guard Sit to supine: Min guard   General bed mobility comments: instructed on gaze  stabilization to help minimize nausea/dizziness, pt did more of a 1/2 roll to get up to EOB, no physical assist needed, increased time    Transfers Overall transfer level: Needs assistance Equipment used: 1 person hand held assist Transfers: Sit to/from Stand Sit to Stand: Min assist         General transfer comment: minA to steady due to impaired balance and squinting eyes due to light sensitivity  Ambulation/Gait Ambulation/Gait assistance: Min assist Gait Distance (Feet): 20 Feet (x2) Assistive device: 1 person hand held assist Gait Pattern/deviations: Decreased step length - right;Decreased stride length;Staggering left;Staggering right Gait velocity: dec   General Gait Details: pt unsteady with wide base of support and kept head looking down, pt amb to bathroom and back.  Stairs            Wheelchair Mobility    Modified Rankin (Stroke Patients Only)       Balance Overall balance assessment: Needs assistance Sitting-balance support: Feet supported;Bilateral upper extremity supported Sitting balance-Leahy Scale: Fair Sitting balance - Comments: pt sat with elbows on knees looking at floor to minimize nausea   Standing balance support: Single extremity supported;During functional activity Standing balance-Leahy Scale: Fair Standing balance comment: pt attempted to stand at [facility] however pt swaying ant/post and becoming hot and diaphoretic, pt assisted to sitting oncommode                             Pertinent Vitals/Pain Pain Assessment: 0-10 Pain Score: 9  Pain Location: back of head Pain Descriptors / Indicators: Headache Pain Intervention(s): Patient requesting pain meds-RN notified  Home Living Family/patient expects to be discharged to:: Private residence Living Arrangements: Spouse/significant other (with girlfriend) Available Help at Discharge: Family;Available 24 hours/day (mother flu in from [place] and will be here as long as  needed) Type of Home: House Home Access: Stairs to enter Entrance Stairs-Rails: Right;Left (to wide to reach both) Entrance Stairs-Number of Steps: 3 Home Layout: One level Home Equipment: None      Prior Function Level of Independence: Independent         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   Dominant Hand: Right    Extremity/Trunk Assessment   Upper Extremity Assessment Upper Extremity Assessment: Overall WFL for tasks assessed    Lower Extremity Assessment Lower Extremity Assessment: Generalized weakness (with noted impaired gross co-ordination as expected with cerebellar tumor)    Cervical / Trunk Assessment Cervical / Trunk Assessment: Normal  Communication   Communication: No difficulties  Cognition Arousal/Alertness: Awake/alert Behavior During Therapy: WFL for tasks assessed/performed Overall Cognitive Status: Within Functional Limits for tasks assessed                                        General Comments General comments (skin integrity, edema, etc.): pt became very diaphoretic and hot while on the commode, dripping sweat, upon return to room BP taken at 155/94, RN made aware, upon laying down pt with decreased diaphoresis and reports feeling better but continues to have 9/10 post head pain    Exercises     Assessment/Plan    PT Assessment Patient needs continued PT services  PT Problem List Decreased strength;Decreased range of motion;Decreased activity tolerance;Decreased balance;Decreased mobility;Decreased coordination       PT Treatment Interventions DME instruction;Stair training;Gait training;Functional mobility training;Therapeutic exercise;Therapeutic activities;Balance training;Neuromuscular re-education;Cognitive remediation    PT Goals (Current goals can be found in the Care Plan section)  Acute Rehab PT Goals Patient Stated Goal: feel better PT Goal Formulation: With patient Time For Goal  Achievement: 03/22/21 Potential to Achieve Goals: Good    Frequency Min 4X/week   Barriers to discharge        Co-evaluation               AM-PAC PT "6 Clicks" Mobility  Outcome Measure Help needed turning from your back to your side while in a flat bed without using bedrails?: A Little Help needed moving from lying on your back to sitting on the side of a flat bed without using bedrails?: A Little Help needed moving to and from a bed to a chair (including a wheelchair)?: A Little Help needed standing up from a chair using your arms (e.g., wheelchair or bedside chair)?: A Little Help needed to walk in hospital room?: A Little Help needed climbing 3-5 steps with a railing? : A Lot 6 Click Score: 17    End of Session Equipment Utilized During Treatment: Gait belt Activity Tolerance: Patient limited by pain Patient left: in bed;with call bell/phone within reach;with family/visitor present Nurse Communication: Mobility status;Patient requests pain meds (pts elevated BP and diaphoresis) PT Visit Diagnosis: Unsteadiness on feet (R26.81);Muscle weakness (generalized) (M62.81);Difficulty in walking, not elsewhere classified (R26.2)    Time: 1000-1031 PT Time Calculation (min) (ACUTE ONLY): 31 min   Charges:   PT Evaluation $PT Eval Moderate Complexity: 1 Mod PT Treatments $Gait Training: 8-22 mins        Kamali Nephew, PT, DPT  Acute Rehabilitation Services Pager #: [phone] Office #: [phone]   Berline Lopes 03/07/2021, 10:58 AM

## 2021-03-07 NOTE — Progress Notes (Signed)
HOSPITAL MEDICINE OVERNIGHT EVENT NOTE    Discussed plan of care with Dr. Zada Finders with neurosurgery shortly after patient arrival to the [facility] ED.  Patient was transferred as an ED to ED transfer from [facility] due to lack of inpatient beds.  I have reported to Dr. Zada Finders the patient's worsening headache.  Dr. Venetia Constable recommended administering 10 mg of intravenous Decadron.  Continuing serial neurologic checks.  According to neurosurgery, patient can eat this morning as there is no plan to go to the OR today.  Neurosurgery will physically evaluate the patient later this morning.  Vernelle Emerald  MD [facility]

## 2021-03-07 NOTE — ED Notes (Signed)
PT at bedside.

## 2021-03-08 ENCOUNTER — Inpatient Hospital Stay (HOSPITAL_COMMUNITY): Payer: BLUE CROSS/BLUE SHIELD

## 2021-03-08 DIAGNOSIS — D496 Neoplasm of unspecified behavior of brain: Secondary | ICD-10-CM | POA: Diagnosis not present

## 2021-03-08 IMAGING — CT CT HEAD W/O CM
4 series · 15 of 47 positions shown, 17 images · non-contrast
Comparison: Brain MRI [DATE].  Head CT [DATE].

CLINICAL DATA: Hydrocephalus.

EXAM:
CT HEAD WITHOUT CONTRAST
TECHNIQUE: Contiguous axial images were obtained from the base of the skull
through the vertex without intravenous contrast.

[Series 2: head wo · axial · 0.39mm/px · z∈[-428,-308]mm · 7 of 34 slices shown, 9 images]
[im 5/34  brain]
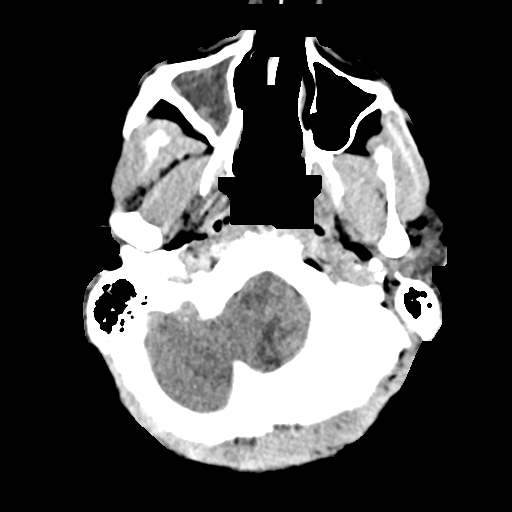
[im 5/34  bone]
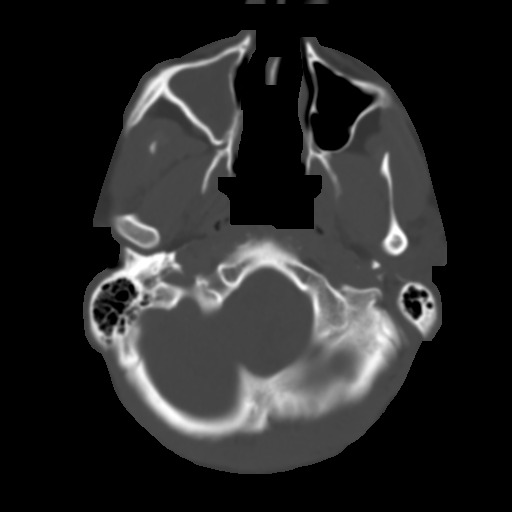
[im 9/34  brain]
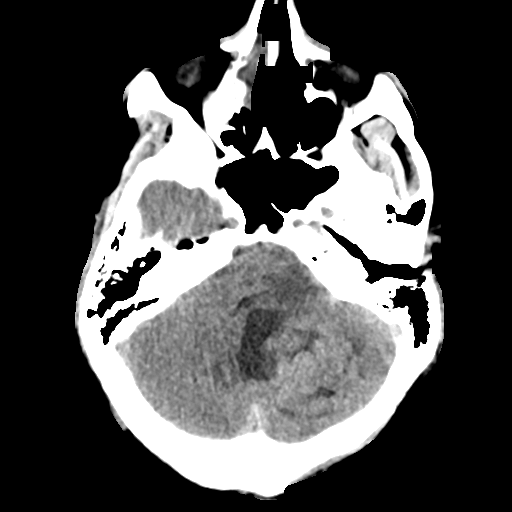
[im 13/34  brain]
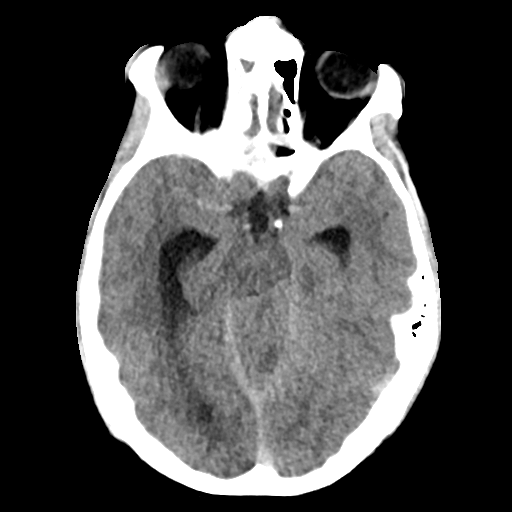
[im 17/34  brain]
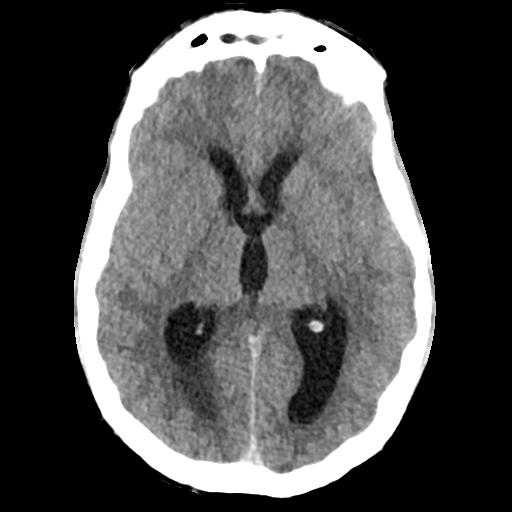
[im 21/34  brain]
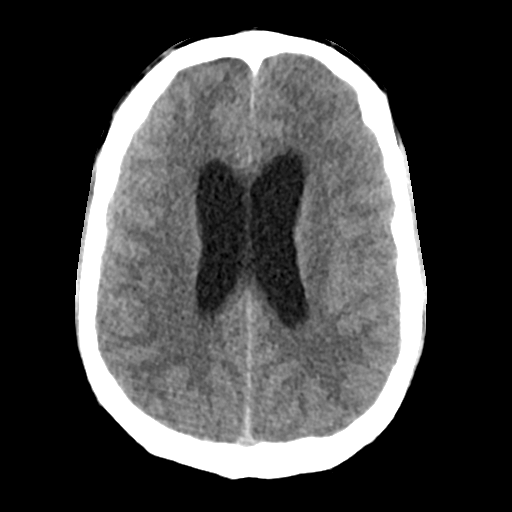
[im 21/34  bone]
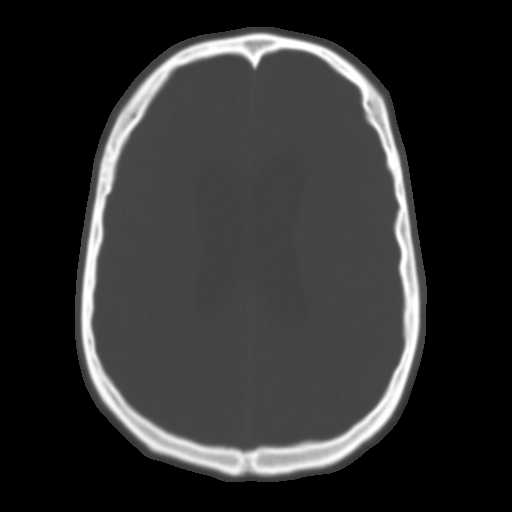
[im 25/34  brain]
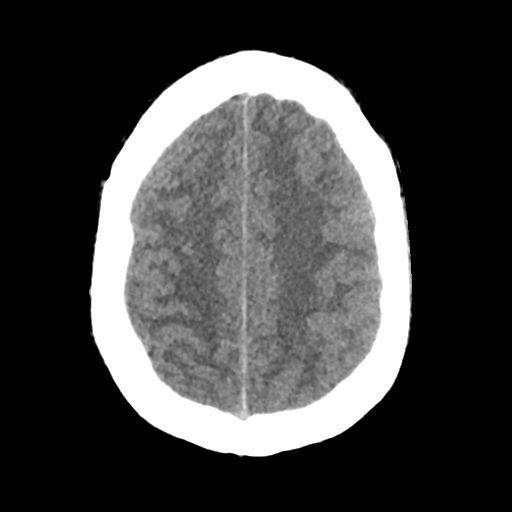
[im 29/34  brain]
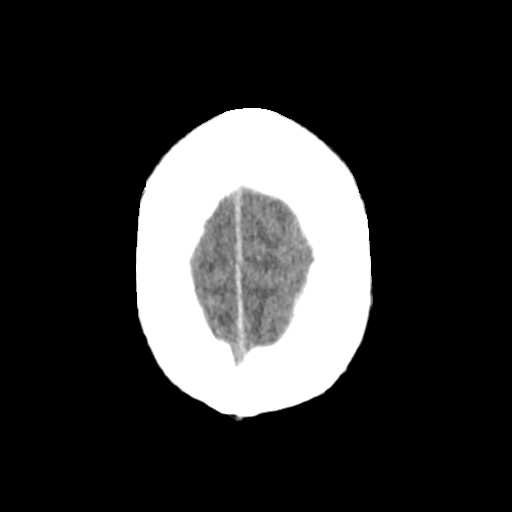

[Series 3: head bone · axial · 0.39mm/px · z∈[-432,-416]mm · 2 of 85 slices shown]
[im 9/85  bone]
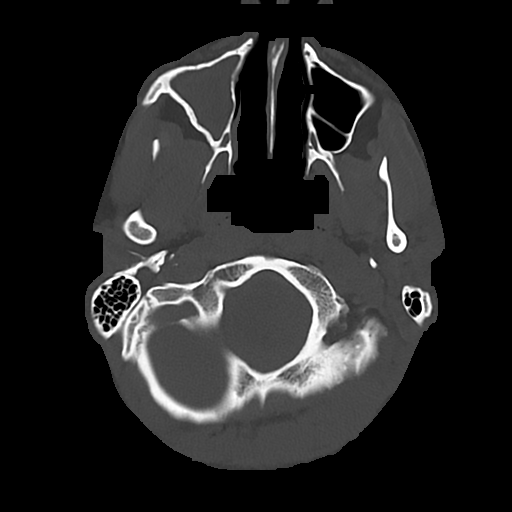
[im 17/85  bone]
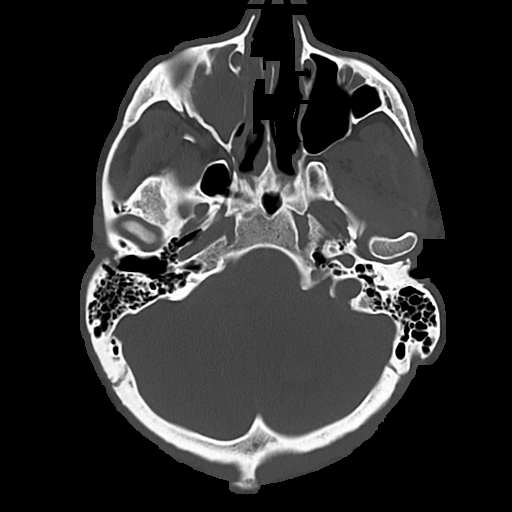

[Series 4: cor soft · coronal · 0.33mm/px · 3 of 72 slices shown]
[im 24/72  brain]
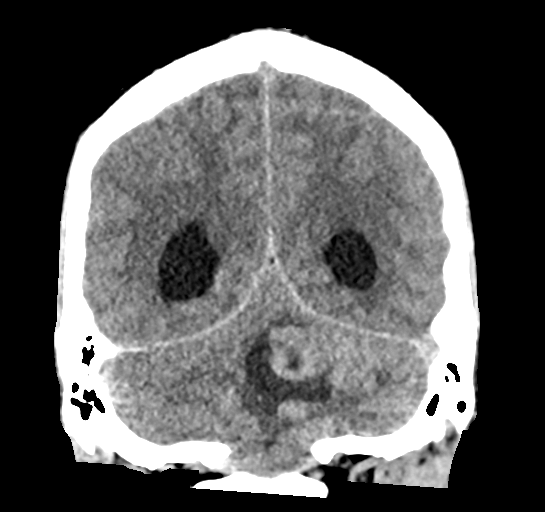
[im 32/72  brain]
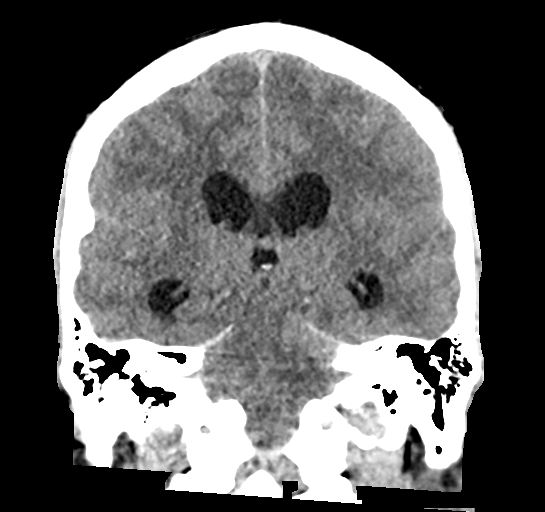
[im 40/72  brain]
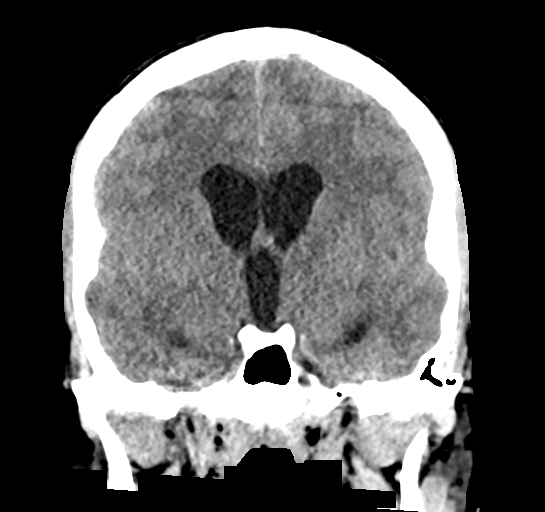

[Series 5: sag soft · sagittal · 0.34mm/px · 3 of 58 slices shown]
[im 20/58  brain]
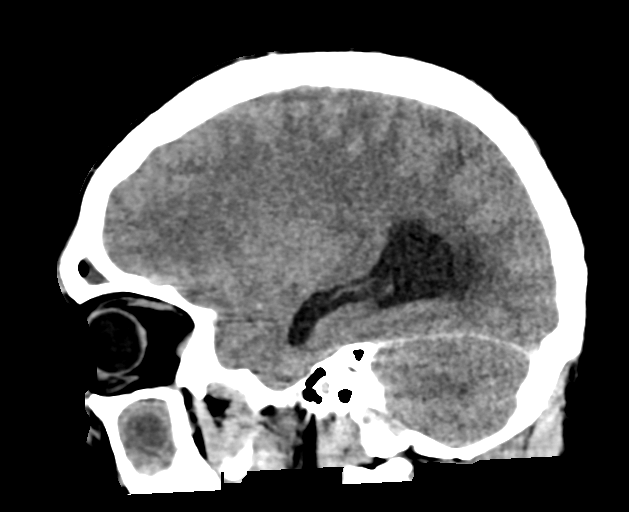
[im 29/58  brain]
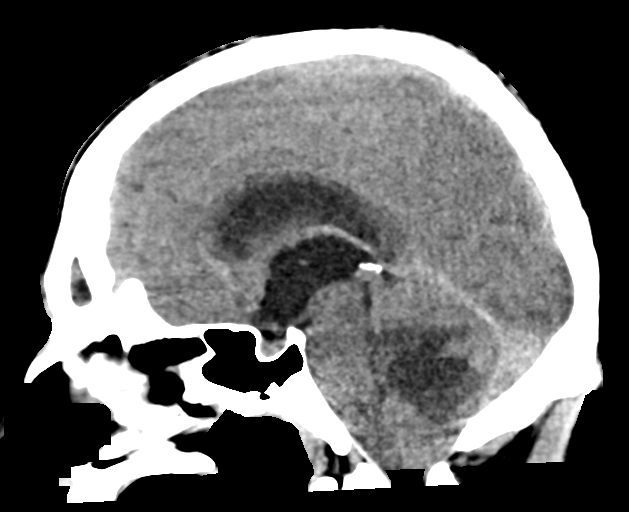
[im 39/58  brain]
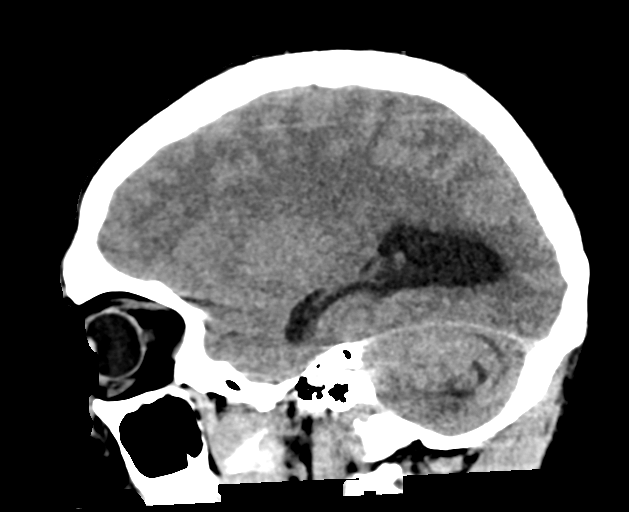

[15 of 47 positions shown; findings below may reference images not displayed]

FINDINGS: Brain:

Grossly unchanged mass centered within the left cerebellar
hemisphere. On the prior brain MRI of [DATE], this mass measured
5.4 x 3.6 x 3.5 cm. Unchanged severe posterior fossa mass effect
with effacement of the fourth ventricle. As before, there is mass
effect upon the brainstem and inferior cerebellar tonsillar
extension below the level of the foramen magnum by 11 mm. Change,
there is some degree of upward transtentorial herniation. As before,
there is obstructive hydrocephalus with lateral and third
ventriculomegaly and diffuse cerebral sulcal effacement. Lateral and
third ventriculomegaly is similar to minimally increased as compared
to the prior brain MRI of [DATE]. For instance, the transverse
dimension of the anterior aspect of the third ventricle previously
measured 10 mm, and now measures 11 mm. Not significantly changed,
there is mild transependymal flow of CSF.

No evidence of acute intracranial hemorrhage. No acute demarcated
cortical infarction. No extra-axial collection. No supratentorial
midline shift.

Vascular: No hyperdense vessel.

Skull: Normal. Negative for fracture or focal lesion.

Sinuses/Orbits: Visualized orbits show no acute finding. As before,
there is near complete opacification of the right frontal sinus,
extensive partial opacification of the anterior right ethmoid air
cells and complete opacification of the right maxillary sinus (right
ostiomeatal unit obstructive pattern. Small right sphenoid sinus
mucous retention cyst.

Impression #2 will be called to the ordering clinician or
representative by the Radiologist Assistant, and communication
documented in the PACS or [REDACTED].
IMPRESSION: Grossly unchanged large mass centered within the left cerebellar
hemisphere. Similar to the prior brain MRI of [DATE], there is
severe posterior fossa mass effect with effacement of the fourth
ventricle, mass effect upon the brainstem and inferior displacement
of the cerebellar tonsils by 11 mm. There is also some degree of
upward transtentorial herniation, also not significantly changed.

Persistent obstructive hydrocephalus. Lateral and third
ventriculomegaly is similar to slightly increased as compared to the
prior MRI. For instance, the transverse dimension of the anterior
third ventricle now measures 11 mm (previously 10 mm). Mild
transependymal flow of CSF, unchanged. Persistent diffuse cerebral
sulcal effacement.

Severe right frontal, anterior ethmoid and maxillary sinus disease
(right ostiomeatal unit obstructive pattern).

## 2021-03-08 MED ORDER — OXYCODONE HCL 5 MG PO TABS: 5.0000 mg | ORAL_TABLET | ORAL | Status: DC | PRN

## 2021-03-08 MED ORDER — DEXAMETHASONE SODIUM PHOSPHATE 10 MG/ML IJ SOLN
8.0000 mg | Freq: Three times a day (TID) | INTRAMUSCULAR | Status: DC
  Administered 2021-03-08 – 2021-03-11 (×9): 8 mg via INTRAVENOUS
  Administered 2021-03-11: 10 mg via INTRAVENOUS
  Administered 2021-03-12 – 2021-03-13 (×4): 8 mg via INTRAVENOUS
  Filled 2021-03-08: qty 1
  Filled 2021-03-08 (×2): qty 0.8
  Filled 2021-03-08 (×2): qty 1
  Filled 2021-03-08 (×4): qty 0.8
  Filled 2021-03-08: qty 1
  Filled 2021-03-08 (×3): qty 0.8
  Filled 2021-03-08: qty 1

## 2021-03-08 MED ORDER — ACETAZOLAMIDE 250 MG PO TABS
500.0000 mg | ORAL_TABLET | Freq: Two times a day (BID) | ORAL | Status: DC
  Administered 2021-03-08 – 2021-03-11 (×7): 500 mg via ORAL
  Filled 2021-03-08 (×10): qty 2

## 2021-03-08 MED ORDER — HYDROMORPHONE HCL 1 MG/ML IJ SOLN
1.0000 mg | INTRAMUSCULAR | Status: AC | PRN
  Administered 2021-03-08: 1 mg via INTRAVENOUS
  Filled 2021-03-08: qty 1

## 2021-03-08 MED ORDER — OXYCODONE HCL 5 MG PO TABS
10.0000 mg | ORAL_TABLET | ORAL | Status: DC | PRN
Start: 2021-03-08 — End: 2021-03-11
  Administered 2021-03-08: 10 mg via ORAL
  Filled 2021-03-08: qty 2

## 2021-03-08 MED ORDER — HYDROMORPHONE HCL 1 MG/ML IJ SOLN
1.0000 mg | INTRAMUSCULAR | Status: DC | PRN
  Administered 2021-03-08 – 2021-03-10 (×8): 1 mg via INTRAVENOUS
  Filled 2021-03-08 (×7): qty 1

## 2021-03-08 MED ORDER — HYDROMORPHONE HCL 1 MG/ML IJ SOLN
INTRAMUSCULAR | Status: AC
  Administered 2021-03-08: 1 mg
  Filled 2021-03-08: qty 1

## 2021-03-08 NOTE — Progress Notes (Signed)
PROGRESS NOTE  Scott Snyder GXQ:[id number] DOB: 1995/08/23   PCP: Patient, No Pcp Per (Inactive)  Patient is from: Home.  DOA: 03/06/2021 LOS: 2  Chief complaints:  Chief Complaint  Patient presents with   Headache     Brief Narrative / Interim history: 25 year old M with no PMH returned to ED with 2 months of progressive throbbing headache with associated nausea, vomiting and ataxia and found to have cerebellar mass measuring about 5 cm with mass-effect, and admitted.  Neurosurgery consulted and following.  Subjective: -Patient seen alongside patient's parents and brother.   -Worsening headache with associated nausea and vomiting noted.    Objective: Vitals:   03/07/21 2324 03/08/21 0600 03/08/21 1117 03/08/21 1619  BP: 139/69 (!) 158/97 139/77 (!) 141/78  Pulse: 70 66 (!) 55 81  Resp: 14 16 19 19   Temp: 98.6 F (37 C) 97.7 F (36.5 C) (!) 97.5 F (36.4 C) 98.4 F (36.9 C)  TempSrc: Oral Oral Oral Oral  SpO2: 100% 100% 100% 100%    Intake/Output Summary (Last 24 hours) at 03/08/2021 1754 Last data filed at 03/08/2021 0602 Gross per 24 hour  Intake 1100 ml  Output 500 ml  Net 600 ml   There were no vitals filed for this visit.  Examination:  GENERAL: No apparent distress.  Nontoxic. HEENT: MMM.  Vision and hearing grossly intact.  NECK: Supple.  No apparent JVD.  RESP: On RA.  No IWOB.  Fair aeration bilaterally. CVS:  RRR. Heart sounds normal.  ABD/GI/GU: BS+. Abd soft, NTND.  MSK/EXT:  Moves extremities. No apparent deformity. No edema.  SKIN: no apparent skin lesion or wound NEURO: Awake, alert and oriented appropriately. Speech clear. Cranial nerves II-XII  intact. Motor 5/5 in all muscle groups of UE and LE bilaterally, Normal tone. Light sensation intact in all dermatomes of upper and lower ext bilaterally. Patellar reflex symmetric.  No pronator drift.  Finger to nose intact. PSYCH: Calm. Normal affect.   Procedures:  None  Microbiology  summarized: [id number] and influenza PCR nonreactive.  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 03/08/2021: Increase IV Decadron from 4 mg twice daily to 8 mg every 8 hourly.  Communicated with the neurosurgical team, CT head is planned.  Transfer patient to a telemetry bed.  Nausea and vomiting: Likely due to the above. -Decadron as above -Antiemetics -Gentle IV fluid until nausea and emesis subsides.  Mild hyponatremia: NA 134. -Change LR to NS  Mild hyperglycemia: Likely due to steroid. -Continue monitoring   There is no height or weight on file to calculate BMI.         DVT prophylaxis:  Place and maintain sequential compression device Start: 03/07/21 1351  SCD  Code Status: Full code Family Communication: Updated patient's mother at bedside. Level of care: Telemetry Medical Status is: Inpatient  Remains inpatient appropriate because:Ongoing diagnostic testing needed not appropriate for outpatient work up, IV treatments appropriate due to intensity of illness or inability to take PO, and Inpatient level of care appropriate due to severity of illness  Dispo: The patient is from: Home              Anticipated d/c is to: Home              Patient currently is not medically stable to d/c.  Difficult to place patient No       Consultants:  Neurosurgery   Sch Meds:  Scheduled Meds:  acetaZOLAMIDE  500 mg Oral BID   dexamethasone (DECADRON) injection  8 mg Intravenous Q8H   Continuous Infusions:   PRN Meds:.acetaminophen **OR** acetaminophen, HYDROmorphone (DILAUDID) injection, ondansetron **OR** ondansetron (ZOFRAN) IV, oxyCODONE, oxyCODONE, senna-docusate  Antimicrobials: Anti-infectives (From admission, onward)     None        I have personally reviewed the following labs and images: CBC: Recent Labs  Lab 03/06/21 1321 03/07/21 0420  WBC 9.8 8.4  NEUTROABS 7.7  --   HGB 16.1 15.7  HCT 46.8 45.3  MCV 88.5 89.3  PLT 308 269    BMP &GFR Recent Labs  Lab 03/06/21 1321 03/07/21 0420  NA 137 134*  K 3.9 4.2  CL 101 101  CO2 28 23  GLUCOSE 111* 120*  BUN 11 11  CREATININE 0.93 0.92  CALCIUM 9.9 9.8    CrCl cannot be calculated (Unknown ideal weight.). Liver & Pancreas: Recent Labs  Lab 03/06/21 1321 03/07/21 0420  AST 17 14*  ALT 15 14  ALKPHOS 44 40  BILITOT 1.0 1.0  PROT 8.5* 7.3  ALBUMIN 5.1* 4.4    No results for input(s): LIPASE, AMYLASE in the last 168 hours. No results for input(s): AMMONIA in the last 168 hours. Diabetic: No results for input(s): HGBA1C in the last 72 hours. No results for input(s): GLUCAP in the last 168 hours. Cardiac Enzymes: No results for input(s): CKTOTAL, CKMB, CKMBINDEX, TROPONINI in the last 168 hours. No results for input(s): PROBNP in the last 8760 hours. Coagulation Profile: No results for input(s): INR, PROTIME in the last 168 hours. Thyroid Function Tests: No results for input(s): TSH, T4TOTAL, FREET4, T3FREE, THYROIDAB in the last 72 hours. Lipid Profile: No results for input(s): CHOL, HDL, LDLCALC, TRIG, CHOLHDL, LDLDIRECT in the last 72 hours. Anemia Panel: No results for input(s): VITAMINB12, FOLATE, FERRITIN, TIBC, IRON, RETICCTPCT in the last 72 hours. Urine analysis:    Component Value Date/Time   COLORURINE AMBER (A) 07/10/2017 2043   APPEARANCEUR HAZY (A) 07/10/2017 2043   LABSPEC 1.030 07/10/2017 2043   PHURINE 5.0 07/10/2017 2043   GLUCOSEU 50 (A) 07/10/2017 2043   HGBUR MODERATE (A) 07/10/2017 2043   BILIRUBINUR SMALL (A) 07/10/2017 2043   KETONESUR 5 (A) 07/10/2017 2043   PROTEINUR 100 (A) 07/10/2017 2043   NITRITE NEGATIVE 07/10/2017 2043   LEUKOCYTESUR NEGATIVE 07/10/2017 2043   Sepsis  Labs: Invalid input(s): PROCALCITONIN, [place]  Microbiology: Recent Results (from the past 240 hour(s))  SARS CORONAVIRUS 2 (TAT 6-24 HRS) Nasopharyngeal Nasopharyngeal Swab     Status: None   Collection Time: 03/06/21  9:56 PM   Specimen: Nasopharyngeal Swab  Result Value Ref Range Status   SARS Coronavirus 2 NEGATIVE NEGATIVE Final    Comment: (NOTE) SARS-CoV-2 target nucleic acids are NOT DETECTED.  The SARS-CoV-2 RNA is generally detectable in upper and lower respiratory specimens during the acute phase of infection. Negative results do not preclude SARS-CoV-2 infection, do not rule out co-infections with other pathogens, and should not be used as the sole basis for treatment or other patient management decisions. Negative results must be combined with clinical observations, patient history, and epidemiological information. The expected result is Negative.  Fact Sheet for Patients: SugarRoll.be  Fact Sheet for Healthcare Providers: https://www.woods-mathews.com/  This test is not yet approved or cleared by the [place] FDA and  has been authorized for  detection and/or diagnosis of SARS-CoV-2 by FDA under an Emergency Use Authorization (EUA). This EUA will remain  in effect (meaning this test can be used) for the duration of the COVID-19 declaration under Se ction 564(b)(1) of the Act, 21 U.S.C. section 360bbb-3(b)(1), unless the authorization is terminated or revoked sooner.  Performed at [facility], [place] [address] [place], [place] [address]     Radiology Studies: No results found.   Dana Allan, MD [facility]  If 7PM-7AM, please contact night-coverage [email or web address] 03/08/2021, 5:54 PM

## 2021-03-08 NOTE — Progress Notes (Signed)
Neurosurgery Service Progress Note  Subjective: No acute events overnight, continued headaches, no N/V   Objective: Vitals:   03/07/21 1837 03/07/21 1948 03/07/21 2324 03/08/21 0600  BP: (!) 148/90 (!) 155/86 139/69 (!) 158/97  Pulse: 79 81 70 66  Resp: 16 16 14 16   Temp: 98.2 F (36.8 C) 98.5 F (36.9 C) 98.6 F (37 C) 97.7 F (36.5 C)  TempSrc: Oral Oral Oral Oral  SpO2: 100% 99% 100% 100%    Physical Exam: AOx3, PERRL, EOMI, FS, TM, Strength 5/5 x4, SILTx4, +appendicular ataxia  Assessment & Plan: 25 y.o. 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  -will add diamox to help with headaches, warned him to expect some paresthesias -continue dex -will add some oral pain options -OR 9/26, likely will need to stay inpatient until then given the severity of his symptoms  Judith Part  03/08/21 8:15 AM

## 2021-03-09 DIAGNOSIS — D496 Neoplasm of unspecified behavior of brain: Secondary | ICD-10-CM | POA: Diagnosis not present

## 2021-03-09 NOTE — Progress Notes (Signed)
PROGRESS NOTE  Scott Snyder DJM:[id number] DOB: 02/25/1996   PCP: Patient, No Pcp Per (Inactive)  Patient is from: Home.  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  Neurosurgery consulted and following.  03/09/2021: Patient is more comfortable today.  Patient is resting quietly.  Patient was seen alongside patient's mother and nurse.  Subjective: -Patient's symptoms have improved significantly. -Patient is resting quietly.   Objective: Vitals:   03/08/21 1619 03/08/21 2349 03/09/21 0554 03/09/21 1212  BP: (!) 141/78 139/83 138/90 135/86  Pulse: 81 69 62 60  Resp: 19 16 16 16   Temp: 98.4 F (36.9 C) 98.2 F (36.8 C) 98 F (36.7 C) 97.9 F (36.6 C)  TempSrc: Oral Oral Oral Oral  SpO2: 100% 98% 100% 100%   No intake or output data in the 24 hours ending 03/09/21 1736  There were no vitals filed for this visit.  Examination:  GENERAL: No apparent distress.  Nontoxic. HEENT: MMM.  Vision and hearing grossly intact.  NECK: Supple.  No apparent JVD.  RESP: On RA.  No IWOB.  Fair aeration bilaterally. CVS:  RRR. Heart sounds normal.  ABD/GI/GU: BS+. Abd soft, NTND.  MSK/EXT:  Moves extremities. No apparent deformity. No edema.  SKIN: no apparent skin lesion or wound NEURO: Awake, alert and oriented appropriately. Speech clear. Cranial nerves II-XII  intact. Motor 5/5 in all muscle groups of UE and LE bilaterally, Normal tone. Light sensation intact in all dermatomes of upper and lower ext bilaterally. Patellar reflex symmetric.  No pronator drift.  Finger to nose intact. PSYCH: Calm. Normal affect.   Procedures:  None  Microbiology summarized: [id number] and  influenza PCR nonreactive.  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 03/08/2021: Increase IV Decadron from 4 mg twice daily to 8 mg every 8 hourly.  Communicated with the neurosurgical team, CT head is planned.  Transfer patient to a telemetry bed.  Nausea and vomiting: Likely due to the above. -Continue IV Decadron 8 Mg every 8 hours.   -Antiemetics  Mild hyponatremia: NA 134. -Change LR to NS  Mild hyperglycemia: Likely due to steroid. -Continue monitoring   There is no height or weight on file to calculate BMI.         DVT prophylaxis:  Place and maintain sequential compression device Start: 03/07/21 1351  SCD  Code Status: Full code Family Communication: Updated patient's mother at bedside. Level of care: Telemetry Medical Status is: Inpatient  Remains inpatient appropriate because:Ongoing diagnostic testing needed not appropriate for outpatient work up, IV treatments appropriate due to intensity of illness or inability to take PO, and Inpatient level of care appropriate due to severity of illness  Dispo: The patient is from: Home              Anticipated d/c is to: Home              Patient currently is not medically stable to d/c.   Difficult to place patient No  Consultants:  Neurosurgery   Sch Meds:  Scheduled Meds:  acetaZOLAMIDE  500 mg Oral BID   dexamethasone (DECADRON) injection  8 mg Intravenous Q8H   Continuous Infusions:   PRN Meds:.acetaminophen **OR** acetaminophen, HYDROmorphone (DILAUDID) injection, ondansetron **OR** ondansetron (ZOFRAN) IV, oxyCODONE, oxyCODONE, senna-docusate  Antimicrobials: Anti-infectives (From admission, onward)    None        I have personally reviewed the  following labs and images: CBC: Recent Labs  Lab 03/06/21 1321 03/07/21 0420  WBC 9.8 8.4  NEUTROABS 7.7  --   HGB 16.1 15.7  HCT 46.8 45.3  MCV 88.5 89.3  PLT 308 269    BMP &GFR Recent Labs  Lab 03/06/21 1321 03/07/21 0420  NA 137 134*  K 3.9 4.2  CL 101 101  CO2 28 23  GLUCOSE 111* 120*  BUN 11 11  CREATININE 0.93 0.92  CALCIUM 9.9 9.8    CrCl cannot be calculated (Unknown ideal weight.). Liver & Pancreas: Recent Labs  Lab 03/06/21 1321 03/07/21 0420  AST 17 14*  ALT 15 14  ALKPHOS 44 40  BILITOT 1.0 1.0  PROT 8.5* 7.3  ALBUMIN 5.1* 4.4    No results for input(s): LIPASE, AMYLASE in the last 168 hours. No results for input(s): AMMONIA in the last 168 hours. Diabetic: No results for input(s): HGBA1C in the last 72 hours. No results for input(s): GLUCAP in the last 168 hours. Cardiac Enzymes: No results for input(s): CKTOTAL, CKMB, CKMBINDEX, TROPONINI in the last 168 hours. No results for input(s): PROBNP in the last 8760 hours. Coagulation Profile: No results for input(s): INR, PROTIME in the last 168 hours. Thyroid Function Tests: No results for input(s): TSH, T4TOTAL, FREET4, T3FREE, THYROIDAB in the last 72 hours. Lipid Profile: No results for input(s): CHOL, HDL, LDLCALC, TRIG, CHOLHDL, LDLDIRECT in the last 72 hours. Anemia Panel: No results for input(s): VITAMINB12, FOLATE, FERRITIN, TIBC, IRON, RETICCTPCT in the last 72 hours. Urine analysis:    Component Value Date/Time   COLORURINE AMBER (A) 07/10/2017 2043   APPEARANCEUR HAZY (A) 07/10/2017 2043   LABSPEC 1.030 07/10/2017 2043   PHURINE 5.0 07/10/2017 2043   GLUCOSEU 50 (A) 07/10/2017 2043   HGBUR MODERATE (A) 07/10/2017 2043   BILIRUBINUR SMALL (A) 07/10/2017 2043   KETONESUR 5 (A) 07/10/2017 2043   PROTEINUR 100 (A) 07/10/2017 2043   NITRITE NEGATIVE 07/10/2017 2043   LEUKOCYTESUR NEGATIVE 07/10/2017 2043   Sepsis Labs: Invalid input(s): PROCALCITONIN,  [place]  Microbiology: Recent Results (from the past 240 hour(s))  SARS CORONAVIRUS 2 (TAT 6-24 HRS) Nasopharyngeal Nasopharyngeal Swab     Status: None   Collection Time: 03/06/21  9:56 PM   Specimen: Nasopharyngeal Swab  Result Value Ref Range Status   SARS Coronavirus 2 NEGATIVE NEGATIVE Final    Comment: (NOTE) SARS-CoV-2 target nucleic acids are NOT DETECTED.  The SARS-CoV-2 RNA is generally detectable in upper and lower respiratory specimens during the acute phase of infection. Negative results do not preclude SARS-CoV-2 infection, do not rule out co-infections with other pathogens, and should not be used as the sole basis for treatment or other patient management decisions. Negative results must be combined with clinical observations, patient history, and epidemiological information. The expected result is Negative.  Fact Sheet for Patients: SugarRoll.be  Fact Sheet for Healthcare Providers: https://www.woods-mathews.com/  This test is not yet approved or cleared by the [place] FDA and  has been authorized for detection and/or diagnosis of SARS-CoV-2 by FDA under an Emergency Use  Authorization (EUA). This EUA will remain  in effect (meaning this test can be used) for the duration of the COVID-19 declaration under Se ction 564(b)(1) of the Act, 21 U.S.C. section 360bbb-3(b)(1), unless the authorization is terminated or revoked sooner.  Performed at [facility], [place] [address], [place], [place] [address]     Radiology Studies: CT HEAD WO CONTRAST (5MM)  Result Date: 03/08/2021 CLINICAL DATA:  Hydrocephalus. EXAM: CT HEAD WITHOUT CONTRAST TECHNIQUE: Contiguous axial images were obtained from the base of the skull through the vertex without intravenous contrast. COMPARISON:  Brain MRI 03/06/2021.  Head CT 03/06/2021. FINDINGS: Brain: Grossly unchanged mass centered within the left cerebellar hemisphere. On the  prior brain MRI of 03/06/2021, this mass measured 5.4 x 3.6 x 3.5 cm. Unchanged severe posterior fossa mass effect with effacement of the fourth ventricle. As before, there is mass effect upon the brainstem and inferior cerebellar tonsillar extension below the level of the foramen magnum by 11 mm. Change, there is some degree of upward transtentorial herniation. As before, there is obstructive hydrocephalus with lateral and third ventriculomegaly and diffuse cerebral sulcal effacement. Lateral and third ventriculomegaly is similar to minimally increased as compared to the prior brain MRI of 03/06/2021. For instance, the transverse dimension of the anterior aspect of the third ventricle previously measured 10 mm, and now measures 11 mm. Not significantly changed, there is mild transependymal flow of CSF. No evidence of acute intracranial hemorrhage. No acute demarcated cortical infarction. No extra-axial collection. No supratentorial midline shift. Vascular: No hyperdense vessel. Skull: Normal. Negative for fracture or focal lesion. Sinuses/Orbits: Visualized orbits show no acute finding. As before, there is near complete opacification of the right frontal sinus, extensive partial opacification of the anterior right ethmoid air cells and complete opacification of the right maxillary sinus (right ostiomeatal unit obstructive pattern. Small right sphenoid sinus mucous retention cyst. Impression #2 will be called to the ordering clinician or representative by the Radiologist Assistant, and communication documented in the PACS or [employer]. IMPRESSION: Grossly unchanged large mass centered within the left cerebellar hemisphere. Similar to the prior brain MRI of 03/06/2021, there is severe posterior fossa mass effect with effacement of the fourth ventricle, mass effect upon the brainstem and inferior displacement of the cerebellar tonsils by 11 mm. There is also some degree of upward transtentorial herniation, also  not significantly changed. Persistent obstructive hydrocephalus. Lateral and third ventriculomegaly is similar to slightly increased as compared to the prior MRI. For instance, the transverse dimension of the anterior third ventricle now measures 11 mm (previously 10 mm). Mild transependymal flow of CSF, unchanged. Persistent diffuse cerebral sulcal effacement. Severe right frontal, anterior ethmoid and maxillary sinus disease (right ostiomeatal unit obstructive pattern). Electronically Signed   By: Kellie Simmering D.O.   On: 03/08/2021 20:43     Dana Allan, MD [facility]  If 7PM-7AM, please contact night-coverage [email or web address] 03/09/2021, 5:36 PM

## 2021-03-09 NOTE — Progress Notes (Signed)
Hospitalist communicated that neurosurgeon aware of sinus pauses, will continue to monitor at this time

## 2021-03-09 NOTE — Progress Notes (Signed)
Pt has been having some sinus pauses as reported by cardiac monitor. Notified hospitalist and advised to consult neuro surgical on call MD. Called neuro office and answered by out of hours service who collected pt information to pass on to MD , waiting for return call

## 2021-03-10 DIAGNOSIS — R001 Bradycardia, unspecified: Secondary | ICD-10-CM | POA: Diagnosis not present

## 2021-03-10 DIAGNOSIS — D496 Neoplasm of unspecified behavior of brain: Secondary | ICD-10-CM | POA: Diagnosis not present

## 2021-03-10 LAB — CBC WITH DIFFERENTIAL/PLATELET
Abs Immature Granulocytes: 0.06 10*3/uL (ref 0.00–0.07)
Basophils Absolute: 0 10*3/uL (ref 0.0–0.1)
Basophils Relative: 0 %
Eosinophils Absolute: 0 10*3/uL (ref 0.0–0.5)
Eosinophils Relative: 0 %
HCT: 45.9 % (ref 39.0–52.0)
Hemoglobin: 16.7 g/dL (ref 13.0–17.0)
Immature Granulocytes: 1 %
Lymphocytes Relative: 11 %
Lymphs Abs: 1 10*3/uL (ref 0.7–4.0)
MCH: 30.8 pg (ref 26.0–34.0)
MCHC: 36.4 g/dL — ABNORMAL HIGH (ref 30.0–36.0)
MCV: 84.7 fL (ref 80.0–100.0)
Monocytes Absolute: 0.7 10*3/uL (ref 0.1–1.0)
Monocytes Relative: 7 %
Neutro Abs: 7.9 10*3/uL — ABNORMAL HIGH (ref 1.7–7.7)
Neutrophils Relative %: 81 %
Platelets: 317 10*3/uL (ref 150–400)
RBC: 5.42 MIL/uL (ref 4.22–5.81)
RDW: 11.1 % — ABNORMAL LOW (ref 11.5–15.5)
WBC: 9.7 10*3/uL (ref 4.0–10.5)
nRBC: 0 % (ref 0.0–0.2)

## 2021-03-10 LAB — RENAL FUNCTION PANEL
Albumin: 4.5 g/dL (ref 3.5–5.0)
Anion gap: 12 (ref 5–15)
BUN: 23 mg/dL — ABNORMAL HIGH (ref 6–20)
CO2: 19 mmol/L — ABNORMAL LOW (ref 22–32)
Calcium: 10 mg/dL (ref 8.9–10.3)
Chloride: 104 mmol/L (ref 98–111)
Creatinine, Ser: 1.21 mg/dL (ref 0.61–1.24)
GFR, Estimated: >60 mL/min (ref >60)
Glucose, Bld: 130 mg/dL — ABNORMAL HIGH (ref 70–99)
Phosphorus: 5 mg/dL — ABNORMAL HIGH (ref 2.5–4.6)
Potassium: 3.6 mmol/L (ref 3.5–5.1)
Sodium: 135 mmol/L (ref 135–145)

## 2021-03-10 LAB — MAGNESIUM: Magnesium: 2.3 mg/dL (ref 1.7–2.4)

## 2021-03-10 MED ORDER — CALCIUM CARBONATE ANTACID 500 MG PO CHEW
1.0000 | CHEWABLE_TABLET | Freq: Two times a day (BID) | ORAL | Status: DC
  Administered 2021-03-10 – 2021-03-14 (×10): 200 mg via ORAL
  Filled 2021-03-10 (×11): qty 1

## 2021-03-10 MED ORDER — APREPITANT 40 MG PO CAPS
40.0000 mg | ORAL_CAPSULE | Freq: Once | ORAL | Status: AC
  Administered 2021-03-11: 40 mg via ORAL
  Filled 2021-03-10: qty 1

## 2021-03-10 NOTE — Progress Notes (Signed)
Patient ID: Scott Snyder, male   DOB: 22-Mar-1996, 25 y.o.   MRN: [id number] BP (!) 142/93 (BP Location: Left Arm)   Pulse 65   Temp 97.7 F (36.5 C) (Oral)   Resp 18   SpO2 98%  Alert and oriented x 4, speech is clear, fluent Moving all extremities well OR tomorrow

## 2021-03-10 NOTE — Anesthesia Preprocedure Evaluation (Addendum)
Anesthesia Evaluation  Patient identified by MRN, date of birth, ID band Patient awake    Reviewed: Allergy & Precautions, NPO status , Patient's Chart, lab work & pertinent test results  Airway Mallampati: I  TM Distance: >3 FB Neck ROM: Full    Dental no notable dental hx. (+) Dental Advisory Given, Teeth Intact   Pulmonary neg pulmonary ROS,    Pulmonary exam normal breath sounds clear to auscultation       Cardiovascular negative cardio ROS Normal cardiovascular exam Rhythm:Regular Rate:Normal     Neuro/Psych  Headaches,    GI/Hepatic negative GI ROS, Neg liver ROS,   Endo/Other  negative endocrine ROS  Renal/GU Renal disease     Musculoskeletal negative musculoskeletal ROS (+)   Abdominal   Peds  Hematology negative hematology ROS (+)   Anesthesia Other Findings   Reproductive/Obstetrics                            Anesthesia Physical Anesthesia Plan  ASA: 2  Anesthesia Plan: General   Post-op Pain Management:    Induction: Intravenous  PONV Risk Score and Plan: 3 and Ondansetron, Aprepitant, Dexamethasone, Propofol infusion, Treatment may vary due to age or medical condition and Midazolam  Airway Management Planned: Oral ETT  Additional Equipment: Arterial line  Intra-op Plan:   Post-operative Plan: Possible Post-op intubation/ventilation  Informed Consent: I have reviewed the patients History and Physical, chart, labs and discussed the procedure including the risks, benefits and alternatives for the proposed anesthesia with the patient or authorized representative who has indicated his/her understanding and acceptance.     Dental advisory given  Plan Discussed with: CRNA  Anesthesia Plan Comments: (2 x PIV)       Anesthesia Quick Evaluation

## 2021-03-10 NOTE — Progress Notes (Signed)
PROGRESS NOTE    Scott Snyder  OFB:[id number] DOB: [birth year] DOA: 03/06/2021 PCP: Patient, No Pcp Per (Inactive)    Brief Narrative:  25 year old with no previous medical problems admitted with 2 months of progressive headache associated with nausea and vomiting and ataxia found to have cerebellar mass 5 cm with mass-effect.  Scheduled for craniotomy 9/26.   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  Obstructive hydrocephalus. Currently on dexamethasone and acetazolamide.  Some clinical improvement today.  Mobilize with supervision.  Can shower with someone accompanying him.  As per neurosurgery.  Scheduled for surgery tomorrow.  Sinus bradycardia: Stable.  Patient had one episode of sinus bradycardia with 2.2-second pause, nonrecurrent.  Not on any rate control medications.  Associated with intracranial neoplasm and vagal symptoms including hiccups.  Keep on telemetry monitor.  Discussed with Dr. Quentin Ore from cardiology, recommended monitoring but no intervention.  DVT prophylaxis: Place and maintain sequential compression device Start: 03/07/21 1351   Code Status: Full code Family Communication: Mother at the bedside Disposition Plan: Status is: Inpatient  Remains inpatient appropriate because:Inpatient level of care appropriate due to severity of illness  Dispo: The patient is from: Home              Anticipated d/c is to: CIR              Patient currently is not medically stable to d/c.   Difficult to place patient No         Consultants:  Neurosurgery  Procedures:  None  Antimicrobials:  None   Subjective: Patient seen and examined.  Mother was at the bedside.  Patient has some hiccups but denies any headache today.  He tells me that he wants to take shower.   He feels more confident getting out of the bed today.  Telemetry reviewed.  Mostly sinus rhythm.  9/24, 3 PM patient had 2.2-second pause.  EKG done today shows normal sinus rhythm.  Objective: Vitals:   03/09/21 1212 03/09/21 1800 03/09/21 2326 03/10/21 0605  BP: 135/86 113/70 130/75 124/85  Pulse: 60 65 (!) 59 63  Resp: 16 16 16 18   Temp: 97.9 F (36.6 C) 98.5 F (36.9 C) 97.9 F (36.6 C) 97.7 F (36.5 C)  TempSrc: Oral Oral Oral Oral  SpO2: 100% 100% 100% 100%    Intake/Output Summary (Last 24 hours) at 03/10/2021 1115 Last data filed at 03/10/2021 0301 Gross per 24 hour  Intake --  Output 300 ml  Net -300 ml   There were no vitals filed for this visit.  Examination:  General exam: Appears calm and comfortable, slightly anxious. Respiratory system: Clear to auscultation. Respiratory effort normal. Cardiovascular system: S1 & S2 heard, RRR. No JVD, murmurs, rubs, gallops or clicks. No pedal edema. Gastrointestinal system: Abdomen is nondistended, soft and nontender. No organomegaly or masses felt. Normal bowel sounds heard. Central nervous system: Alert and oriented. No focal neurological deficits. Extremities: Symmetric 5 x 5 power. Skin: No rashes, lesions or ulcers Psychiatry: Judgement and insight appear normal. Mood & affect appropriate.     Data Reviewed: I have personally reviewed following labs and imaging studies  CBC: Recent Labs  Lab 03/06/21 1321 03/07/21 0420 03/10/21 0439  WBC 9.8 8.4 9.7  NEUTROABS 7.7  --  7.9*  HGB 16.1 15.7 16.7  HCT 46.8 45.3 45.9  MCV 88.5 89.3 84.7  PLT 308 269 185   Basic Metabolic Panel: Recent Labs  Lab 03/06/21 1321 03/07/21 0420 03/10/21 0439  NA 137 134* 135  K 3.9 4.2 3.6  CL 101 101 104  CO2 28 23 19*  GLUCOSE 111* 120* 130*  BUN 11 11 23*  CREATININE 0.93 0.92 1.21  CALCIUM 9.9 9.8 10.0  MG  --   --  2.3  PHOS  --   --  5.0*   GFR: CrCl cannot be calculated (Unknown ideal weight.). Liver Function  Tests: Recent Labs  Lab 03/06/21 1321 03/07/21 0420 03/10/21 0439  AST 17 14*  --   ALT 15 14  --   ALKPHOS 44 40  --   BILITOT 1.0 1.0  --   PROT 8.5* 7.3  --   ALBUMIN 5.1* 4.4 4.5   No results for input(s): LIPASE, AMYLASE in the last 168 hours. No results for input(s): AMMONIA in the last 168 hours. Coagulation Profile: No results for input(s): INR, PROTIME in the last 168 hours. Cardiac Enzymes: No results for input(s): CKTOTAL, CKMB, CKMBINDEX, TROPONINI in the last 168 hours. BNP (last 3 results) No results for input(s): PROBNP in the last 8760 hours. HbA1C: No results for input(s): HGBA1C in the last 72 hours. CBG: No results for input(s): GLUCAP in the last 168 hours. Lipid Profile: No results for input(s): CHOL, HDL, LDLCALC, TRIG, CHOLHDL, LDLDIRECT in the last 72 hours. Thyroid Function Tests: No results for input(s): TSH, T4TOTAL, FREET4, T3FREE, THYROIDAB in the last 72 hours. Anemia Panel: No results for input(s): VITAMINB12, FOLATE, FERRITIN, TIBC, IRON, RETICCTPCT in the last 72 hours. Sepsis Labs: No results for input(s): PROCALCITON, LATICACIDVEN in the last 168 hours.  Recent Results (from the past 240 hour(s))  SARS CORONAVIRUS 2 (TAT 6-24 HRS) Nasopharyngeal Nasopharyngeal Swab     Status: None   Collection Time: 03/06/21  9:56 PM   Specimen: Nasopharyngeal Swab  Result Value Ref Range Status   SARS Coronavirus 2 NEGATIVE NEGATIVE Final    Comment: (NOTE) SARS-CoV-2 target nucleic acids are NOT DETECTED.  The SARS-CoV-2 RNA is generally detectable in upper and lower respiratory specimens during the acute phase of infection. Negative results do not preclude SARS-CoV-2 infection, do not rule out co-infections with other pathogens, and should not be used as the sole basis for treatment or other patient management decisions. Negative results must be combined with clinical observations, patient history, and epidemiological information. The  expected result is Negative.  Fact Sheet for Patients: SugarRoll.be  Fact Sheet for Healthcare Providers: https://www.woods-mathews.com/  This test is not yet approved or cleared by the [place] FDA and  has been authorized for detection and/or diagnosis of SARS-CoV-2 by FDA under an Emergency Use Authorization (EUA). This EUA will remain  in effect (meaning this test can be used) for the duration of the COVID-19 declaration under Se ction 564(b)(1) of the Act, 21 U.S.C. section 360bbb-3(b)(1), unless the authorization is terminated or revoked sooner.  Performed at [facility], [place] [address], [place], [place] [address]          Radiology Studies: CT HEAD WO CONTRAST (5MM)  Result Date: 03/08/2021 CLINICAL DATA:  Hydrocephalus. EXAM: CT HEAD WITHOUT CONTRAST TECHNIQUE: Contiguous axial images were obtained from the base of the skull through the vertex without intravenous contrast. COMPARISON:  Brain MRI 03/06/2021.  Head CT 03/06/2021. FINDINGS: Brain: Grossly unchanged mass centered within the left cerebellar hemisphere.  On the prior brain MRI of 03/06/2021, this mass measured 5.4 x 3.6 x 3.5 cm. Unchanged severe posterior fossa mass effect with effacement of the fourth ventricle. As before, there is mass effect upon the brainstem and inferior cerebellar tonsillar extension below the level of the foramen magnum by 11 mm. Change, there is some degree of upward transtentorial herniation. As before, there is obstructive hydrocephalus with lateral and third ventriculomegaly and diffuse cerebral sulcal effacement. Lateral and third ventriculomegaly is similar to minimally increased as compared to the prior brain MRI of 03/06/2021. For instance, the transverse dimension of the anterior aspect of the third ventricle previously measured 10 mm, and now measures 11 mm. Not significantly changed, there is mild transependymal flow of CSF. No  evidence of acute intracranial hemorrhage. No acute demarcated cortical infarction. No extra-axial collection. No supratentorial midline shift. Vascular: No hyperdense vessel. Skull: Normal. Negative for fracture or focal lesion. Sinuses/Orbits: Visualized orbits show no acute finding. As before, there is near complete opacification of the right frontal sinus, extensive partial opacification of the anterior right ethmoid air cells and complete opacification of the right maxillary sinus (right ostiomeatal unit obstructive pattern. Small right sphenoid sinus mucous retention cyst. Impression #2 will be called to the ordering clinician or representative by the Radiologist Assistant, and communication documented in the PACS or [employer]. IMPRESSION: Grossly unchanged large mass centered within the left cerebellar hemisphere. Similar to the prior brain MRI of 03/06/2021, there is severe posterior fossa mass effect with effacement of the fourth ventricle, mass effect upon the brainstem and inferior displacement of the cerebellar tonsils by 11 mm. There is also some degree of upward transtentorial herniation, also not significantly changed. Persistent obstructive hydrocephalus. Lateral and third ventriculomegaly is similar to slightly increased as compared to the prior MRI. For instance, the transverse dimension of the anterior third ventricle now measures 11 mm (previously 10 mm). Mild transependymal flow of CSF, unchanged. Persistent diffuse cerebral sulcal effacement. Severe right frontal, anterior ethmoid and maxillary sinus disease (right ostiomeatal unit obstructive pattern). Electronically Signed   By: Kellie Simmering D.O.   On: 03/08/2021 20:43        Scheduled Meds:  acetaZOLAMIDE  500 mg Oral BID   dexamethasone (DECADRON) injection  8 mg Intravenous Q8H   Continuous Infusions:   LOS: 4 days    Time spent: 30 minutes    Barb Merino, MD [facility] Pager [phone]

## 2021-03-11 ENCOUNTER — Inpatient Hospital Stay (HOSPITAL_COMMUNITY): Payer: BLUE CROSS/BLUE SHIELD | Admitting: Anesthesiology

## 2021-03-11 ENCOUNTER — Other Ambulatory Visit: Payer: Self-pay | Admitting: Radiation Therapy

## 2021-03-11 ENCOUNTER — Encounter (HOSPITAL_COMMUNITY)
Admission: EM | Disposition: A | Discharge status: Home / Self Care | Payer: Self-pay | Source: Home / Self Care | Attending: Neurological Surgery

## 2021-03-11 ENCOUNTER — Inpatient Hospital Stay (HOSPITAL_COMMUNITY): Payer: BLUE CROSS/BLUE SHIELD

## 2021-03-11 DIAGNOSIS — D496 Neoplasm of unspecified behavior of brain: Secondary | ICD-10-CM | POA: Diagnosis present

## 2021-03-11 DIAGNOSIS — C716 Malignant neoplasm of cerebellum: Secondary | ICD-10-CM | POA: Diagnosis present

## 2021-03-11 HISTORY — PX: APPLICATION OF CRANIAL NAVIGATION: SHX6578

## 2021-03-11 HISTORY — PX: SUBOCCIPITAL CRANIECTOMY CERVICAL LAMINECTOMY: SHX5404

## 2021-03-11 LAB — TYPE AND SCREEN
ABO/RH(D): O POS
Antibody Screen: NEGATIVE

## 2021-03-11 LAB — SURGICAL PCR SCREEN
MRSA, PCR: NEGATIVE
Staphylococcus aureus: POSITIVE — AB

## 2021-03-11 LAB — CREATININE, SERUM
Creatinine, Ser: 1.18 mg/dL (ref 0.61–1.24)
GFR, Estimated: >60 mL/min (ref >60)

## 2021-03-11 LAB — MRSA NEXT GEN BY PCR, NASAL: MRSA by PCR Next Gen: NOT DETECTED

## 2021-03-11 LAB — ABO/RH: ABO/RH(D): O POS

## 2021-03-11 IMAGING — MR MR HEAD WO/W CM
14 of 16 series · 40 of 48 positions shown · IV contrast (gadavist)
Comparison: Brain MRI [DATE]

CLINICAL DATA: Status post tumor resection and EVD placement

EXAM:
MRI HEAD WITHOUT AND WITH CONTRAST
TECHNIQUE: Multiplanar, multiecho pulse sequences of the brain and surrounding
structures were obtained without and with intravenous contrast.
CONTRAST:  9mL GADAVIST GADOBUTROL 1 MMOL/ML IV SOLN

[Series 7: DWI · coronal · 4.0mm · 0.88mm/px · 3 of 72 slices shown (1 of 4)]
[im 1/72]
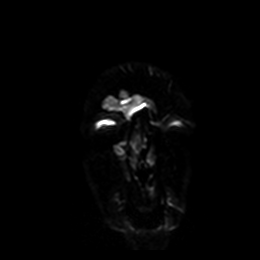
[im 36/72]
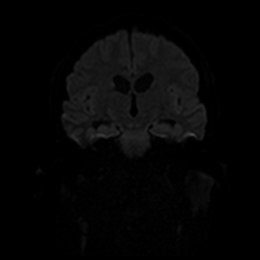
[im 72/72]
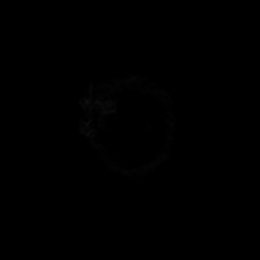

[Series 8: DWI · coronal · 4.0mm · 0.88mm/px · 2 of 36 slices shown (2 of 4)]
[im 1/36]
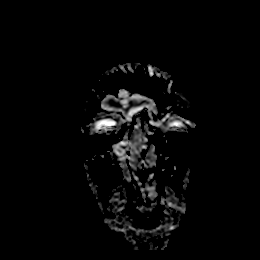
[im 36/36]
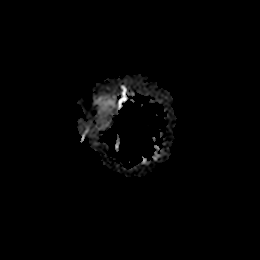

[Series 9: T1 · sagittal · 5.0mm · 0.75mm/px · 1 of 25 slices shown]
[im 1/25]
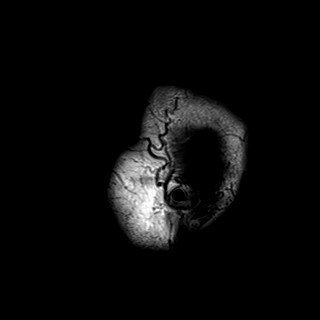

[Series 10: T2 · axial · 5.0mm · 0.72mm/px · z∈[-123,+36]mm · 2 of 28 slices shown]
[im 1/28]
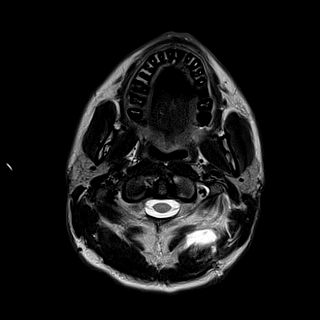
[im 28/28]
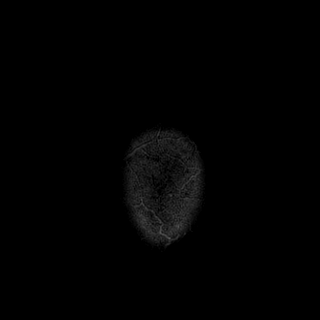

[Series 11: FLAIR · axial · 5.0mm · 0.45mm/px · z∈[-124,+36]mm · 2 of 28 slices shown]
[im 1/28]
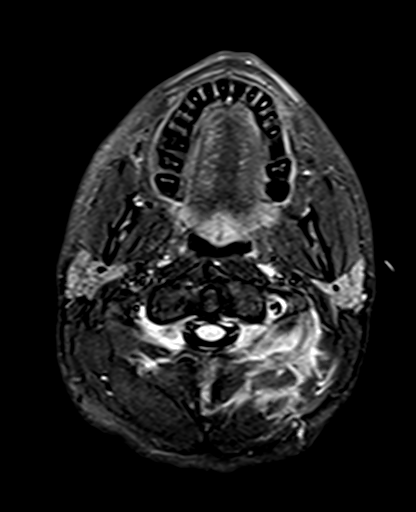
[im 28/28]
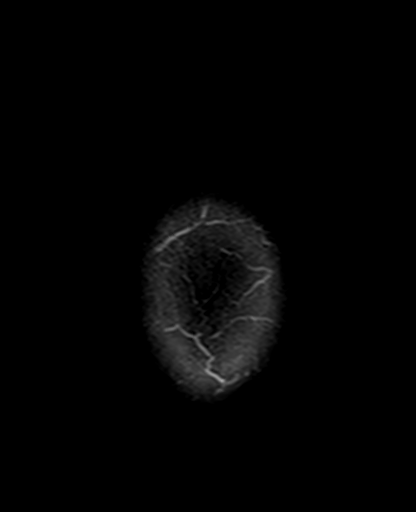

[Series 12: mag_images · axial · 3.0mm · 0.90mm/px · z∈[-131,+43]mm · 4 of 60 slices shown]
[im 1/60]
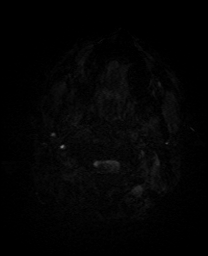
[im 20/60]
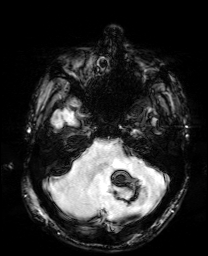
[im 40/60]
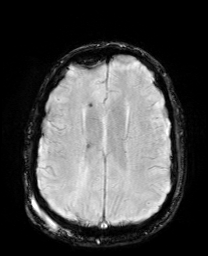
[im 60/60]
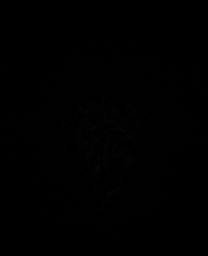

[Series 13: pha_images · axial · 3.0mm · 0.90mm/px · z∈[-131,+40]mm · 4 of 59 slices shown]
[im 1/59]
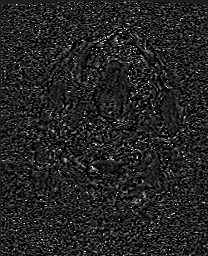
[im 20/59]
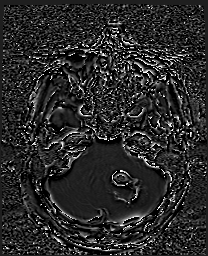
[im 39/59]
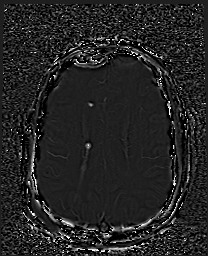
[im 59/59]
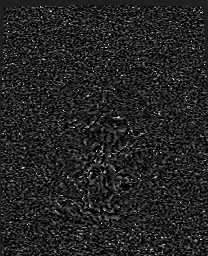

[Series 14: swi_images · axial · 3.0mm · 0.90mm/px · z∈[-131,+43]mm · 4 of 60 slices shown]
[im 1/60]
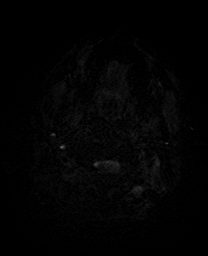
[im 20/60]
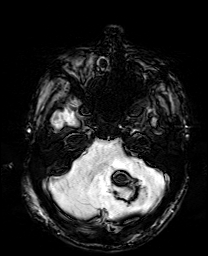
[im 40/60]
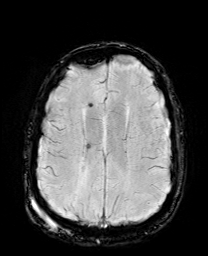
[im 60/60]
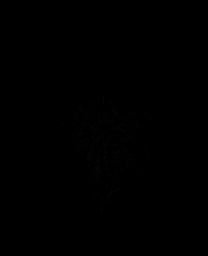

[Series 15: mip_images(sw) · axial · 24.0mm · 0.90mm/px · z∈[-121,+33]mm · 3 of 53 slices shown]
[im 1/53]
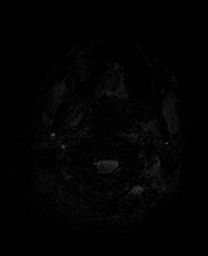
[im 27/53]
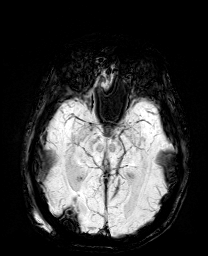
[im 53/53]
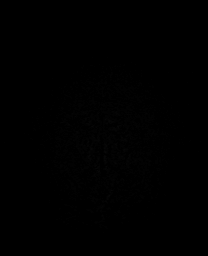

[Series 16: DWI · axial · 3.0mm · 0.88mm/px · z∈[-119,+38]mm · 6 of 108 slices shown (3 of 4)]
[im 1/108]
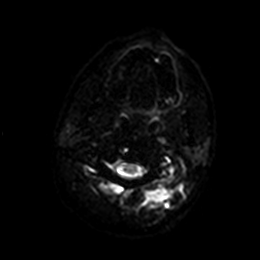
[im 22/108]
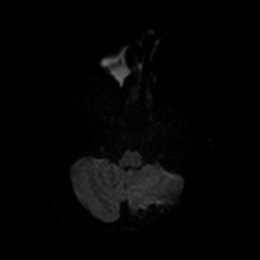
[im 43/108]
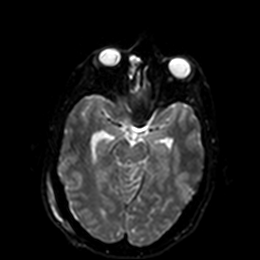
[im 65/108]
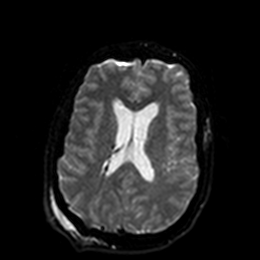
[im 86/108]
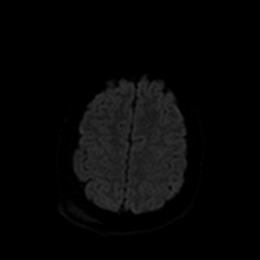
[im 108/108]
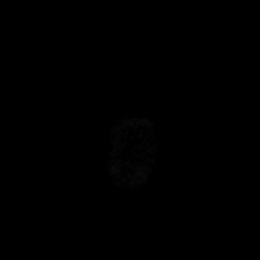

[Series 17: DWI · axial · 3.0mm · 0.88mm/px · z∈[-119,+38]mm · 3 of 54 slices shown (4 of 4)]
[im 1/54]
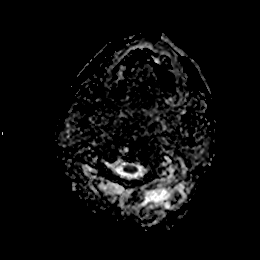
[im 27/54]
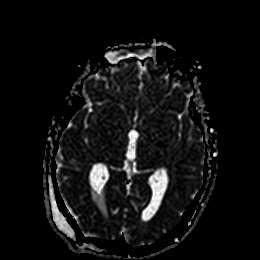
[im 54/54]
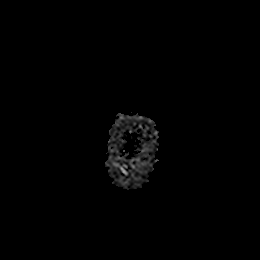

[Series 21: T2 post-contrast · coronal · 5.0mm · 0.72mm/px · 2 of 35 slices shown]
[im 1/35]
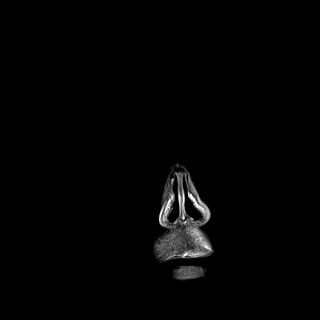
[im 35/35]
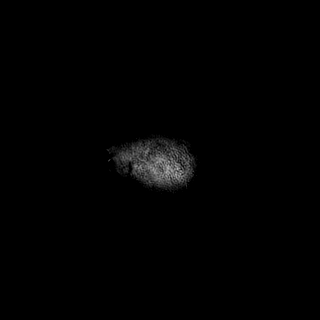

[Series 22: T1 post-contrast · coronal · 5.0mm · 0.34mm/px · 2 of 34 slices shown (1 of 2)]
[im 1/34]
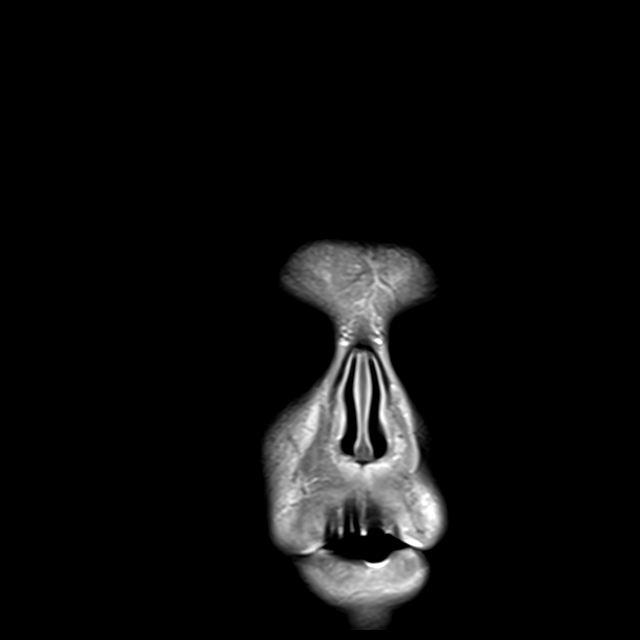
[im 34/34]
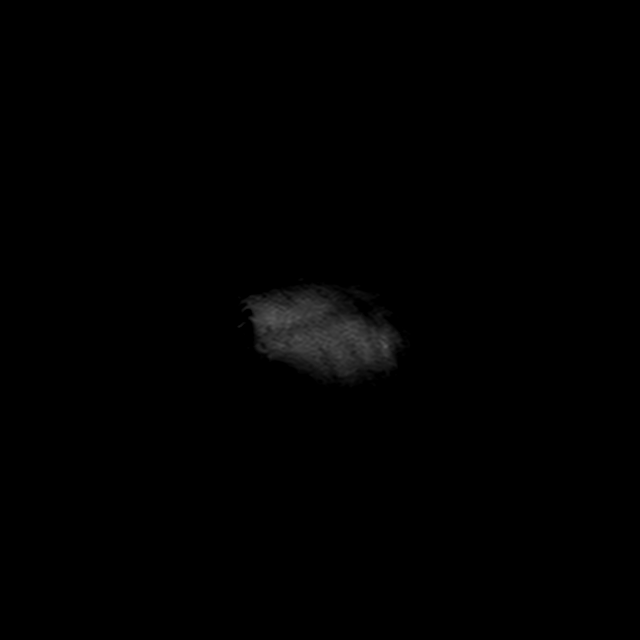

[Series 23: T1 post-contrast · sagittal · 5.0mm · 0.75mm/px · 2 of 30 slices shown (2 of 2)]
[im 1/30]
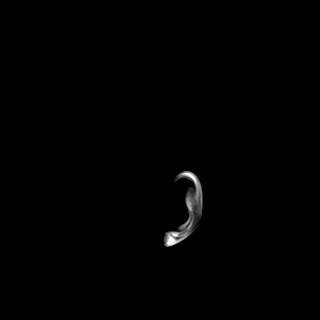
[im 30/30]
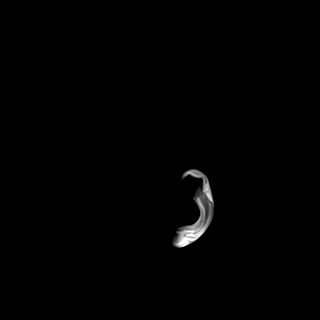

[40 of 48 positions shown; findings below may reference images not displayed]

FINDINGS: Brain: Status post resection of left cerebellar mass. There is
expected postoperative blood at the resection site. Hyperintense
T2-weighted signal within the parenchyma surrounding the resection
cavity. No abnormal contrast enhancement at the resection site;
however, the primary tumor showed little to no contrast enhancement.
There is a posterior right parietal approach extraventricular
drainage catheter with its tip in the right lateral ventricle.
Decreased size of the third and lateral ventricles since the prior
study.

Vascular: Major flow voids are preserved.

Skull and upper cervical spine: Left posterior cranioplasty mesh.

Sinuses/Orbits:Right ostiomeatal complex pattern chronic sinusitis,
unchanged. Normal orbits.
IMPRESSION: 1. Status post resection of left cerebellar mass with expected
postoperative blood at the resection site. This will serve as a
baseline for future studies.
2. Decreased size of the third and lateral ventricles following EVD
placement.

## 2021-03-11 SURGERY — SUBOCCIPITAL CRANIECTOMY CERVICAL LAMINECTOMY/DURAPLASTY
Anesthesia: General | Site: Head

## 2021-03-11 MED ORDER — MANNITOL 25 % IV SOLN
INTRAVENOUS | Status: DC | PRN
  Administered 2021-03-11: 50 g via INTRAVENOUS

## 2021-03-11 MED ORDER — ROCURONIUM BROMIDE 10 MG/ML (PF) SYRINGE
PREFILLED_SYRINGE | INTRAVENOUS | Status: DC | PRN
  Administered 2021-03-11: 20 mg via INTRAVENOUS
  Administered 2021-03-11: 80 mg via INTRAVENOUS
  Administered 2021-03-11: 20 mg via INTRAVENOUS
  Administered 2021-03-11: 40 mg via INTRAVENOUS
  Administered 2021-03-11: 20 mg via INTRAVENOUS
  Administered 2021-03-11: 50 mg via INTRAVENOUS

## 2021-03-11 MED ORDER — MIDAZOLAM HCL 5 MG/5ML IJ SOLN
INTRAMUSCULAR | Status: DC | PRN
  Administered 2021-03-11: 2 mg via INTRAVENOUS

## 2021-03-11 MED ORDER — MUPIROCIN 2 % EX OINT
1.0000 "application " | TOPICAL_OINTMENT | Freq: Two times a day (BID) | CUTANEOUS | Status: DC
  Administered 2021-03-11: 1 via NASAL
  Filled 2021-03-11: qty 22

## 2021-03-11 MED ORDER — LIDOCAINE HCL (PF) 2 % IJ SOLN
INTRAMUSCULAR | Status: AC
  Filled 2021-03-11: qty 5

## 2021-03-11 MED ORDER — ONDANSETRON HCL 4 MG/2ML IJ SOLN
4.0000 mg | INTRAMUSCULAR | Status: DC | PRN
  Administered 2021-03-12 – 2021-03-15 (×3): 4 mg via INTRAVENOUS
  Filled 2021-03-11 (×3): qty 2

## 2021-03-11 MED ORDER — PROPOFOL 500 MG/50ML IV EMUL
INTRAVENOUS | Status: DC | PRN
  Administered 2021-03-11: 25 ug/kg/min via INTRAVENOUS

## 2021-03-11 MED ORDER — HYDROMORPHONE HCL 1 MG/ML IJ SOLN
INTRAMUSCULAR | Status: AC
  Filled 2021-03-11: qty 1

## 2021-03-11 MED ORDER — DOCUSATE SODIUM 100 MG PO CAPS
100.0000 mg | ORAL_CAPSULE | Freq: Two times a day (BID) | ORAL | Status: DC
  Administered 2021-03-12 – 2021-03-16 (×9): 100 mg via ORAL
  Filled 2021-03-11 (×10): qty 1

## 2021-03-11 MED ORDER — PROPOFOL 1000 MG/100ML IV EMUL
INTRAVENOUS | Status: AC
  Filled 2021-03-11: qty 100

## 2021-03-11 MED ORDER — PROMETHAZINE HCL 25 MG PO TABS: 12.5000 mg | ORAL_TABLET | ORAL | Status: DC | PRN

## 2021-03-11 MED ORDER — ONDANSETRON HCL 4 MG/2ML IJ SOLN
INTRAMUSCULAR | Status: AC
  Filled 2021-03-11: qty 2

## 2021-03-11 MED ORDER — ACETAMINOPHEN 650 MG RE SUPP: 650.0000 mg | RECTAL | Status: DC | PRN

## 2021-03-11 MED ORDER — ROCURONIUM BROMIDE 10 MG/ML (PF) SYRINGE
PREFILLED_SYRINGE | INTRAVENOUS | Status: AC
  Filled 2021-03-11: qty 10

## 2021-03-11 MED ORDER — ACETAMINOPHEN 325 MG PO TABS
650.0000 mg | ORAL_TABLET | ORAL | Status: DC | PRN
  Administered 2021-03-11: 650 mg via ORAL
  Filled 2021-03-11: qty 2

## 2021-03-11 MED ORDER — FENTANYL CITRATE (PF) 250 MCG/5ML IJ SOLN
INTRAMUSCULAR | Status: DC | PRN
  Administered 2021-03-11 (×2): 100 ug via INTRAVENOUS
  Administered 2021-03-11: 50 ug via INTRAVENOUS

## 2021-03-11 MED ORDER — MEPERIDINE HCL 25 MG/ML IJ SOLN: 6.2500 mg | INTRAMUSCULAR | Status: DC | PRN

## 2021-03-11 MED ORDER — HYDROMORPHONE HCL 1 MG/ML IJ SOLN
0.2500 mg | INTRAMUSCULAR | Status: DC | PRN
  Administered 2021-03-11: 0.25 mg via INTRAVENOUS

## 2021-03-11 MED ORDER — BACITRACIN ZINC 500 UNIT/GM EX OINT
TOPICAL_OINTMENT | CUTANEOUS | Status: AC
  Filled 2021-03-11: qty 28.35

## 2021-03-11 MED ORDER — ACETAMINOPHEN 10 MG/ML IV SOLN
INTRAVENOUS | Status: AC
  Filled 2021-03-11: qty 100

## 2021-03-11 MED ORDER — DEXMEDETOMIDINE (PRECEDEX) IN NS 20 MCG/5ML (4 MCG/ML) IV SYRINGE
PREFILLED_SYRINGE | INTRAVENOUS | Status: DC | PRN
  Administered 2021-03-11: 8 ug via INTRAVENOUS
  Administered 2021-03-11: 4 ug via INTRAVENOUS
  Administered 2021-03-11: 8 ug via INTRAVENOUS

## 2021-03-11 MED ORDER — BUPIVACAINE HCL (PF) 0.5 % IJ SOLN
INTRAMUSCULAR | Status: AC
  Filled 2021-03-11: qty 30

## 2021-03-11 MED ORDER — 0.9 % SODIUM CHLORIDE (POUR BTL) OPTIME
TOPICAL | Status: DC | PRN
  Administered 2021-03-11 (×3): 1000 mL

## 2021-03-11 MED ORDER — LACTATED RINGERS IV SOLN: INTRAVENOUS | Status: DC | PRN

## 2021-03-11 MED ORDER — GADOBUTROL 1 MMOL/ML IV SOLN
9.0000 mL | Freq: Once | INTRAVENOUS | Status: AC | PRN
  Administered 2021-03-11: 9 mL via INTRAVENOUS

## 2021-03-11 MED ORDER — HYDROMORPHONE HCL 1 MG/ML IJ SOLN
0.5000 mg | INTRAMUSCULAR | Status: DC | PRN
Start: 2021-03-11 — End: 2021-03-16

## 2021-03-11 MED ORDER — BACITRACIN ZINC 500 UNIT/GM EX OINT
TOPICAL_OINTMENT | CUTANEOUS | Status: DC | PRN
  Administered 2021-03-11: 1 via TOPICAL

## 2021-03-11 MED ORDER — MIDAZOLAM HCL 2 MG/2ML IJ SOLN
INTRAMUSCULAR | Status: AC
  Filled 2021-03-11: qty 2

## 2021-03-11 MED ORDER — CEFAZOLIN SODIUM-DEXTROSE 2-3 GM-%(50ML) IV SOLR
INTRAVENOUS | Status: DC | PRN
  Administered 2021-03-11: 2 g via INTRAVENOUS

## 2021-03-11 MED ORDER — ACETAMINOPHEN 10 MG/ML IV SOLN
1000.0000 mg | Freq: Once | INTRAVENOUS | Status: AC
  Administered 2021-03-11: 1000 mg via INTRAVENOUS

## 2021-03-11 MED ORDER — DEXAMETHASONE SODIUM PHOSPHATE 10 MG/ML IJ SOLN
INTRAMUSCULAR | Status: AC
  Filled 2021-03-11: qty 1

## 2021-03-11 MED ORDER — CHLORHEXIDINE GLUCONATE CLOTH 2 % EX PADS
6.0000 | MEDICATED_PAD | Freq: Every day | CUTANEOUS | Status: DC
  Administered 2021-03-12: 6 via TOPICAL

## 2021-03-11 MED ORDER — POLYETHYLENE GLYCOL 3350 17 G PO PACK
17.0000 g | PACK | Freq: Every day | ORAL | Status: DC | PRN
  Administered 2021-03-12 – 2021-03-16 (×4): 17 g via ORAL
  Filled 2021-03-11 (×4): qty 1

## 2021-03-11 MED ORDER — ONDANSETRON HCL 4 MG PO TABS: 4.0000 mg | ORAL_TABLET | ORAL | Status: DC | PRN

## 2021-03-11 MED ORDER — CHLORHEXIDINE GLUCONATE CLOTH 2 % EX PADS: 6.0000 | MEDICATED_PAD | Freq: Every day | CUTANEOUS | Status: DC

## 2021-03-11 MED ORDER — FENTANYL CITRATE (PF) 250 MCG/5ML IJ SOLN
INTRAMUSCULAR | Status: AC
  Filled 2021-03-11: qty 5

## 2021-03-11 MED ORDER — LIDOCAINE-EPINEPHRINE 1 %-1:100000 IJ SOLN
INTRAMUSCULAR | Status: AC
  Filled 2021-03-11: qty 1

## 2021-03-11 MED ORDER — BUPIVACAINE HCL (PF) 0.5 % IJ SOLN
INTRAMUSCULAR | Status: DC | PRN
  Administered 2021-03-11: 5 mL

## 2021-03-11 MED ORDER — FAMOTIDINE 20 MG IN NS 100 ML IVPB
20.0000 mg | Freq: Once | INTRAVENOUS | Status: AC
  Administered 2021-03-11: 20 mg via INTRAVENOUS
  Filled 2021-03-11: qty 100

## 2021-03-11 MED ORDER — SUGAMMADEX SODIUM 200 MG/2ML IV SOLN
INTRAVENOUS | Status: DC | PRN
  Administered 2021-03-11: 200 mg via INTRAVENOUS

## 2021-03-11 MED ORDER — PROPOFOL 10 MG/ML IV BOLUS
INTRAVENOUS | Status: DC | PRN
  Administered 2021-03-11: 200 mg via INTRAVENOUS
  Administered 2021-03-11: 70 mg via INTRAVENOUS

## 2021-03-11 MED ORDER — THROMBIN 5000 UNITS EX SOLR
CUTANEOUS | Status: AC
  Filled 2021-03-11: qty 5000

## 2021-03-11 MED ORDER — CEFAZOLIN SODIUM-DEXTROSE 2-4 GM/100ML-% IV SOLN
2.0000 g | Freq: Three times a day (TID) | INTRAVENOUS | Status: AC
  Administered 2021-03-11 – 2021-03-12 (×2): 2 g via INTRAVENOUS
  Filled 2021-03-11 (×3): qty 100

## 2021-03-11 MED ORDER — PROPOFOL 10 MG/ML IV BOLUS
INTRAVENOUS | Status: AC
  Filled 2021-03-11: qty 40

## 2021-03-11 MED ORDER — ONDANSETRON HCL 4 MG/2ML IJ SOLN
INTRAMUSCULAR | Status: DC | PRN
  Administered 2021-03-11: 4 mg via INTRAVENOUS

## 2021-03-11 MED ORDER — LIDOCAINE 2% (20 MG/ML) 5 ML SYRINGE
INTRAMUSCULAR | Status: DC | PRN
Start: 2021-03-11 — End: 2021-03-11
  Administered 2021-03-11: 60 mg via INTRAVENOUS

## 2021-03-11 MED ORDER — PHENYLEPHRINE HCL-NACL 20-0.9 MG/250ML-% IV SOLN
INTRAVENOUS | Status: DC | PRN
  Administered 2021-03-11: 15 ug/min via INTRAVENOUS

## 2021-03-11 MED ORDER — DEXMEDETOMIDINE (PRECEDEX) IN NS 20 MCG/5ML (4 MCG/ML) IV SYRINGE
PREFILLED_SYRINGE | INTRAVENOUS | Status: AC
  Filled 2021-03-11: qty 5

## 2021-03-11 MED ORDER — LIDOCAINE-EPINEPHRINE 1 %-1:100000 IJ SOLN
INTRAMUSCULAR | Status: DC | PRN
  Administered 2021-03-11: 5 mL

## 2021-03-11 MED ORDER — PHENYLEPHRINE HCL (PRESSORS) 10 MG/ML IV SOLN
INTRAVENOUS | Status: DC | PRN
  Administered 2021-03-11 (×2): 80 ug via INTRAVENOUS

## 2021-03-11 MED ORDER — LABETALOL HCL 5 MG/ML IV SOLN
10.0000 mg | INTRAVENOUS | Status: DC | PRN
  Administered 2021-03-12: 20 mg via INTRAVENOUS
  Filled 2021-03-11: qty 4

## 2021-03-11 MED ORDER — HEPARIN SODIUM (PORCINE) 5000 UNIT/ML IJ SOLN
5000.0000 [IU] | Freq: Three times a day (TID) | INTRAMUSCULAR | Status: DC
  Administered 2021-03-13 – 2021-03-14 (×4): 5000 [IU] via SUBCUTANEOUS
  Filled 2021-03-11 (×4): qty 1

## 2021-03-11 MED ORDER — HYDROCODONE-ACETAMINOPHEN 5-325 MG PO TABS: 1.0000 | ORAL_TABLET | ORAL | Status: DC | PRN

## 2021-03-11 MED ORDER — THROMBIN 5000 UNITS EX SOLR
OROMUCOSAL | Status: DC | PRN
  Administered 2021-03-11: 5 mL via TOPICAL

## 2021-03-11 MED ORDER — PROMETHAZINE HCL 25 MG/ML IJ SOLN: 6.2500 mg | INTRAMUSCULAR | Status: DC | PRN

## 2021-03-11 SURGICAL SUPPLY — 65 items
BAG COUNTER SPONGE SURGICOUNT (BAG) ×2 IMPLANT
BAND RUBBER #18 3X1/16 STRL (MISCELLANEOUS) ×4 IMPLANT
BENZOIN TINCTURE PRP APPL 2/3 (GAUZE/BANDAGES/DRESSINGS) IMPLANT
BLADE CLIPPER SURG (BLADE) ×2 IMPLANT
BLADE ULTRA TIP 2M (BLADE) IMPLANT
BUR ACORN 6.0 PRECISION (BURR) ×2 IMPLANT
BUR PRECISION FLUTE 5.0 (BURR) ×2 IMPLANT
CANISTER SUCT 3000ML PPV (MISCELLANEOUS) ×4 IMPLANT
CATH VENTRIC 35X38 W/TROCAR LG (CATHETERS) ×2 IMPLANT
CLIP VESOCCLUDE MED 6/CT (CLIP) IMPLANT
COVER BACK TABLE 60X90IN (DRAPES) IMPLANT
DERMABOND ADVANCED (GAUZE/BANDAGES/DRESSINGS) ×1
DERMABOND ADVANCED .7 DNX12 (GAUZE/BANDAGES/DRESSINGS) ×1 IMPLANT
DRAPE LAPAROTOMY 100X72 PEDS (DRAPES) ×2 IMPLANT
DRAPE MICROSCOPE LEICA (MISCELLANEOUS) ×2 IMPLANT
DRAPE WARM FLUID 44X44 (DRAPES) ×2 IMPLANT
DURAPREP 6ML APPLICATOR 50/CS (WOUND CARE) ×2 IMPLANT
ELECT REM PT RETURN 9FT ADLT (ELECTROSURGICAL) ×2
ELECTRODE REM PT RTRN 9FT ADLT (ELECTROSURGICAL) ×1 IMPLANT
EVACUATOR 1/8 PVC DRAIN (DRAIN) IMPLANT
EVACUATOR SILICONE 100CC (DRAIN) IMPLANT
FORCEPS BIPOLAR SPETZLER 8 1.0 (NEUROSURGERY SUPPLIES) ×2 IMPLANT
GAUZE 4X4 16PLY ~~LOC~~+RFID DBL (SPONGE) ×2 IMPLANT
GAUZE SPONGE 4X4 12PLY STRL (GAUZE/BANDAGES/DRESSINGS) IMPLANT
GLOVE SURG LTX SZ7.5 (GLOVE) ×6 IMPLANT
GLOVE SURG UNDER POLY LF SZ7 (GLOVE) ×8 IMPLANT
GLOVE SURG UNDER POLY LF SZ7.5 (GLOVE) ×6 IMPLANT
GOWN STRL REUS W/ TWL LRG LVL3 (GOWN DISPOSABLE) ×2 IMPLANT
GOWN STRL REUS W/ TWL XL LVL3 (GOWN DISPOSABLE) ×1 IMPLANT
GOWN STRL REUS W/TWL 2XL LVL3 (GOWN DISPOSABLE) ×2 IMPLANT
GOWN STRL REUS W/TWL LRG LVL3 (GOWN DISPOSABLE) ×2
GOWN STRL REUS W/TWL XL LVL3 (GOWN DISPOSABLE) ×1
HEMOSTAT POWDER KIT SURGIFOAM (HEMOSTASIS) ×2 IMPLANT
HEMOSTAT SURGICEL 2X14 (HEMOSTASIS) IMPLANT
KIT BASIN OR (CUSTOM PROCEDURE TRAY) ×2 IMPLANT
KIT DRAIN CSF ACCUDRAIN (MISCELLANEOUS) ×2 IMPLANT
KIT TURNOVER KIT B (KITS) ×2 IMPLANT
MARKER SPHERE PSV REFLC 13MM (MARKER) ×6 IMPLANT
NEEDLE HYPO 25X1 1.5 SAFETY (NEEDLE) ×2 IMPLANT
NS IRRIG 1000ML POUR BTL (IV SOLUTION) ×6 IMPLANT
PACK BATTERY CMF DISP FOR DVR (ORTHOPEDIC DISPOSABLE SUPPLIES) ×2 IMPLANT
PACK CRANIOTOMY CUSTOM (CUSTOM PROCEDURE TRAY) ×2 IMPLANT
PAD ARMBOARD 7.5X6 YLW CONV (MISCELLANEOUS) ×6 IMPLANT
PATTIES SURGICAL .5 X.5 (GAUZE/BANDAGES/DRESSINGS) ×2 IMPLANT
PATTIES SURGICAL .5 X1 (DISPOSABLE) IMPLANT
PATTIES SURGICAL 1/4 X 3 (GAUZE/BANDAGES/DRESSINGS) IMPLANT
PLATE MALL UNIV 0.3 (Plate) ×2 IMPLANT
SCREW UNIII AXS SD 1.5X4 (Screw) ×10 IMPLANT
SPONGE SURGIFOAM ABS GEL 100 (HEMOSTASIS) IMPLANT
SPONGE T-LAP 4X18 ~~LOC~~+RFID (SPONGE) ×4 IMPLANT
STAPLER SKIN PROX WIDE 3.9 (STAPLE) ×2 IMPLANT
SUT ETHILON 3 0 FSL (SUTURE) IMPLANT
SUT MNCRL AB 3-0 PS2 27 (SUTURE) ×2 IMPLANT
SUT MON AB 3-0 SH 27 (SUTURE)
SUT MON AB 3-0 SH27 (SUTURE) IMPLANT
SUT NURALON 4 0 TF (SUTURE) ×4 IMPLANT
SUT PROLENE 6 0 BV (SUTURE) IMPLANT
SUT VIC AB 0 CT1 18XCR BRD8 (SUTURE) ×1 IMPLANT
SUT VIC AB 0 CT1 8-18 (SUTURE) ×1
SUT VIC AB 2-0 CP2 18 (SUTURE) ×2 IMPLANT
TOWEL GREEN STERILE (TOWEL DISPOSABLE) ×2 IMPLANT
TOWEL GREEN STERILE FF (TOWEL DISPOSABLE) ×2 IMPLANT
TRAY FOLEY MTR SLVR 16FR STAT (SET/KITS/TRAYS/PACK) ×2 IMPLANT
UNDERPAD 30X36 HEAVY ABSORB (UNDERPADS AND DIAPERS) IMPLANT
WATER STERILE IRR 1000ML POUR (IV SOLUTION) ×2 IMPLANT

## 2021-03-11 NOTE — Progress Notes (Signed)
Patient went to operating room early morning hours and then will be transferred to intensive care unit, with neurosurgery today.  No active medical issues.  Not able to examine today.  Plan: Patient transferred to neurosurgical care under care of Dr. Zada Finders.  No face to face encounter . No charge.

## 2021-03-11 NOTE — Anesthesia Procedure Notes (Signed)
Procedure Name: Intubation Date/Time: 03/11/2021 8:00 AM Performed by: Myna Bright, CRNA Pre-anesthesia Checklist: Patient identified, Emergency Drugs available, Suction available and Patient being monitored Patient Re-evaluated:Patient Re-evaluated prior to induction Oxygen Delivery Method: Circle system utilized Preoxygenation: Pre-oxygenation with 100% oxygen Induction Type: IV induction Ventilation: Mask ventilation without difficulty Laryngoscope Size: Mac and 4 Grade View: Grade I Tube type: Oral Tube size: 7.5 mm Number of attempts: 1 Airway Equipment and Method: Stylet Placement Confirmation: ETT inserted through vocal cords under direct vision, positive ETCO2 and breath sounds checked- equal and bilateral Secured at: 22 cm Tube secured with: Tape Dental Injury: Teeth and Oropharynx as per pre-operative assessment  Comments: Atraumatic placement

## 2021-03-11 NOTE — Transfer of Care (Signed)
Immediate Anesthesia Transfer of Care Note  Patient: Scientist, forensic  Procedure(s) Performed: SUBOCCIPITAL CRANIECTOMY FOR TUMOR WITH VENTRICULAR DRAIN (Head) APPLICATION OF CRANIAL NAVIGATION (Head)  Patient Location: PACU  Anesthesia Type:General  Level of Consciousness: awake, alert , oriented and patient cooperative  Airway & Oxygen Therapy: Patient Spontanous Breathing and Patient connected to face mask oxygen  Post-op Assessment: Report given to RN, Post -op Vital signs reviewed and stable and Patient moving all extremities X 4  Post vital signs: Reviewed and stable  Last Vitals:  Vitals Value Taken Time  BP 140/94 03/11/21 1220  Temp    Pulse 81 03/11/21 1220  Resp    SpO2 100 % 03/11/21 1220  Vitals shown include unvalidated device data.  Last Pain:  Vitals:   03/11/21 0516  TempSrc: Oral  PainSc:       Patients Stated Pain Goal: 2 (70/96/28 3662)  Complications: No notable events documented.

## 2021-03-11 NOTE — Progress Notes (Signed)
Neurosurgery Service Progress Note  Subjective: No acute events overnight, continued headaches, no N/V   Objective: Vitals:   03/10/21 1218 03/10/21 1833 03/10/21 2251 03/11/21 0516  BP: (!) 142/93 (!) 141/70 (!) 145/87 135/89  Pulse: 65 62 70 74  Resp: 18 18 18 18   Temp: 97.7 F (36.5 C) 98.5 F (36.9 C) 98.9 F (37.2 C) 98.6 F (37 C)  TempSrc: Oral Oral Oral Oral  SpO2: 98% 98% 98% 98%    Physical Exam: AOx3, PERRL, EOMI, FS, TM, Strength 5/5 x4, SILTx4, +appendicular ataxia  Assessment & Plan: 25 y.o. 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  -d/c diamox and wean dex post-op -OR today for crani for tumor resection and EVD placement, 4N ICU post-op, will transfer to my service  Judith Part  03/11/21 7:40 AM

## 2021-03-11 NOTE — Progress Notes (Signed)
Neurosurgery Service Post-operative progress note  Assessment & Plan: 25 y.o. man s/p crani for tumor and EVD placement, seen in PACU, awake/alert, Fcx4 with fluent speech.  -EVD at +10cm, may not drain for up to 24h due to intra-op CSF losses -admit to 4N ICU -MRI brain w/wo contrast when able to assess for resection  Judith Part  03/11/21 12:45 PM

## 2021-03-11 NOTE — Op Note (Signed)
PATIENT: Scott Snyder  DAY OF SURGERY: 03/11/21   PRE-OPERATIVE DIAGNOSIS:  Cerebellar brain tumor, hydrocephalus   POST-OPERATIVE DIAGNOSIS:  Same   PROCEDURE:  Suboccipital craniotomy for tumor resection, placement of external ventricular drain   SURGEON:  Surgeon(s) and Role:    Judith Part, MD - Primary   ANESTHESIA: ETGA   BRIEF HISTORY: This is a 25 year old man who presented with headaches and ataxia. The patient was found to have a large left cerebellar tumor with ventriculomegaly and clinical symptoms consistent with hydrocephalus. I therefore recommended a craniotomy for tumor resection with intra-op placement of EVD with stereotactic guidance. This was discussed with the patient as well as risks, benefits, and alternatives and wished to proceed with surgery.   OPERATIVE DETAIL: The patient was taken to the operating room, anesthesia was induced by the anesthesia team, the [place] head holder was applied and the patient was placed on the OR table in the prone position. A formal time out was performed with two patient identifiers and confirmed the operative site. A registration array was attached to the [place]. This was co-registered with the patient's preoperative imaging, the fit appeared to be acceptable. Using frameless stereotaxy, the operative trajectory was planned and the incision was marked. Hair was clipped with surgical clippers over the incision and the area was then prepped and draped in a sterile fashion.  The EVD was placed first. Using stereotactic guidance, a small incision was placed in the right parieto-occipital region, a burr hole was created, dura coagulated and opened, and stereotactic guidance was used to place a ventricular catheter. CSF was drained and this was secured, attention was then turned to the tumor resection.  A left paramedian incision was created in the suboccipital region. Soft tissues were dissected and retracted, a craniectomy was  created using a high speed drill and rongeurs, and the dura was opened sharply. Landmarks and stereotaxy was checked and a corticotomy was created to access the tumor. It was dissected circumferentially and resected then sent to pathology. The margins were fairly diffuse without a clear plane, somewhat helped by frequent checks with stereotaxy. Resection margins were checked with the operating microscope and stereotaxy, hemostasis was obtained and confirmed, and the wound was copiously irrigated.   All instrument and sponge counts were correct, the incision was then closed in layers. The patient was then returned to anesthesia for emergence. No apparent complications at the completion of the procedure.   EBL:  273mL   DRAINS: External ventricular drain   SPECIMENS: Cerebellar tumor   Judith Part, MD 03/11/21 7:41 AM

## 2021-03-11 NOTE — Anesthesia Procedure Notes (Signed)
Arterial Line Insertion Start/End9/26/2022 7:30 AM, 03/11/2021 7:45 AM Performed by: Nolon Nations, MD, anesthesiologist  Patient location: Pre-op. Preanesthetic checklist: patient identified, IV checked, site marked, risks and benefits discussed, surgical consent, monitors and equipment checked, pre-op evaluation, timeout performed and anesthesia consent Lidocaine 1% used for infiltration Right, radial was placed Catheter size: 20 G Hand hygiene performed  and maximum sterile barriers used   Attempts: 1 Procedure performed using ultrasound guided technique. Ultrasound Notes:anatomy identified, needle tip was noted to be adjacent to the nerve/plexus identified, no ultrasound evidence of intravascular and/or intraneural injection and image(s) printed for medical record Following insertion, dressing applied and Biopatch. Post procedure assessment: normal and unchanged  Post procedure complications: second provider assisted and unsuccessful attempts. Patient tolerated the procedure well with no immediate complications.

## 2021-03-12 ENCOUNTER — Encounter (HOSPITAL_COMMUNITY): Payer: Self-pay | Admitting: Neurological Surgery

## 2021-03-12 ENCOUNTER — Other Ambulatory Visit: Payer: Self-pay | Admitting: Family Medicine

## 2021-03-12 LAB — POCT I-STAT 7, (LYTES, BLD GAS, ICA,H+H)
Acid-base deficit: 6 mmol/L — ABNORMAL HIGH (ref 0.0–2.0)
Bicarbonate: 18.4 mmol/L — ABNORMAL LOW (ref 20.0–28.0)
Calcium, Ion: 1.28 mmol/L (ref 1.15–1.40)
HCT: 43 % (ref 39.0–52.0)
Hemoglobin: 14.6 g/dL (ref 13.0–17.0)
O2 Saturation: 100 %
Patient temperature: 35.2
Potassium: 4 mmol/L (ref 3.5–5.1)
Sodium: 137 mmol/L (ref 135–145)
TCO2: 19 mmol/L — ABNORMAL LOW (ref 22–32)
pCO2 arterial: 29.2 mmHg — ABNORMAL LOW (ref 32.0–48.0)
pH, Arterial: 7.399 (ref 7.350–7.450)
pO2, Arterial: 297 mmHg — ABNORMAL HIGH (ref 83.0–108.0)

## 2021-03-12 MED ORDER — MUPIROCIN 2 % EX OINT
1.0000 "application " | TOPICAL_OINTMENT | Freq: Two times a day (BID) | CUTANEOUS | Status: DC
  Administered 2021-03-12 – 2021-03-16 (×9): 1 via NASAL

## 2021-03-12 MED ORDER — METHOCARBAMOL 500 MG PO TABS: 500.0000 mg | ORAL_TABLET | Freq: Four times a day (QID) | ORAL | Status: DC | PRN

## 2021-03-12 MED ORDER — CHLORHEXIDINE GLUCONATE CLOTH 2 % EX PADS
6.0000 | MEDICATED_PAD | Freq: Every day | CUTANEOUS | Status: DC
  Administered 2021-03-12 – 2021-03-15 (×4): 6 via TOPICAL

## 2021-03-12 NOTE — Progress Notes (Signed)
Neurosurgery Service Progress Note  Subjective: No acute events overnight, headaches resolved from preop, but does have post-op neck pain as expected  Objective: Vitals:   03/12/21 1100 03/12/21 1200 03/12/21 1300 03/12/21 1500  BP: (!) 149/98 (!) 149/95 (!) 153/95 (!) 144/99  Pulse: 62 (!) 59 (!) 58 65  Resp: 17 17 16 17   Temp:  98.4 F (36.9 C)    TempSrc:  Oral    SpO2: 97% 97% 98% 98%  Weight:      Height:        Physical Exam: AOx3, PERRL, EOMI, FS, TM, Strength 5/5 x4, SILTx4, +appendicular ataxia on L  Assessment & Plan: 25 y.o. 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 9/26 s/p L craniotomy for tumor resection, 9/27 post-op MRI w/ likely residual tumor  -sent CSF for cytology today, will see if this or prelim path can guide whether or not repeat resection is needed. -continue EVD at +10, will clamp POD3  Judith Part  03/12/21 4:42 PM

## 2021-03-12 NOTE — Evaluation (Signed)
Occupational Therapy Evaluation Patient Details Name: Scott Snyder MRN: [id number] DOB: 12/06/1995 Today's Date: 03/12/2021   History of Present Illness 25yo male who presented to ED with HA associated with n/v and recent development of incoordination and feeling very off balance. 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 Pt underwent crani for resection of L cerebellar tumor with EVD placement on 9/26. PMH: unremarkable   Clinical Impression   Patient is s/p L cerebellar tumor resection surgery resulting in functional limitations due to the deficits listed below (see OT problem list). Pt currently progressed with Ot to bathroom level adls with min (A) for balance. Pt demonstrates need to ankle strategies with bil UE use.  Patient will benefit from skilled OT acutely to increase independence and safety with ADLS to allow discharge outpatient balance.       Recommendations for follow up therapy are one component of a multi-disciplinary discharge planning process, led by the attending physician.  Recommendations may be updated based on patient status, additional functional criteria and insurance authorization.   Follow Up Recommendations  Outpatient OT (BALANCE)    Equipment Recommendations  None recommended by OT    Recommendations for Other Services       Precautions / Restrictions Precautions Precautions: Other (comment) Precaution Comments: EVD, clamped prior to mobility      Mobility Bed Mobility Overal bed mobility: Modified Independent                  Transfers Overall transfer level: Needs assistance   Transfers: Sit to/from Stand Sit to Stand: Min assist         General transfer comment: (A) for steady    Balance Overall balance assessment: Needs assistance   Sitting balance-Leahy Scale: Good       Standing balance-Leahy Scale: Fair                             ADL either performed or assessed with clinical judgement   ADL  Overall ADL's : Needs assistance/impaired   Eating/Feeding Details (indicate cue type and reason): reports feeling like he has heart burn from eating Grooming: Oral care;Applying deodorant;Minimal assistance;Min guard;Standing Grooming Details (indicate cue type and reason): pt with sway to static standing and requires UB support or OT support of steady during task Upper Body Bathing: Min guard   Lower Body Bathing: Min guard   Upper Body Dressing : Min guard   Lower Body Dressing: Min guard   Toilet Transfer: Magazine features editor Details (indicate cue type and reason): standing to void Toileting- Water quality scientist and Hygiene: Min guard       Functional mobility during ADLs: Minimal assistance       Vision Baseline Vision/History: 0 No visual deficits Vision Assessment?: No apparent visual deficits     Perception     Praxis      Pertinent Vitals/Pain Pain Assessment: 0-10 Pain Score: 1  Pain Location: back of head Pain Descriptors / Indicators: Discomfort Pain Intervention(s): Limited activity within patient's tolerance     Hand Dominance Right   Extremity/Trunk Assessment Upper Extremity Assessment Upper Extremity Assessment: Overall WFL for tasks assessed   Lower Extremity Assessment Lower Extremity Assessment: Defer to PT evaluation   Cervical / Trunk Assessment Cervical / Trunk Assessment: Normal   Communication Communication Communication: No difficulties   Cognition Arousal/Alertness: Awake/alert Behavior During Therapy: WFL for tasks assessed/performed Overall Cognitive Status: Within Functional Limits for tasks assessed  General Comments  EVD clamped and RN notified to unclamp at end of session. mother and MD in room    Exercises Exercises: Other exercises Other Exercises Other Exercises: cervical rotation toward the R and neck flexion . education on movement of neck to tolerance    Shoulder Instructions      Home Living Family/patient expects to be discharged to:: Private residence Living Arrangements: Spouse/significant other Available Help at Discharge: Family;Available 24 hours/day Type of Home: House Home Access: Stairs to enter [employer] of Steps: 3 Entrance Stairs-Rails: Right;Left Home Layout: One level     Bathroom Shower/Tub: Teacher, early years/pre: Standard     Home Equipment: None   Additional Comments: girlfriend and mother present during eval      Prior Functioning/Environment Level of Independence: Independent        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        OT Problem List: Impaired balance (sitting and/or standing);Decreased coordination;Decreased cognition;Decreased safety awareness      OT Treatment/Interventions: Self-care/ADL training;Therapeutic exercise;Neuromuscular education;Energy conservation;DME and/or AE instruction;Therapeutic activities;Cognitive remediation/compensation;Patient/family education;Balance training    OT Goals(Current goals can be found in the care plan section) Acute Rehab OT Goals Patient Stated Goal: to get teeth brushed OT Goal Formulation: With patient/family Time For Goal Achievement: 03/26/21 Potential to Achieve Goals: Good  OT Frequency: Min 2X/week   Barriers to D/C:            Co-evaluation              AM-PAC OT "6 Clicks" Daily Activity     Outcome Measure Help from another person eating meals?: None Help from another person taking care of personal grooming?: None Help from another person toileting, which includes using toliet, bedpan, or urinal?: A Little Help from another person bathing (including washing, rinsing, drying)?: A Little Help from another person to put on and taking off regular upper body clothing?: None Help from another person to put on and taking off regular lower body clothing?: A Little 6 Click Score: 21   End of Session Nurse  Communication: Mobility status;Precautions  Activity Tolerance: Patient tolerated treatment well Patient left: in bed;with call bell/phone within reach;with family/visitor present;with bed alarm set  OT Visit Diagnosis: Unsteadiness on feet (R26.81)                Time: 1350-1406 OT Time Calculation (min): 16 min Charges:  OT General Charges $OT Visit: 1 Visit OT Evaluation $OT Eval Moderate Complexity: 1 Mod   Brynn, OTR/L  Acute Rehabilitation Services Pager: [phone] Office: [phone] .   Jeri Modena 03/12/2021, 2:23 PM

## 2021-03-12 NOTE — Progress Notes (Signed)
Physical Therapy RE-evaluation Patient Details Name: Osaze Hubbert MRN: [id number] DOB: 26-May-1996 Today's Date: 03/12/2021   History of Present Illness Pt is a 25yo male who presented to ED with HA associated with n/v and recent development of incoordination and feeling very off balance. 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 PMH: unremarkable    PT Comments    Pt underwent surgery yesterday and is in excellent spirits and feels much better than initial eval. Pt denied dizziness or nausea with ambulation. Pt with noted impaired balance but tolerated ambulation well with unilateral UE support. Suspect pt to progress well functionally. Acute PT to cont to follow.   Recommendations for follow up therapy are one component of a multi-disciplinary discharge planning process, led by the attending physician.  Recommendations may be updated based on patient status, additional functional criteria and insurance authorization.  Follow Up Recommendations  Outpatient PT;Supervision/Assistance - 24 hour     Equipment Recommendations  None recommended by PT    Recommendations for Other Services       Precautions / Restrictions Precautions Precautions: Other (comment) Precaution Comments: EVD, clamped prior to mobility Restrictions Weight Bearing Restrictions: No     Mobility  Bed Mobility Overal bed mobility: Needs Assistance Bed Mobility: Rolling;Sidelying to Sit Rolling: Min guard Sidelying to sit: Min guard       General bed mobility comments: increased time, verbal cues for hand placement, instructed on log roll to minimize strain on neck, assist for line management    Transfers Overall transfer level: Needs assistance Equipment used: 1 person hand held assist Transfers: Sit to/from Stand Sit to Stand: Min assist         General transfer comment: minA to steady due to first time up, wide base of support, didn't use UEs  Ambulation/Gait Ambulation/Gait assistance:  Min assist Gait Distance (Feet): 150 Feet Assistive device: IV Pole Gait Pattern/deviations: Step-through pattern;Decreased stride length Gait velocity: dec Gait velocity interpretation: <1.31 ft/sec, indicative of household ambulator General Gait Details: pt with cervical flexion due to difficulty holding head up due to fatigue/soreness from surgery, pt wtih shorter step length compared to normal, pt attempted to increase step length but loss of balance x 3   Stairs             Wheelchair Mobility    Modified Rankin (Stroke Patients Only)       Balance Overall balance assessment: Needs assistance Sitting-balance support: Feet supported;No upper extremity supported Sitting balance-Leahy Scale: Good Sitting balance - Comments: pt able to don socks without LOB or assist   Standing balance support: Single extremity supported;During functional activity Standing balance-Leahy Scale: Fair Standing balance comment: pt requiring unilateral UE support for stability                            Cognition Arousal/Alertness: Awake/alert Behavior During Therapy: WFL for tasks assessed/performed Overall Cognitive Status: Within Functional Limits for tasks assessed                                 General Comments: pt smiling and in good spirits      Exercises      General Comments General comments (skin integrity, edema, etc.): EVD intact, denies dizziness and nausea, no diaphoresis      Pertinent Vitals/Pain Pain Assessment: 0-10 Pain Score: 1  Pain Location: back of head Pain Descriptors /  Indicators: Discomfort (stiffness) Pain Intervention(s): Monitored during session    Home Living                      Prior Function            PT Goals (current goals can now be found in the care plan section) Progress towards PT goals: Progressing toward goals    Frequency    Min 4X/week      PT Plan Discharge plan needs to be updated     Co-evaluation              AM-PAC PT "6 Clicks" Mobility   Outcome Measure  Help needed turning from your back to your side while in a flat bed without using bedrails?: A Little Help needed moving from lying on your back to sitting on the side of a flat bed without using bedrails?: A Little Help needed moving to and from a bed to a chair (including a wheelchair)?: A Little Help needed standing up from a chair using your arms (e.g., wheelchair or bedside chair)?: A Little Help needed to walk in hospital room?: A Little Help needed climbing 3-5 steps with a railing? : A Little 6 Click Score: 18    End of Session Equipment Utilized During Treatment: Gait belt Activity Tolerance: Patient tolerated treatment well Patient left: in chair;with call bell/phone within reach;with family/visitor present (grandfather)   PT Visit Diagnosis: Unsteadiness on feet (R26.81);Muscle weakness (generalized) (M62.81);Difficulty in walking, not elsewhere classified (R26.2)     Time: 2876-8115 PT Time Calculation (min) (ACUTE ONLY): 35 min  Charges:  $Gait Training: 8-22 mins                     Kittie Plater, PT, DPT Acute Rehabilitation Services Pager #: [phone] Office #: [phone]    Berline Lopes 03/12/2021, 9:25 AM

## 2021-03-12 NOTE — Anesthesia Postprocedure Evaluation (Signed)
Anesthesia Post Note  Patient: Scientist, forensic  Procedure(s) Performed: SUBOCCIPITAL CRANIECTOMY FOR TUMOR WITH VENTRICULAR DRAIN (Head) APPLICATION OF CRANIAL NAVIGATION (Head)     Patient location during evaluation: PACU Anesthesia Type: General Level of consciousness: sedated and patient cooperative Pain management: pain level controlled Vital Signs Assessment: post-procedure vital signs reviewed and stable Respiratory status: spontaneous breathing Cardiovascular status: stable Anesthetic complications: no   No notable events documented.  Last Vitals:  Vitals:   03/12/21 0600 03/12/21 0800  BP: 137/88   Pulse: (!) 59   Resp: 14   Temp:  36.7 C  SpO2: 91%     Last Pain:  Vitals:   03/12/21 0800  TempSrc: Oral  PainSc: 0-No pain                 Nolon Nations

## 2021-03-12 NOTE — Telephone Encounter (Signed)
Pt was prescribed med at [facility]- pt no longer under prescriber's care

## 2021-03-13 LAB — CYTOLOGY - NON PAP

## 2021-03-13 MED ORDER — DEXAMETHASONE 4 MG PO TABS
2.0000 mg | ORAL_TABLET | Freq: Two times a day (BID) | ORAL | Status: DC
  Administered 2021-03-13 – 2021-03-16 (×7): 2 mg via ORAL
  Filled 2021-03-13 (×7): qty 1

## 2021-03-13 NOTE — Progress Notes (Signed)
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 9/26 s/p L craniotomy for tumor resection, 9/27 post-op MRI w/ likely residual tumor  -continue EVD at +10, will clamp 9/29 -CSF cytology and path pending -activity as tolerated, can be up and OOB w/ drain clamped -SCDs/TEDs/SQH  Aubrey Blackard A Timmie Dugue  03/13/21 9:08 AM

## 2021-03-13 NOTE — Progress Notes (Signed)
Physical Therapy Treatment Patient Details Name: Scott Snyder MRN: [id number] DOB: 02-Apr-1996 Today's Date: 03/13/2021   History of Present Illness 25yo male who presented to ED with HA associated with n/v and recent development of incoordination and feeling very off balance. 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 Pt underwent crani for resection of L cerebellar tumor with EVD placement on 9/26. PMH: unremarkable    PT Comments    Patient progressing well towards physical therapy goals. Patient ambulated 300' with min guard and no AD. Performed reaching tasks and mini squats on geomat cushion. Patient continues to improve each day. D/c plan remains appropriate. Patient motivated to return to [facility] and regain independence.     Recommendations for follow up therapy are one component of a multi-disciplinary discharge planning process, led by the attending physician.  Recommendations may be updated based on patient status, additional functional criteria and insurance authorization.  Follow Up Recommendations  Outpatient PT;Supervision/Assistance - 24 hour     Equipment Recommendations  None recommended by PT    Recommendations for Other Services       Precautions / Restrictions Precautions Precautions: Other (comment) Precaution Comments: EVD, clamped prior to mobility Restrictions Weight Bearing Restrictions: No     Mobility  Bed Mobility               General bed mobility comments: in chair on arrival    Transfers Overall transfer level: Needs assistance Equipment used: None Transfers: Sit to/from Stand Sit to Stand: Min guard         General transfer comment: min guard for safety  Ambulation/Gait Ambulation/Gait assistance: Min guard Gait Distance (Feet): 300 Feet Assistive device: None Gait Pattern/deviations: Step-through pattern;Decreased stride length;Scissoring;Drifts right/left;Narrow base of support Gait velocity: improved from previous  session   General Gait Details: scissoring at times but able to self correct. Use of vision to assist with proprioceptive deficits. Min guard for safety.   Stairs             Wheelchair Mobility    Modified Rankin (Stroke Patients Only)       Balance Overall balance assessment: Needs assistance Sitting-balance support: No upper extremity supported;Feet supported Sitting balance-Leahy Scale: Normal     Standing balance support: No upper extremity supported;During functional activity Standing balance-Leahy Scale: Fair               High level balance activites: Side stepping;Backward walking;Direction changes;Turns (min to min guard (A))              Cognition Arousal/Alertness: Awake/alert Behavior During Therapy: WFL for tasks assessed/performed Overall Cognitive Status: Within Functional Limits for tasks assessed                                        Exercises Other Exercises Other Exercises: On geomat cushion, reaching outside of BOS up/down/L/R and mini squats.    General Comments General comments (skin integrity, edema, etc.): VSS EVD clamped RN informed to unclamp at end of session HR 126 max during session      Pertinent Vitals/Pain Pain Assessment: No/denies pain    Home Living                      Prior Function            PT Goals (current goals can now be found in the care plan  section) Acute Rehab PT Goals Patient Stated Goal: to get the drain out and go home PT Goal Formulation: With patient Time For Goal Achievement: 03/22/21 Potential to Achieve Goals: Good Progress towards PT goals: Progressing toward goals    Frequency    Min 4X/week      PT Plan Current plan remains appropriate    Co-evaluation              AM-PAC PT "6 Clicks" Mobility   Outcome Measure  Help needed turning from your back to your side while in a flat bed without using bedrails?: A Little Help needed moving from  lying on your back to sitting on the side of a flat bed without using bedrails?: A Little Help needed moving to and from a bed to a chair (including a wheelchair)?: A Little Help needed standing up from a chair using your arms (e.g., wheelchair or bedside chair)?: A Little Help needed to walk in hospital room?: A Little Help needed climbing 3-5 steps with a railing? : A Little 6 Click Score: 18    End of Session Equipment Utilized During Treatment: Gait belt Activity Tolerance: Patient tolerated treatment well Patient left: in chair;with call bell/phone within reach Nurse Communication: Mobility status PT Visit Diagnosis: Unsteadiness on feet (R26.81);Muscle weakness (generalized) (M62.81);Difficulty in walking, not elsewhere classified (R26.2)     Time: 6578-4696 PT Time Calculation (min) (ACUTE ONLY): 23 min  Charges:  $Gait Training: 8-22 mins $Therapeutic Exercise: 8-22 mins                     Jakorey Mcconathy A. Gilford Rile PT, DPT Acute Rehabilitation Services Pager [phone] Office [phone]    Linna Hoff 03/13/2021, 5:07 PM

## 2021-03-13 NOTE — Progress Notes (Signed)
Occupational Therapy Treatment Patient Details Name: Scott Snyder MRN: [id number] DOB: [birth year] Today's Date: 03/13/2021   History of present illness 25yo male who presented to ED with HA associated with n/v and recent development of incoordination and feeling very off balance. 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 Pt underwent crani for resection of L cerebellar tumor with EVD placement on 9/26. PMH: unremarkable   OT comments  Pt progressing toward goals this session with continued balance challenges. Pt currently min (A) with some transfer. Recommendation for outpatient.    Recommendations for follow up therapy are one component of a multi-disciplinary discharge planning process, led by the attending physician.  Recommendations may be updated based on patient status, additional functional criteria and insurance authorization.    Follow Up Recommendations  Outpatient OT    Equipment Recommendations  None recommended by OT    Recommendations for Other Services      Precautions / Restrictions Precautions Precautions: Other (comment) Precaution Comments: EVD, clamped prior to mobility       Mobility Bed Mobility               General bed mobility comments: oob in chair    Transfers Overall transfer level: Needs assistance   Transfers: Sit to/from Stand Sit to Stand: Min guard              Balance Overall balance assessment: Needs assistance Sitting-balance support: No upper extremity supported;Feet supported Sitting balance-Leahy Scale: Normal     Standing balance support: Bilateral upper extremity supported;During functional activity Standing balance-Leahy Scale: Fair               High level balance activites: Side stepping;Backward walking;Direction changes;Turns (min to min guard (A))             ADL either performed or assessed with clinical judgement   ADL Overall ADL's : Needs assistance/impaired                      Lower Body Dressing: Modified independent;Sitting/lateral leans Lower Body Dressing Details (indicate cue type and reason): figure 4 cross to don socks Toilet Transfer: Min guard           Functional mobility during ADLs: Min guard General ADL Comments: pt demonstrates increased ankle strategies and taking resistance in static standing. pt reaching OOB >6 inches bil UE. pt able to pick up item of floor. pt with increased cervical motion this session     Vision       Perception     Praxis      Cognition Arousal/Alertness: Awake/alert Behavior During Therapy: WFL for tasks assessed/performed Overall Cognitive Status: Within Functional Limits for tasks assessed                                          Exercises     Shoulder Instructions       General Comments VSS EVD clamped RN informed to unclamp at end of session HR 122 max during session    Pertinent Vitals/ Pain       Pain Assessment: No/denies pain  Home Living                                          Prior Functioning/Environment  Frequency  Min 2X/week        Progress Toward Goals  OT Goals(current goals can now be found in the care plan section)  Progress towards OT goals: Progressing toward goals  Acute Rehab OT Goals Patient Stated Goal: to get this whole thing completed OT Goal Formulation: With patient/family Time For Goal Achievement: 03/26/21 Potential to Achieve Goals: Good ADL Goals Pt Will Transfer to Toilet: with modified independence;ambulating Pt Will Perform Tub/Shower Transfer: Shower transfer;ambulating Additional ADL Goal #1: pt will complete DGI with score >19 to indicate decrease fall risk during adls  Plan Discharge plan remains appropriate    Co-evaluation                 AM-PAC OT "6 Clicks" Daily Activity     Outcome Measure   Help from another person eating meals?: None Help from another person taking care  of personal grooming?: None Help from another person toileting, which includes using toliet, bedpan, or urinal?: A Little Help from another person bathing (including washing, rinsing, drying)?: A Little Help from another person to put on and taking off regular upper body clothing?: None Help from another person to put on and taking off regular lower body clothing?: A Little 6 Click Score: 21    End of Session Equipment Utilized During Treatment: Gait belt  OT Visit Diagnosis: Unsteadiness on feet (R26.81)   Activity Tolerance Patient tolerated treatment well   Patient Left in chair;with call bell/phone within reach;with chair alarm set;with family/visitor present   Nurse Communication Mobility status;Precautions        Time: 9326-7124 OT Time Calculation (min): 21 min  Charges: OT General Charges $OT Visit: 1 Visit OT Treatments $Self Care/Home Management : 8-22 mins   Brynn, OTR/L  Acute Rehabilitation Services Pager: [phone] Office: [phone] .   Jeri Modena 03/13/2021, 4:53 PM

## 2021-03-14 ENCOUNTER — Inpatient Hospital Stay (HOSPITAL_COMMUNITY): Payer: BLUE CROSS/BLUE SHIELD

## 2021-03-14 IMAGING — MR MR HEAD W/O CM
11 of 12 series · 42 of 48 positions shown · non-contrast
Comparison: MRI head [DATE], [DATE]

CLINICAL DATA: Left cerebellar mass resection [DATE]

EXAM:
MRI HEAD WITHOUT CONTRAST
TECHNIQUE: Multiplanar, multiecho pulse sequences of the brain and surrounding
structures were obtained without intravenous contrast.

[Series 4: FLAIR · sagittal · 3.0mm · 0.47mm/px · 1 of 53 slices shown]
[im 1/53]
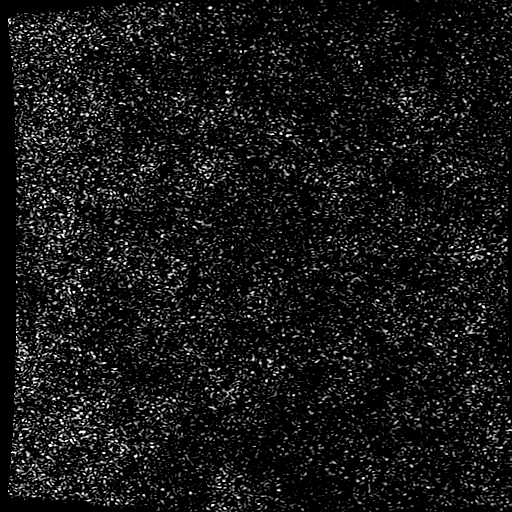

[Series 5: T2 · axial · 5.0mm · 0.43mm/px · 1 of 28 slices shown (1 of 2)]
[im 1/28]
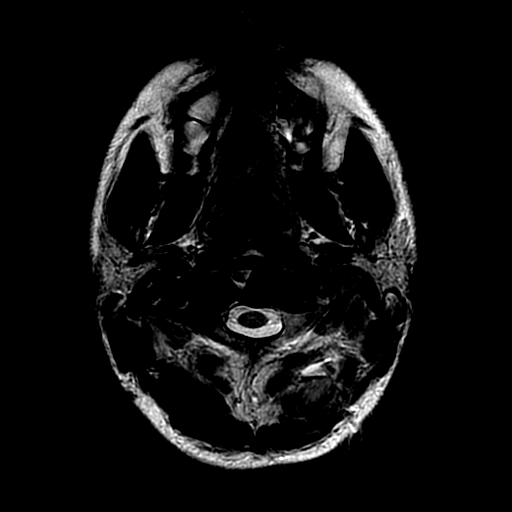

[Series 6: ax dti · axial · 3.0mm · 0.94mm/px · z∈[-87,+93]mm · 15 of 1586 slices shown]
[im 1/1586]
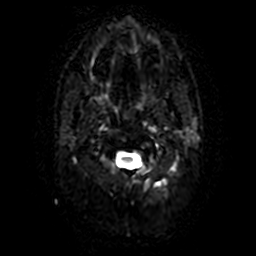
[im 114/1586]
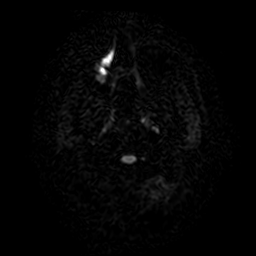
[im 227/1586]
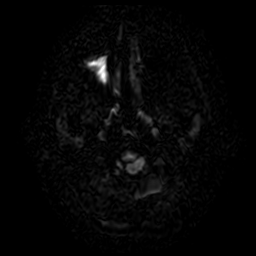
[im 340/1586]
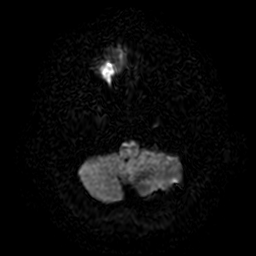
[im 453/1586]
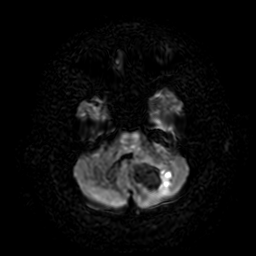
[im 567/1586]
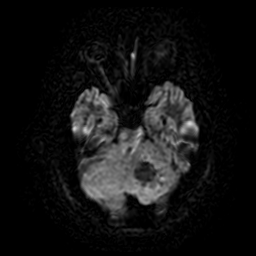
[im 680/1586]
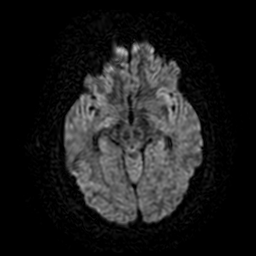
[im 793/1586]
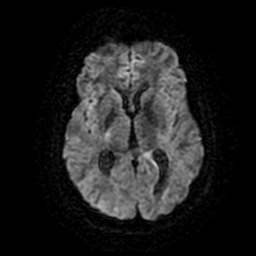
[im 906/1586]
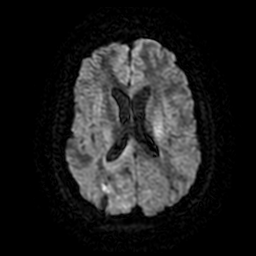
[im 1019/1586]
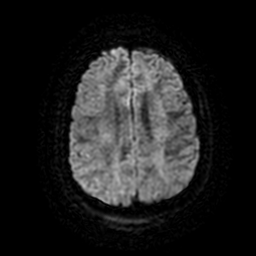
[im 1133/1586]
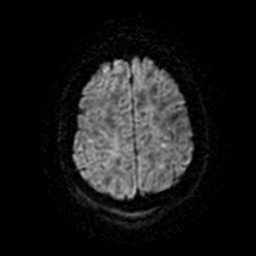
[im 1246/1586]
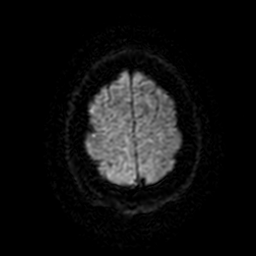
[im 1359/1586]
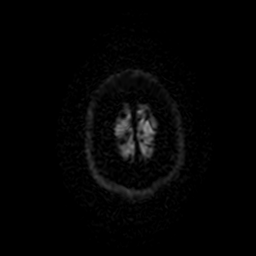
[im 1472/1586]
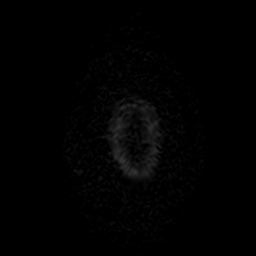
[im 1586/1586]
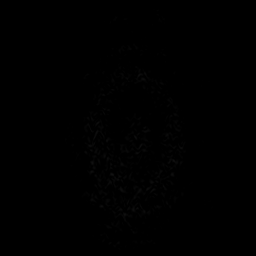

[Series 8: (person_name) · axial · 3.0mm · 0.47mm/px · 1 of 100 slices shown]
[im 1/100]
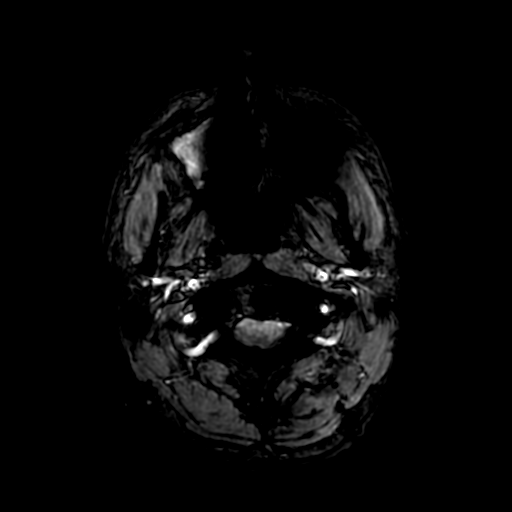

[Series 11: ax 3(person_name) · axial · 1.0mm · 1.02mm/px · z∈[-160,+93]mm · 3 of 254 slices shown]
[im 1/254]
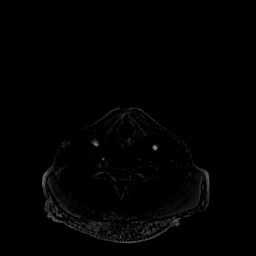
[im 127/254]
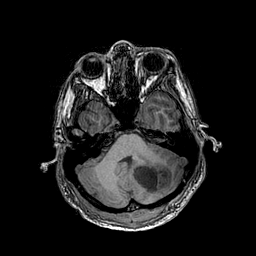
[im 254/254]
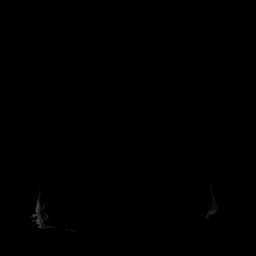

[Series 12: T2 · coronal · 3.0mm · 0.39mm/px · 1 of 59 slices shown (2 of 2)]
[im 1/59]
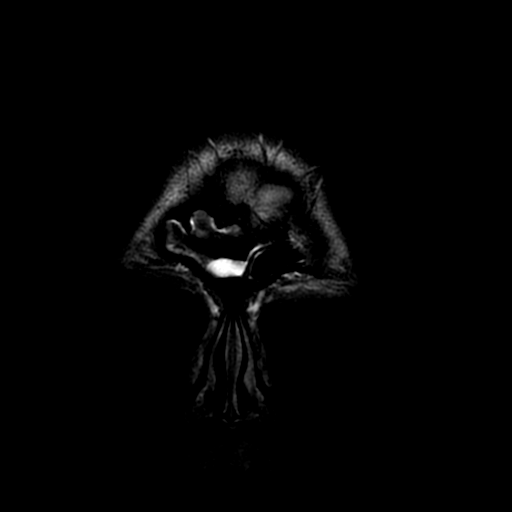

[Series 610: orig: ax dti · axial · 3.0mm · 0.94mm/px · z∈[-87,+93]mm · 13 of 1586 slices shown]
[im 1/1586]
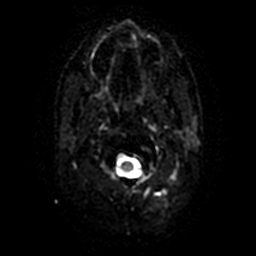
[im 106/1586]
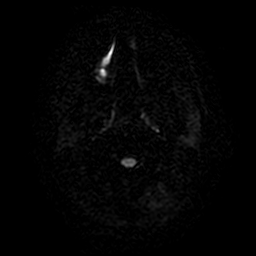
[im 212/1586]
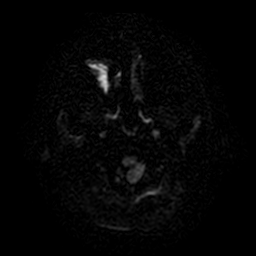
[im 318/1586]
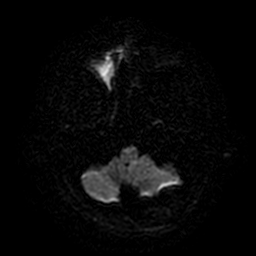
[im 423/1586]
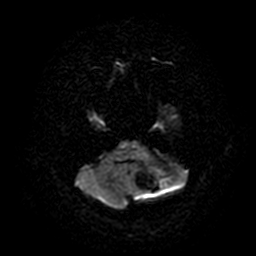
[im 529/1586]
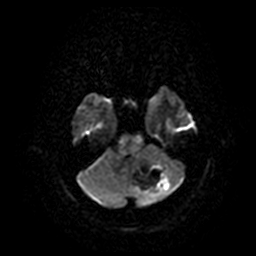
[im 635/1586]
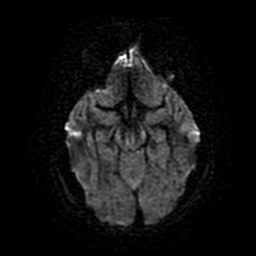
[im 740/1586]
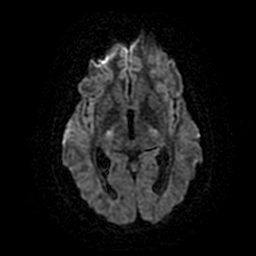
[im 846/1586]
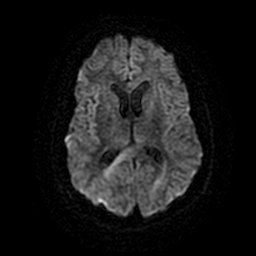
[im 952/1586]
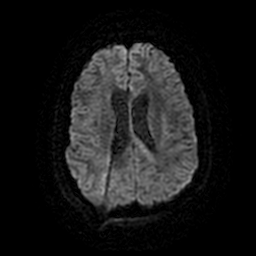
[im 1163/1586]
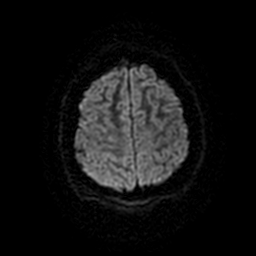
[im 1374/1586]
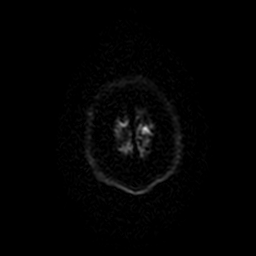
[im 1586/1586]
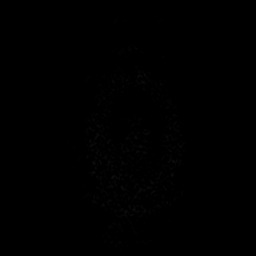

[Series 650: trace · axial · 3.0mm · 0.94mm/px · 1 of 61 slices shown]
[im 1/61]
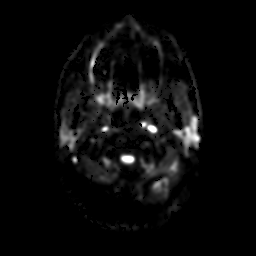

[Series 652: avdc (10^-6 mm²/s)(no-q) · axial · 3.0mm · 0.94mm/px · 1 of 61 slices shown]
[im 1/61]
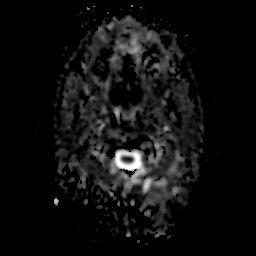

[Series 700: multiplanar reconstruction (mpr) · axial · 1.0mm · 0.50mm/px · z∈[-145,+108]mm · 3 of 255 slices shown (1 of 2)]
[im 1/255]
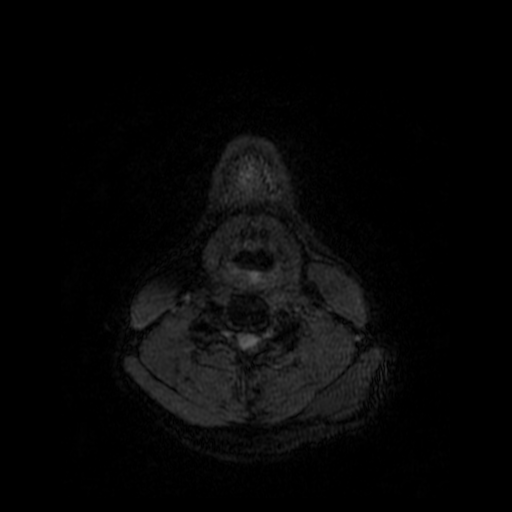
[im 128/255]
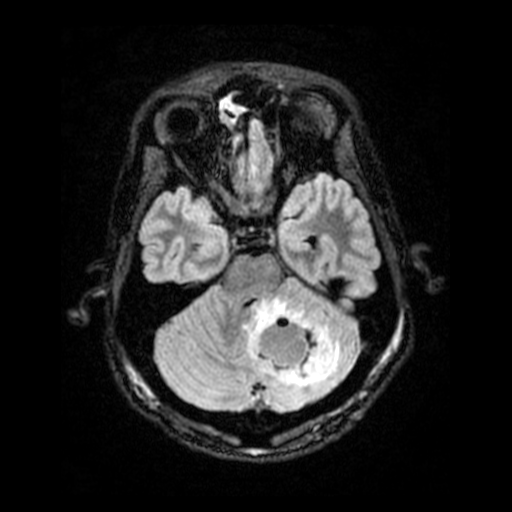
[im 255/255]
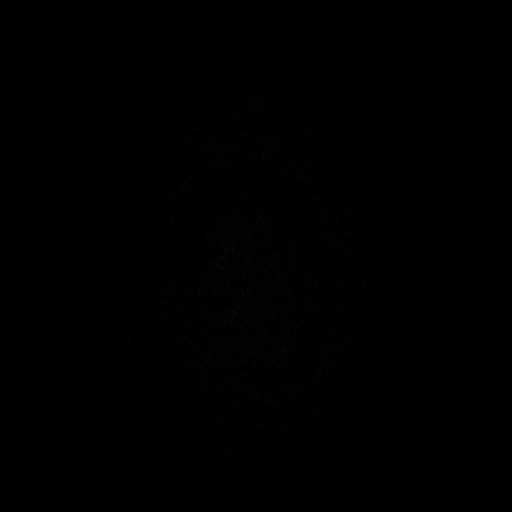

[Series 701: multiplanar reconstruction (mpr) · coronal · 1.0mm · 0.50mm/px · 2 of 243 slices shown (2 of 2)]
[im 1/243]
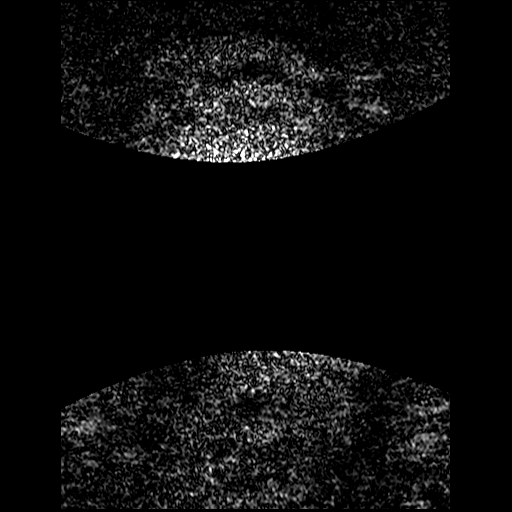
[im 243/243]
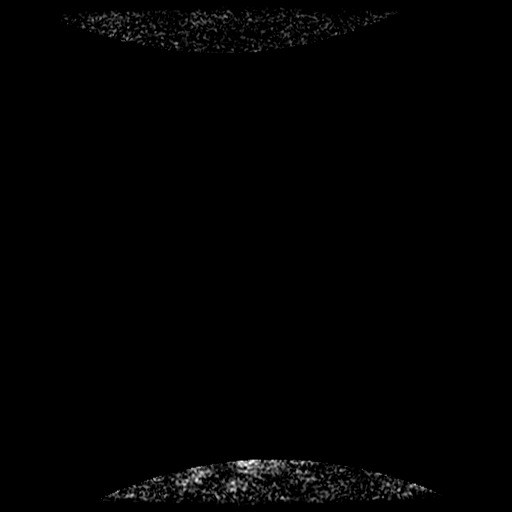

[42 of 48 positions shown; findings below may reference images not displayed]

FINDINGS: Brain: Postop resection of left cerebellar mass. Surgical cavity
filled with fluid and blood measures 26 x 20 mm. Moderate edema
surrounding the surgical cavity has improved. There remains
mass-effect on the fourth ventricle which is effaced and displaced
to the right. There is mass-effect on the aqueduct. Mild restricted
diffusion lateral to the resection cavity otherwise no evidence of
acute infarction.

Right occipital shunt catheter enters the right lateral ventricle
with decompression of the ventricles. Mild ventricular prominence
with improvement since [DATE]. No midline shift. Small amount of
hemorrhage along the shunt tract in the right occipital lobe.

Vascular: Normal arterial flow voids

Skull and upper cervical spine: Left suboccipital craniotomy.

Sinuses/Orbits: Complete opacification right maxillary sinus
unchanged. Mucosal edema throughout the paranasal sinuses most
notably right frontal and ethmoid sinus. Negative orbit

Other: None
IMPRESSION: Postop resection of left cerebellar mass without complicating
features. Improvement in edema surrounding the resection cavity

Right occipital shunt catheter in the right lateral ventricle.
Improvement in mild ventricular prominence compared with [DATE].

## 2021-03-14 NOTE — Progress Notes (Signed)
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 9/26 s/p L craniotomy for tumor resection, 9/27 post-op MRI w/ likely residual tumor, CSF cytology neg  -continue EVD at +10 -cytology negative, makes it even less likely to be lymphoma, will get a new volumetric T2 today and go back to the OR tomorrow afternoon to resect the residual tumor -activity as tolerated, can be up and OOB w/ drain clamped -SCDs/TEDs, hold SQH -NPO p MN  Judith Part  03/14/21 8:46 AM

## 2021-03-15 NOTE — Progress Notes (Signed)
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 9/26 s/p L craniotomy for tumor resection, 9/27 post-op MRI w/ likely residual tumor, CSF cytology neg  -will have to cancel surgery tonight, plan for surgery Wednesday 10/5 AM -will clamp the drain, can come out tomorrow if he tolerates it, then home Sunday if he's doing well and wants to go home a few days before surgery -SCDs/TEDs -restart diet  Judith Part  03/15/21 5:55 PM

## 2021-03-15 NOTE — Progress Notes (Signed)
Physical Therapy Treatment Patient Details Name: Scott Snyder MRN: [id number] DOB: 03/17/1996 Today's Date: 03/15/2021   History of Present Illness 25yo male who presented to ED with HA associated with n/v and recent development of incoordination and feeling very off balance. 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 Pt underwent crani for resection of L cerebellar tumor with EVD placement on 9/26. PMH: unremarkable    PT Comments    Patient with improved gait quality this session and patient reports feeling more steady on feet this date. Patient ambulated 300' with supervision and no AD. Able to perform sudden changes in gait speed and stepping over obstacles during ambulation with no LOB noted. Difficulty with pushing heavy objects and quick turns with mild LOBs but able to self correct. Able to negotiate 3 stairs with supervision and no UE support. Continue to recommend OPPT following discharge to address balance and strength deficits.     Recommendations for follow up therapy are one component of a multi-disciplinary discharge planning process, led by the attending physician.  Recommendations may be updated based on patient status, additional functional criteria and insurance authorization.  Follow Up Recommendations  Outpatient PT;Supervision/Assistance - 24 hour     Equipment Recommendations  None recommended by PT    Recommendations for Other Services       Precautions / Restrictions Precautions Precautions: Other (comment) Precaution Comments: EVD, clamped prior to mobility Restrictions Weight Bearing Restrictions: No     Mobility  Bed Mobility Overal bed mobility: Modified Independent                  Transfers Overall transfer level: Needs assistance Equipment used: None Transfers: Sit to/from Stand Sit to Stand: Supervision         General transfer comment: supervision for safety  Ambulation/Gait Ambulation/Gait assistance: Supervision Gait  Distance (Feet): 300 Feet Assistive device: None Gait Pattern/deviations: Step-through pattern;Decreased stride length;Drifts right/left     General Gait Details: improved gait quality this session. Difficulty pushing open doors and quick turns with mild LOBs but able to self correct. Patient reports feeling more steady on feet today. Able to increase and decrease gait speed and step over objects with no LOB or unsteadiness   Stairs Stairs: Yes Stairs assistance: Supervision Stair Management: No rails;Alternating pattern;Forwards Number of Stairs: 3 General stair comments: supervision for safety and line management   Wheelchair Mobility    Modified Rankin (Stroke Patients Only)       Balance Overall balance assessment: Needs assistance Sitting-balance support: No upper extremity supported;Feet supported Sitting balance-Leahy Scale: Normal     Standing balance support: No upper extremity supported;During functional activity Standing balance-Leahy Scale: Fair               High level balance activites: Turns;Head turns;Direction changes;Other (comment) (increase/decrease gait speed, stepping over objects)              Cognition Arousal/Alertness: Awake/alert Behavior During Therapy: WFL for tasks assessed/performed Overall Cognitive Status: Within Functional Limits for tasks assessed                                        Exercises      General Comments        Pertinent Vitals/Pain Pain Assessment: No/denies pain    Home Living  Prior Function            PT Goals (current goals can now be found in the care plan section) Acute Rehab PT Goals Patient Stated Goal: to have surgery and go home PT Goal Formulation: With patient Time For Goal Achievement: 03/22/21 Potential to Achieve Goals: Good Progress towards PT goals: Progressing toward goals    Frequency    Min 4X/week      PT Plan Current  plan remains appropriate    Co-evaluation              AM-PAC PT "6 Clicks" Mobility   Outcome Measure  Help needed turning from your back to your side while in a flat bed without using bedrails?: None Help needed moving from lying on your back to sitting on the side of a flat bed without using bedrails?: None Help needed moving to and from a bed to a chair (including a wheelchair)?: A Little Help needed standing up from a chair using your arms (e.g., wheelchair or bedside chair)?: A Little Help needed to walk in hospital room?: A Little Help needed climbing 3-5 steps with a railing? : A Little 6 Click Score: 20    End of Session Equipment Utilized During Treatment: Gait belt Activity Tolerance: Patient tolerated treatment well Patient left: in bed;with call bell/phone within reach;with family/visitor present Nurse Communication: Mobility status PT Visit Diagnosis: Unsteadiness on feet (R26.81);Muscle weakness (generalized) (M62.81);Difficulty in walking, not elsewhere classified (R26.2)     Time: 5027-7412 PT Time Calculation (min) (ACUTE ONLY): 18 min  Charges:  $Gait Training: 8-22 mins                     Gabriella Guile A. Gilford Rile PT, DPT Acute Rehabilitation Services Pager [phone] Office [phone]    Linna Hoff 03/15/2021, 9:12 AM

## 2021-03-16 MED ORDER — ONDANSETRON HCL 4 MG PO TABS: 4.0000 mg | ORAL_TABLET | ORAL | 0 refills | Status: DC | PRN

## 2021-03-16 MED ORDER — DEXAMETHASONE 2 MG PO TABS: 2.0000 mg | ORAL_TABLET | Freq: Two times a day (BID) | ORAL | 0 refills | Status: DC

## 2021-03-16 MED ORDER — HYDROCODONE-ACETAMINOPHEN 5-325 MG PO TABS: 1.0000 | ORAL_TABLET | Freq: Four times a day (QID) | ORAL | 0 refills | Status: DC | PRN

## 2021-03-16 MED ORDER — POLYETHYLENE GLYCOL 3350 17 G PO PACK: 17.0000 g | PACK | Freq: Every day | ORAL | 0 refills | Status: DC | PRN

## 2021-03-16 NOTE — Progress Notes (Signed)
Discharge instructions given to patient. Patient pushed out to the car in the wheelchair by NT.

## 2021-03-16 NOTE — Discharge Summary (Addendum)
Physician Discharge Summary     Providing Compassionate, Quality Care - Together   Patient ID: Scott Snyder MRN: [id number] DOB/AGE: June 11, 1996 25 y.o.  Admit date: 03/06/2021 Discharge date: 03/16/2021  Admission Diagnoses: Cerebellar tumor  Discharge Diagnoses:  Principal Problem:   Cerebellar tumor (Scott Snyder) Active Problems:   Nausea and vomiting   Headache   Incoordination   Obstructive hydrocephalus (HCC)   Bradycardia, sinus   Brain tumor [facility])   Discharged Condition: good  Hospital Course: Patient underwent a suboccipital craniotomy for resection of tumor by Dr. Zada Finders on 03/11/2021. He was admitted to 4N25  following recovery from anesthesia. His postoperative course has been uncomplicated. His EVD was clamped on 03/15/2021 and removed on 03/16/2021 without issue. He has worked with both physical and occupational therapies who feel the patient is ready for discharge home with outpatient therapies to work on balance. He is ambulating independently and without difficulty. He is tolerating a normal diet. His pain is well-controlled with oral pain medication. He is ready for discharge home, with the plan to return for surgery on Wednesday.   Consults: None  Significant Diagnostic Studies: radiology: MR BRAIN WO CONTRAST  Result Date: 03/14/2021 CLINICAL DATA:  Left cerebellar mass resection 03/07/2021 EXAM: MRI HEAD WITHOUT CONTRAST TECHNIQUE: Multiplanar, multiecho pulse sequences of the brain and surrounding structures were obtained without intravenous contrast. COMPARISON:  MRI head 03/06/2021, 03/11/2021 FINDINGS: Brain: Postop resection of left cerebellar mass. Surgical cavity filled with fluid and blood measures 26 x 20 mm. Moderate edema surrounding the surgical cavity has improved. There remains mass-effect on the fourth ventricle which is effaced and displaced to the right. There is mass-effect on the aqueduct. Mild restricted diffusion lateral to the resection cavity  otherwise no evidence of acute infarction. Right occipital shunt catheter enters the right lateral ventricle with decompression of the ventricles. Mild ventricular prominence with improvement since 03/11/2021. No midline shift. Small amount of hemorrhage along the shunt tract in the right occipital lobe. Vascular: Normal arterial flow voids Skull and upper cervical spine: Left suboccipital craniotomy. Sinuses/Orbits: Complete opacification right maxillary sinus unchanged. Mucosal edema throughout the paranasal sinuses most notably right frontal and ethmoid sinus. Negative orbit Other: None IMPRESSION: Postop resection of left cerebellar mass without complicating features. Improvement in edema surrounding the resection cavity Right occipital shunt catheter in the right lateral ventricle. Improvement in mild ventricular prominence compared with 03/11/2021. Electronically Signed   By: Franchot Gallo M.D.   On: 03/14/2021 13:39     Treatments: surgery:  Suboccipital craniotomy for tumor resection, placement of external ventricular drain  Discharge Exam: Blood pressure 139/88, pulse 62, temperature 98.8 F (37.1 C), temperature source Oral, resp. rate 16, height 6\' 3"  (1.905 m), weight 93 kg, SpO2 97 %.  Alert and oriented x 4 PERRLA CN II-XII grossly intact MAE, Strength and sensation intact Incision is clean, dry, and intact   Disposition: Discharge disposition: 01-Home or Self Care        Allergies as of 03/16/2021   No Known Allergies      Medication List     STOP taking these medications    azithromycin 250 MG tablet Commonly known as: ZITHROMAX   budesonide 32 MCG/ACT nasal spray Commonly known as: Rhinocort Allergy   predniSONE 10 MG tablet Commonly known as: DELTASONE       TAKE these medications    butalbital-acetaminophen-caffeine 50-325-40-30 MG capsule Commonly known as: FIORICET WITH CODEINE Take 1 capsule by mouth every 4 (four) hours as needed for  headache.   dexamethasone 2 MG tablet Commonly known as: DECADRON Take 1 tablet (2 mg total) by mouth every 12 (twelve) hours.   HYDROcodone-acetaminophen 5-325 MG tablet Commonly known as: NORCO/VICODIN Take 1 tablet by mouth every 6 (six) hours as needed for moderate pain.   ondansetron 4 MG disintegrating tablet Commonly known as: Zofran ODT Take 1 tablet (4 mg total) by mouth every 8 (eight) hours as needed for nausea or vomiting.   ondansetron 4 MG tablet Commonly known as: ZOFRAN Take 1 tablet (4 mg total) by mouth every 4 (four) hours as needed for nausea or vomiting.   polyethylene glycol 17 g packet Commonly known as: MIRALAX / GLYCOLAX Take 17 g by mouth daily as needed for mild constipation.   sodium chloride 0.65 % Soln nasal spray Commonly known as: OCEAN Place 1 spray into both nostrils daily as needed for congestion.   SUMAtriptan 50 MG tablet Commonly known as: Imitrex Take one tab at onset of headache. May repeat in 2 hours if headache persists or recurs. Max of 2 tabs daily        Follow-up Information     Judith Part, MD Follow up.   Specialty: Neurosurgery Why: Surgery scheduled for 03/20/2021 Contact information: [place] [id number] [phone]                 Signed: Viona Gilmore, DNP, AGNP-C Nurse Practitioner  [facility] [address] [place], [place], [place] [address] P: [phone]    F: [phone]  03/16/2021, 10:59 AM

## 2021-03-16 NOTE — Discharge Instructions (Addendum)
Wound Care Leave incision open to air. Fine to shower. Pat incision dry.   Do not put any creams, lotions, or ointments on incision.  Activity Walk each and every day, increasing distance each day. No lifting greater than 5 lbs. No driving for 2 weeks; may ride as a passenger locally.  Diet Resume your normal diet.   Return to Work Will be discussed at your follow up appointment.  Call Your Doctor If Any of These Occur Redness, drainage, or swelling at the wound.  Temperature greater than 101 degrees. Severe pain not relieved by pain medication. Incision starts to come apart.  Follow Up Appt Call [phone] for  any problems.

## 2021-03-18 ENCOUNTER — Inpatient Hospital Stay: Payer: BLUE CROSS/BLUE SHIELD | Attending: Neurological Surgery

## 2021-03-18 ENCOUNTER — Encounter (HOSPITAL_COMMUNITY): Payer: Self-pay | Admitting: Neurological Surgery

## 2021-03-19 ENCOUNTER — Encounter (HOSPITAL_COMMUNITY): Payer: Self-pay | Admitting: Neurological Surgery

## 2021-03-19 ENCOUNTER — Other Ambulatory Visit: Payer: Self-pay | Admitting: Radiation Therapy

## 2021-03-19 NOTE — Anesthesia Preprocedure Evaluation (Addendum)
Anesthesia Evaluation  Patient identified by MRN, date of birth, ID band Patient awake    Reviewed: Allergy & Precautions, NPO status , Patient's Chart, lab work & pertinent test results  Airway Mallampati: I  TM Distance: >3 FB Neck ROM: Full    Dental  (+) Teeth Intact, Dental Advisory Given   Pulmonary neg pulmonary ROS, Patient abstained from smoking.,    Pulmonary exam normal breath sounds clear to auscultation       Cardiovascular negative cardio ROS Normal cardiovascular exam Rhythm:Regular Rate:Normal     Neuro/Psych  Headaches, negative psych ROS   GI/Hepatic negative GI ROS, Neg liver ROS,   Endo/Other  negative endocrine ROS  Renal/GU negative Renal ROS  negative genitourinary   Musculoskeletal negative musculoskeletal ROS (+)   Abdominal   Peds  Hematology negative hematology ROS (+)   Anesthesia Other Findings 25 y.o. 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 9/26 s/p L craniotomy for tumor resection, 9/27 post-op MRI w/ likely residual tumor  Reproductive/Obstetrics                            Anesthesia Physical Anesthesia Plan  ASA: 2  Anesthesia Plan: General   Post-op Pain Management:    Induction: Intravenous  PONV Risk Score and Plan: 2 and Midazolam, Dexamethasone and Ondansetron  Airway Management Planned: Oral ETT  Additional Equipment: Arterial line  Intra-op Plan:   Post-operative Plan: Extubation in OR  Informed Consent: I have reviewed the patients History and Physical, chart, labs and discussed the procedure including the risks, benefits and alternatives for the proposed anesthesia with the patient or authorized representative who has indicated his/her understanding and acceptance.     Dental advisory given  Plan Discussed with: CRNA  Anesthesia Plan Comments:         Anesthesia Quick Evaluation

## 2021-03-20 ENCOUNTER — Other Ambulatory Visit: Payer: Self-pay

## 2021-03-20 ENCOUNTER — Encounter (HOSPITAL_COMMUNITY): Payer: Self-pay | Admitting: Neurological Surgery

## 2021-03-20 ENCOUNTER — Encounter (HOSPITAL_COMMUNITY)
Admission: RE | Disposition: A | Discharge status: Home / Self Care | Payer: Self-pay | Source: Home / Self Care | Attending: Neurological Surgery

## 2021-03-20 ENCOUNTER — Inpatient Hospital Stay (HOSPITAL_COMMUNITY): Payer: BLUE CROSS/BLUE SHIELD

## 2021-03-20 ENCOUNTER — Inpatient Hospital Stay (HOSPITAL_COMMUNITY): Payer: BLUE CROSS/BLUE SHIELD | Admitting: Anesthesiology

## 2021-03-20 ENCOUNTER — Inpatient Hospital Stay (HOSPITAL_COMMUNITY)
Admission: RE | Admit: 2021-03-20 | Discharge: 2021-03-21 | DRG: 027 | Disposition: A | Discharge status: Home / Self Care | Payer: BLUE CROSS/BLUE SHIELD | Attending: Neurological Surgery | Admitting: Neurological Surgery

## 2021-03-20 DIAGNOSIS — C716 Malignant neoplasm of cerebellum: Secondary | ICD-10-CM | POA: Diagnosis present

## 2021-03-20 DIAGNOSIS — D496 Neoplasm of unspecified behavior of brain: Secondary | ICD-10-CM | POA: Diagnosis present

## 2021-03-20 DIAGNOSIS — Z20822 Contact with and (suspected) exposure to covid-19: Secondary | ICD-10-CM | POA: Diagnosis present

## 2021-03-20 HISTORY — PX: SUBOCCIPITAL CRANIECTOMY CERVICAL LAMINECTOMY: SHX5404

## 2021-03-20 HISTORY — DX: Headache, unspecified: R51.9

## 2021-03-20 HISTORY — PX: APPLICATION OF CRANIAL NAVIGATION: SHX6578

## 2021-03-20 LAB — CBC
HCT: 35.9 % — ABNORMAL LOW (ref 39.0–52.0)
Hemoglobin: 12.1 g/dL — ABNORMAL LOW (ref 13.0–17.0)
MCH: 30.5 pg (ref 26.0–34.0)
MCHC: 33.7 g/dL (ref 30.0–36.0)
MCV: 90.4 fL (ref 80.0–100.0)
Platelets: 278 10*3/uL (ref 150–400)
RBC: 3.97 MIL/uL — ABNORMAL LOW (ref 4.22–5.81)
RDW: 12.1 % (ref 11.5–15.5)
WBC: 19 10*3/uL — ABNORMAL HIGH (ref 4.0–10.5)
nRBC: 0 % (ref 0.0–0.2)

## 2021-03-20 LAB — CREATININE, SERUM
Creatinine, Ser: 0.97 mg/dL (ref 0.61–1.24)
GFR, Estimated: >60 mL/min (ref >60)

## 2021-03-20 LAB — SARS CORONAVIRUS 2 BY RT PCR (HOSPITAL ORDER, PERFORMED IN ~~LOC~~ HOSPITAL LAB): SARS Coronavirus 2: NEGATIVE

## 2021-03-20 IMAGING — MR MR HEAD W/O CM
11 of 12 series · 41 of 48 positions shown · non-contrast
Comparison: MRI from [DATE].

CLINICAL DATA: Follow-up examination for repeat craniotomy for
tumor resection.

EXAM:
MRI HEAD WITHOUT CONTRAST
TECHNIQUE: Multiplanar, multiecho pulse sequences of the brain and surrounding
structures were obtained without intravenous contrast.

[Series 2: FLAIR · sagittal · 3.0mm · 0.47mm/px · 1 of 48 slices shown]
[im 1/48]
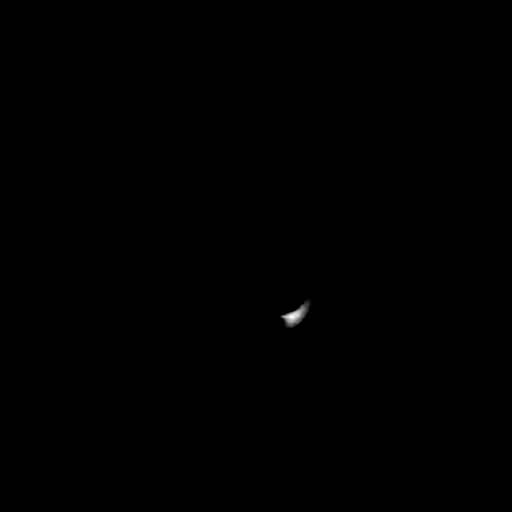

[Series 3: T2 · axial · 5.0mm · 0.43mm/px · 1 of 28 slices shown]
[im 1/28]
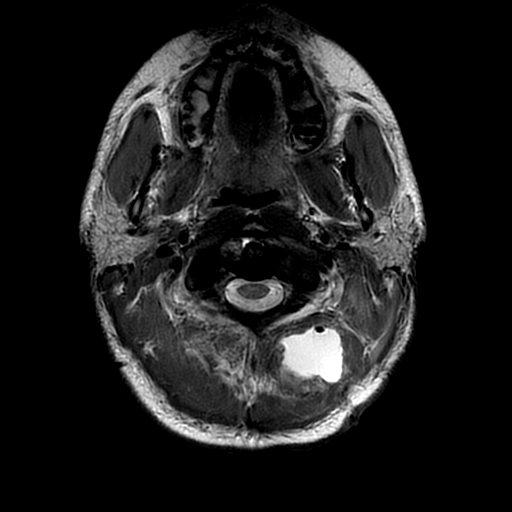

[Series 4: ax dti · axial · 3.0mm · 0.94mm/px · z∈[-56,+124]mm · 15 of 1586 slices shown]
[im 1/1586]
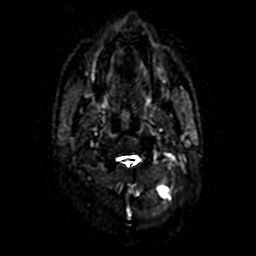
[im 114/1586]
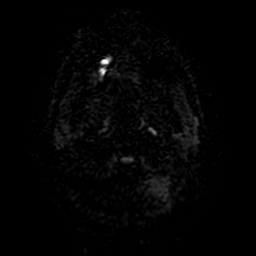
[im 227/1586]
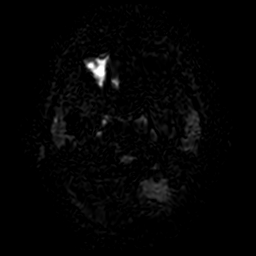
[im 340/1586]
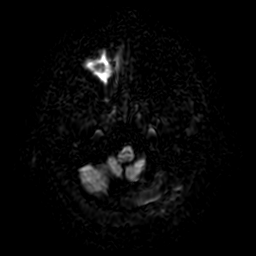
[im 453/1586]
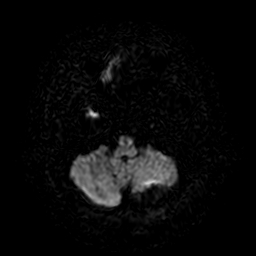
[im 567/1586]
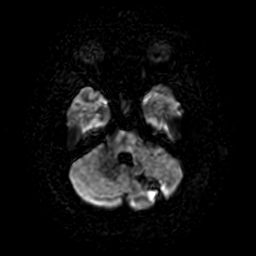
[im 680/1586]
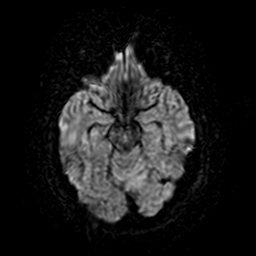
[im 793/1586]
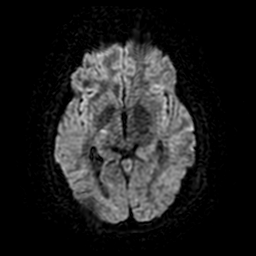
[im 906/1586]
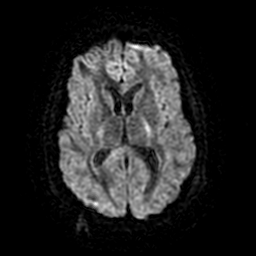
[im 1019/1586]
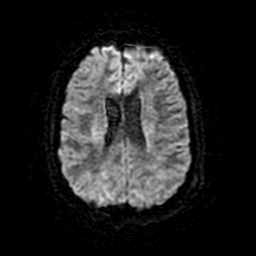
[im 1133/1586]
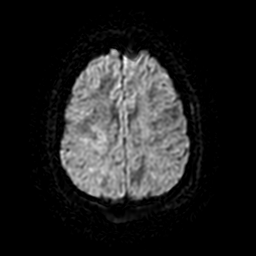
[im 1246/1586]
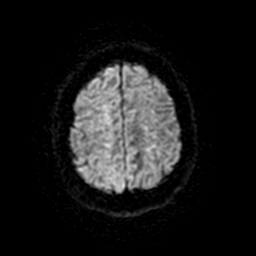
[im 1359/1586]
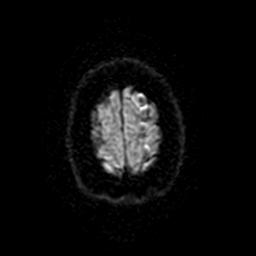
[im 1472/1586]
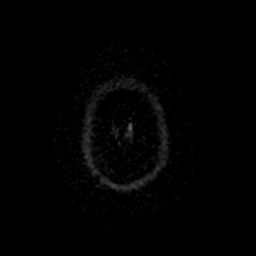
[im 1586/1586]
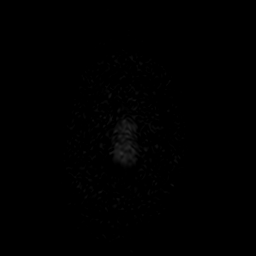

[Series 6: (person_name) · axial · 3.0mm · 0.47mm/px · 1 of 112 slices shown]
[im 1/112]
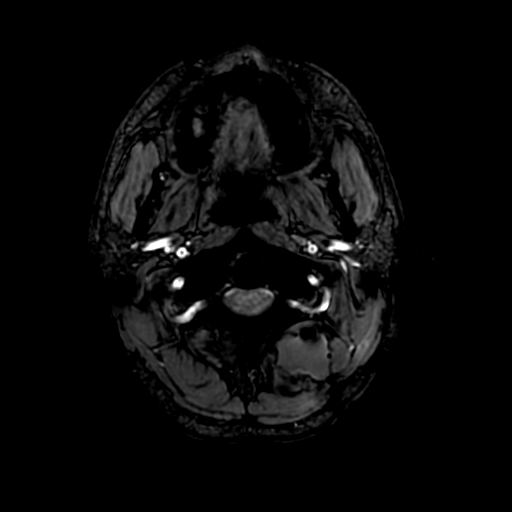

[Series 7: ax 3(person_name) · axial · 1.0mm · 1.02mm/px · z∈[-49,+128]mm · 2 of 178 slices shown]
[im 1/178]
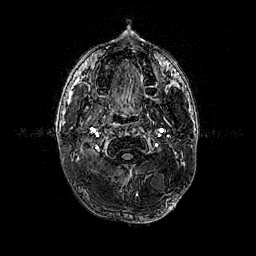
[im 178/178]
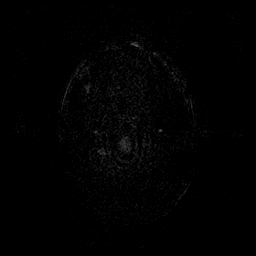

[Series 410: orig: ax dti · axial · 3.0mm · 0.94mm/px · z∈[-56,+124]mm · 13 of 1586 slices shown]
[im 1/1586]
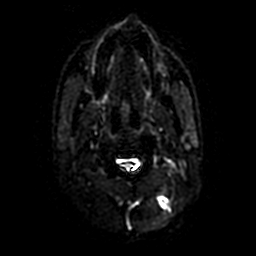
[im 106/1586]
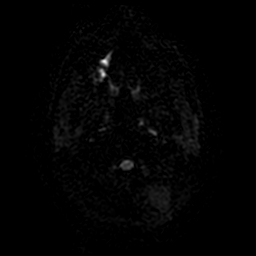
[im 212/1586]
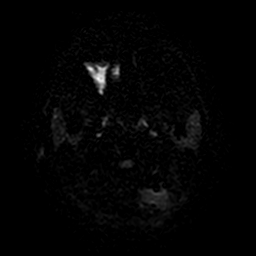
[im 318/1586]
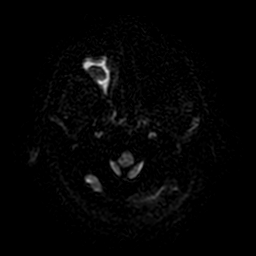
[im 423/1586]
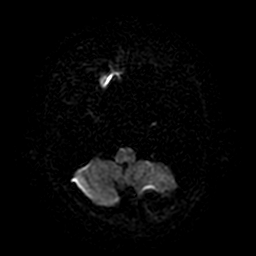
[im 529/1586]
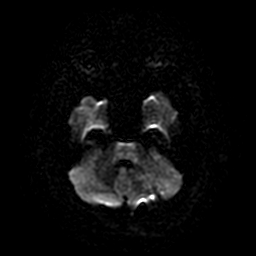
[im 635/1586]
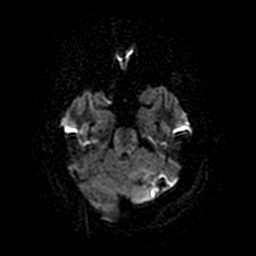
[im 740/1586]
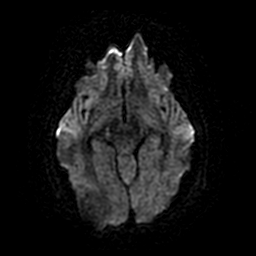
[im 846/1586]
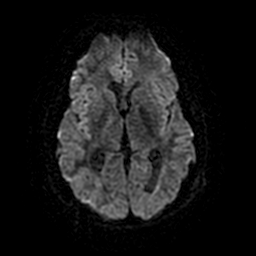
[im 952/1586]
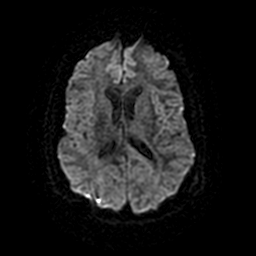
[im 1163/1586]
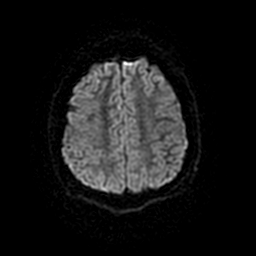
[im 1374/1586]
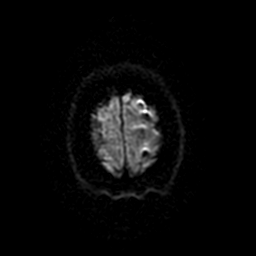
[im 1586/1586]
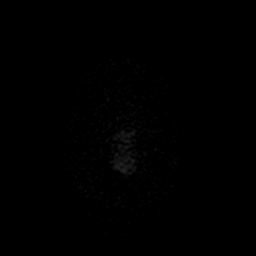

[Series 450: trace · axial · 3.0mm · 0.94mm/px · 1 of 61 slices shown]
[im 1/61]
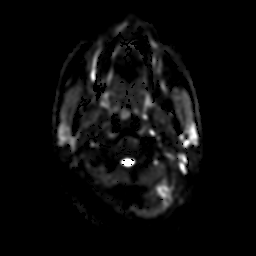

[Series 452: avdc (10^-6 mm²/s)(no-q) · axial · 3.0mm · 0.94mm/px · 1 of 61 slices shown]
[im 1/61]
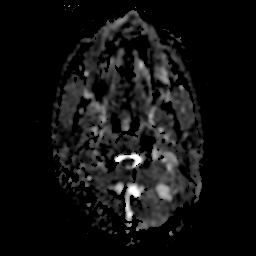

[Series 500: multiplanar reconstruction (mpr) · axial · 1.0mm · 0.50mm/px · z∈[-115,+140]mm · 3 of 256 slices shown (1 of 2)]
[im 1/256]
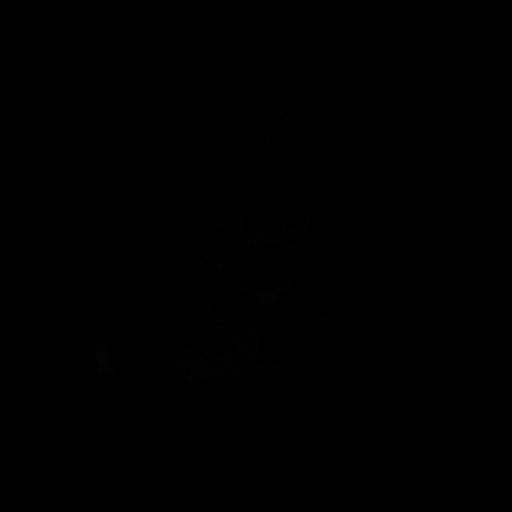
[im 128/256]
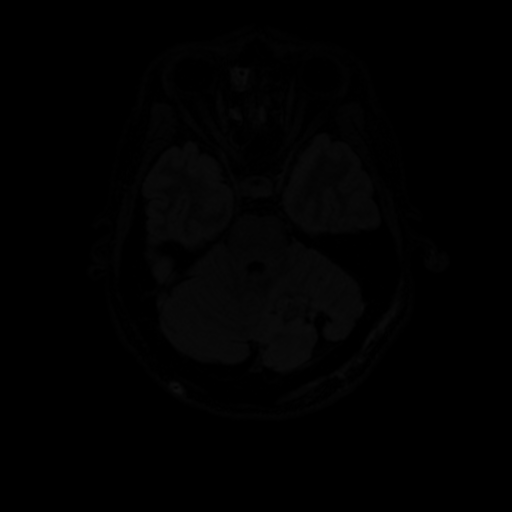
[im 256/256]
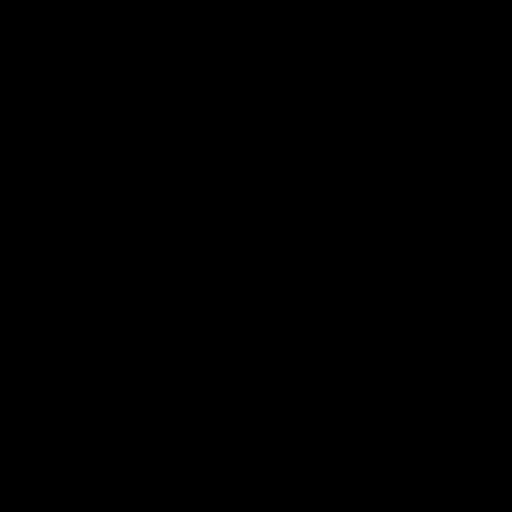

[Series 501: multiplanar reconstruction (mpr) · coronal · 1.0mm · 0.50mm/px · 2 of 242 slices shown (2 of 2)]
[im 1/242]
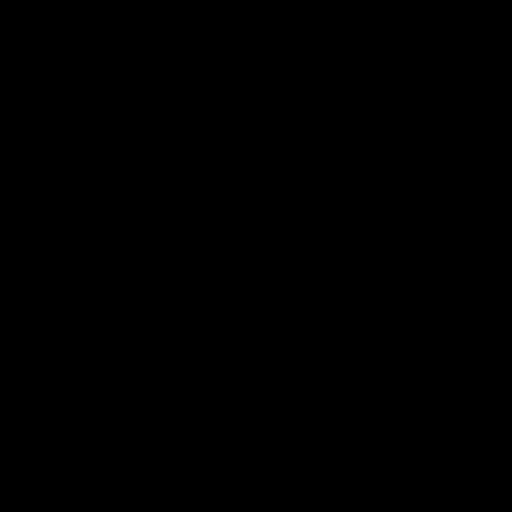
[im 242/242]
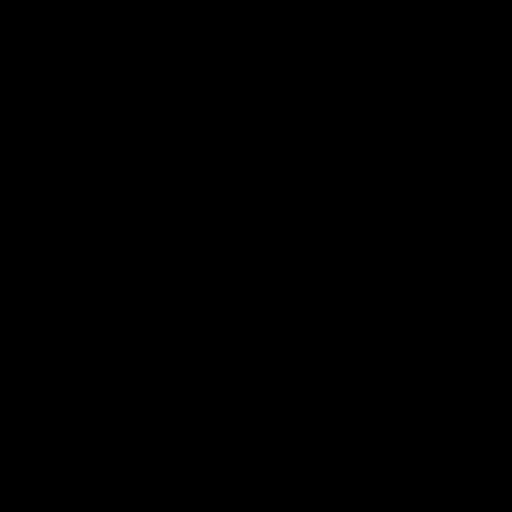

[Series 600: filt_pha: (person_name) · axial · 3.0mm · 0.47mm/px · 1 of 111 slices shown]
[im 1/111]
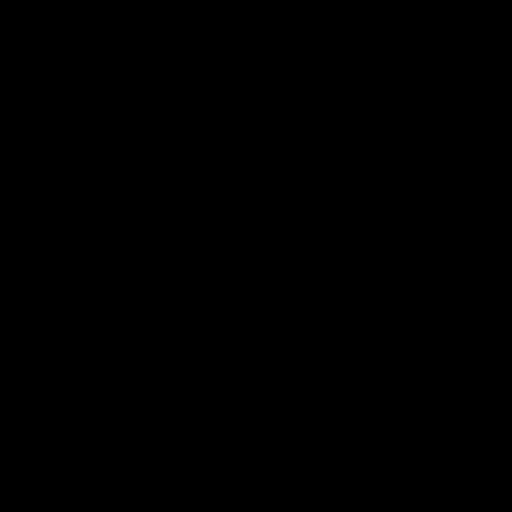

[41 of 48 positions shown; findings below may reference images not displayed]

FINDINGS: Brain: Postoperative changes from repeat left occipital craniotomy
for tumor resection are seen. Scattered postoperative blood products
seen within the resection cavity. Previously seen collection at the
resection cavity is no longer visualized, likely a vacuo aided.
Additionally, there has been interval resection of previously seen
residual tumor about the resection cavity. No definite residual
tumor now seen on this noncontrast examination. Improved edema
within the left cerebellum with essentially resolved mass effect on
the adjacent fourth ventricle. No visible complications.

Interval removal of a right occipital shunt catheter. Residual blood
products and/or pneumocephalus along the catheter tract. Overall
ventricular size is relatively stable without hydrocephalus.

Remainder the brain is otherwise stable with no other new
abnormality.

Vascular: Major intracranial vascular flow voids are maintained.

Skull and upper cervical spine: Craniocervical junction normal. Bone
marrow signal intensity normal. Sequelae of left occipital
craniotomy. Postoperative collection within the suboccipital soft
tissues measures 3.5 x 2.8 cm (series 3, image 2).

Sinuses/Orbits: Globes and orbital soft tissues within normal
limits. Right-sided paranasal sinus disease, similar to previous.
Mastoid air cells remain clear.

Other: None.
IMPRESSION: 1. Postoperative changes from repeat left occipital craniotomy for
tumor resection without complication. Improved edema within the left
cerebellum with resolved mass effect on the fourth ventricle.
2. Interval removal of a right occipital shunt catheter. Stable
ventricular size without hydrocephalus.

## 2021-03-20 SURGERY — SUBOCCIPITAL CRANIECTOMY CERVICAL LAMINECTOMY/DURAPLASTY
Anesthesia: General

## 2021-03-20 MED ORDER — BACITRACIN 500 UNIT/GM EX OINT
TOPICAL_OINTMENT | CUTANEOUS | Status: DC | PRN
  Administered 2021-03-20: 1 via TOPICAL

## 2021-03-20 MED ORDER — ACETAMINOPHEN 500 MG PO TABS
1000.0000 mg | ORAL_TABLET | Freq: Once | ORAL | Status: AC
  Administered 2021-03-20: 1000 mg via ORAL
  Filled 2021-03-20: qty 2

## 2021-03-20 MED ORDER — BACITRACIN ZINC 500 UNIT/GM EX OINT
TOPICAL_OINTMENT | CUTANEOUS | Status: AC
  Filled 2021-03-20: qty 28.35

## 2021-03-20 MED ORDER — THROMBIN 5000 UNITS EX SOLR
CUTANEOUS | Status: AC
  Filled 2021-03-20: qty 5000

## 2021-03-20 MED ORDER — ORAL CARE MOUTH RINSE: 15.0000 mL | Freq: Once | OROMUCOSAL | Status: AC

## 2021-03-20 MED ORDER — ONDANSETRON HCL 4 MG/2ML IJ SOLN: 4.0000 mg | INTRAMUSCULAR | Status: DC | PRN

## 2021-03-20 MED ORDER — ACETAMINOPHEN 650 MG RE SUPP: 650.0000 mg | RECTAL | Status: DC | PRN

## 2021-03-20 MED ORDER — PROPOFOL 10 MG/ML IV BOLUS
INTRAVENOUS | Status: AC
  Filled 2021-03-20: qty 40

## 2021-03-20 MED ORDER — DEXAMETHASONE SODIUM PHOSPHATE 10 MG/ML IJ SOLN
INTRAMUSCULAR | Status: DC | PRN
  Administered 2021-03-20: 10 mg via INTRAVENOUS

## 2021-03-20 MED ORDER — ONDANSETRON HCL 4 MG/2ML IJ SOLN
INTRAMUSCULAR | Status: AC
  Filled 2021-03-20: qty 2

## 2021-03-20 MED ORDER — FENTANYL CITRATE (PF) 100 MCG/2ML IJ SOLN
25.0000 ug | INTRAMUSCULAR | Status: DC | PRN
  Administered 2021-03-20: 50 ug via INTRAVENOUS

## 2021-03-20 MED ORDER — FAMOTIDINE IN NACL 20-0.9 MG/50ML-% IV SOLN
20.0000 mg | Freq: Two times a day (BID) | INTRAVENOUS | Status: DC
  Administered 2021-03-20 – 2021-03-21 (×3): 20 mg via INTRAVENOUS
  Filled 2021-03-20 (×3): qty 50

## 2021-03-20 MED ORDER — SODIUM CHLORIDE 0.9 % IV SOLN: INTRAVENOUS | Status: DC

## 2021-03-20 MED ORDER — LIDOCAINE 2% (20 MG/ML) 5 ML SYRINGE
INTRAMUSCULAR | Status: DC | PRN
  Administered 2021-03-20: 60 mg via INTRAVENOUS

## 2021-03-20 MED ORDER — 0.9 % SODIUM CHLORIDE (POUR BTL) OPTIME
TOPICAL | Status: DC | PRN
Start: 2021-03-20 — End: 2021-03-20
  Administered 2021-03-20: 2000 mL

## 2021-03-20 MED ORDER — LIDOCAINE-EPINEPHRINE 1 %-1:100000 IJ SOLN
INTRAMUSCULAR | Status: AC
  Filled 2021-03-20: qty 1

## 2021-03-20 MED ORDER — DOCUSATE SODIUM 100 MG PO CAPS
100.0000 mg | ORAL_CAPSULE | Freq: Two times a day (BID) | ORAL | Status: DC
  Administered 2021-03-20 – 2021-03-21 (×2): 100 mg via ORAL
  Filled 2021-03-20 (×2): qty 1

## 2021-03-20 MED ORDER — CHLORHEXIDINE GLUCONATE 0.12 % MT SOLN: 15.0000 mL | Freq: Once | OROMUCOSAL | Status: AC

## 2021-03-20 MED ORDER — ESMOLOL HCL 100 MG/10ML IV SOLN
INTRAVENOUS | Status: AC
  Filled 2021-03-20: qty 10

## 2021-03-20 MED ORDER — SALINE SPRAY 0.65 % NA SOLN
1.0000 | Freq: Every day | NASAL | Status: DC | PRN
  Filled 2021-03-20: qty 44

## 2021-03-20 MED ORDER — POLYETHYLENE GLYCOL 3350 17 G PO PACK: 17.0000 g | PACK | Freq: Every day | ORAL | Status: DC | PRN

## 2021-03-20 MED ORDER — SUGAMMADEX SODIUM 200 MG/2ML IV SOLN
INTRAVENOUS | Status: DC | PRN
  Administered 2021-03-20: 200 mg via INTRAVENOUS

## 2021-03-20 MED ORDER — CHLORHEXIDINE GLUCONATE 0.12 % MT SOLN
OROMUCOSAL | Status: AC
  Administered 2021-03-20: 15 mL via OROMUCOSAL
  Filled 2021-03-20: qty 15

## 2021-03-20 MED ORDER — THROMBIN 20000 UNITS EX SOLR
CUTANEOUS | Status: AC
  Filled 2021-03-20: qty 20000

## 2021-03-20 MED ORDER — ONDANSETRON HCL 4 MG PO TABS: 4.0000 mg | ORAL_TABLET | ORAL | Status: DC | PRN

## 2021-03-20 MED ORDER — BUPIVACAINE HCL (PF) 0.5 % IJ SOLN
INTRAMUSCULAR | Status: AC
  Filled 2021-03-20: qty 30

## 2021-03-20 MED ORDER — FENTANYL CITRATE (PF) 250 MCG/5ML IJ SOLN
INTRAMUSCULAR | Status: AC
  Filled 2021-03-20: qty 5

## 2021-03-20 MED ORDER — LIDOCAINE 2% (20 MG/ML) 5 ML SYRINGE
INTRAMUSCULAR | Status: AC
  Filled 2021-03-20: qty 5

## 2021-03-20 MED ORDER — DEXAMETHASONE 4 MG PO TABS
2.0000 mg | ORAL_TABLET | Freq: Two times a day (BID) | ORAL | Status: DC
  Administered 2021-03-20 – 2021-03-21 (×2): 2 mg via ORAL
  Filled 2021-03-20 (×2): qty 1

## 2021-03-20 MED ORDER — MIDAZOLAM HCL 2 MG/2ML IJ SOLN
INTRAMUSCULAR | Status: AC
  Filled 2021-03-20: qty 2

## 2021-03-20 MED ORDER — PROPOFOL 10 MG/ML IV BOLUS
INTRAVENOUS | Status: DC | PRN
  Administered 2021-03-20: 200 mg via INTRAVENOUS
  Administered 2021-03-20: 70 mg via INTRAVENOUS

## 2021-03-20 MED ORDER — THROMBIN 5000 UNITS EX SOLR
OROMUCOSAL | Status: DC | PRN
  Administered 2021-03-20: 5 mL via TOPICAL

## 2021-03-20 MED ORDER — ROCURONIUM BROMIDE 10 MG/ML (PF) SYRINGE
PREFILLED_SYRINGE | INTRAVENOUS | Status: DC | PRN
  Administered 2021-03-20: 30 mg via INTRAVENOUS
  Administered 2021-03-20: 20 mg via INTRAVENOUS
  Administered 2021-03-20: 80 mg via INTRAVENOUS

## 2021-03-20 MED ORDER — CEFAZOLIN SODIUM-DEXTROSE 2-3 GM-%(50ML) IV SOLR
INTRAVENOUS | Status: DC | PRN
  Administered 2021-03-20: 2 g via INTRAVENOUS

## 2021-03-20 MED ORDER — CEFAZOLIN SODIUM-DEXTROSE 2-4 GM/100ML-% IV SOLN
2.0000 g | Freq: Three times a day (TID) | INTRAVENOUS | Status: AC
Start: 2021-03-20 — End: 2021-03-21
  Administered 2021-03-20 – 2021-03-21 (×2): 2 g via INTRAVENOUS
  Filled 2021-03-20 (×2): qty 100

## 2021-03-20 MED ORDER — ONDANSETRON HCL 4 MG/2ML IJ SOLN
INTRAMUSCULAR | Status: DC | PRN
  Administered 2021-03-20: 4 mg via INTRAVENOUS

## 2021-03-20 MED ORDER — PROMETHAZINE HCL 25 MG PO TABS: 12.5000 mg | ORAL_TABLET | ORAL | Status: DC | PRN

## 2021-03-20 MED ORDER — HYDROMORPHONE HCL 1 MG/ML IJ SOLN: 0.5000 mg | INTRAMUSCULAR | Status: DC | PRN

## 2021-03-20 MED ORDER — DEXAMETHASONE SODIUM PHOSPHATE 10 MG/ML IJ SOLN
INTRAMUSCULAR | Status: AC
  Filled 2021-03-20: qty 1

## 2021-03-20 MED ORDER — HYDROCODONE-ACETAMINOPHEN 5-325 MG PO TABS: 1.0000 | ORAL_TABLET | ORAL | Status: DC | PRN

## 2021-03-20 MED ORDER — MANNITOL 25 % IV SOLN
INTRAVENOUS | Status: DC | PRN
  Administered 2021-03-20: 50 g via INTRAVENOUS

## 2021-03-20 MED ORDER — FENTANYL CITRATE (PF) 250 MCG/5ML IJ SOLN
INTRAMUSCULAR | Status: DC | PRN
  Administered 2021-03-20 (×7): 50 ug via INTRAVENOUS

## 2021-03-20 MED ORDER — ACETAMINOPHEN 325 MG PO TABS
650.0000 mg | ORAL_TABLET | ORAL | Status: DC | PRN
  Administered 2021-03-21: 650 mg via ORAL
  Filled 2021-03-20: qty 2

## 2021-03-20 MED ORDER — MIDAZOLAM HCL 2 MG/2ML IJ SOLN
INTRAMUSCULAR | Status: DC | PRN
  Administered 2021-03-20: 2 mg via INTRAVENOUS

## 2021-03-20 MED ORDER — CHLORHEXIDINE GLUCONATE CLOTH 2 % EX PADS
6.0000 | MEDICATED_PAD | Freq: Every day | CUTANEOUS | Status: DC
  Administered 2021-03-20 – 2021-03-21 (×2): 6 via TOPICAL

## 2021-03-20 MED ORDER — FENTANYL CITRATE (PF) 100 MCG/2ML IJ SOLN
INTRAMUSCULAR | Status: AC
  Filled 2021-03-20: qty 2

## 2021-03-20 MED ORDER — HEMOSTATIC AGENTS (NO CHARGE) OPTIME
TOPICAL | Status: DC | PRN
  Administered 2021-03-20: 1 via TOPICAL

## 2021-03-20 MED ORDER — BUTALBITAL-APAP-CAFFEINE 50-325-40 MG PO TABS: 1.0000 | ORAL_TABLET | ORAL | Status: DC | PRN

## 2021-03-20 MED ORDER — LIDOCAINE-EPINEPHRINE 1 %-1:100000 IJ SOLN
INTRAMUSCULAR | Status: DC | PRN
  Administered 2021-03-20: 4 mL

## 2021-03-20 MED ORDER — SODIUM CHLORIDE 0.9 % IV SOLN: INTRAVENOUS | Status: DC | PRN

## 2021-03-20 MED ORDER — HEPARIN SODIUM (PORCINE) 5000 UNIT/ML IJ SOLN: 5000.0000 [IU] | Freq: Three times a day (TID) | INTRAMUSCULAR | Status: DC

## 2021-03-20 MED ORDER — ROCURONIUM BROMIDE 10 MG/ML (PF) SYRINGE
PREFILLED_SYRINGE | INTRAVENOUS | Status: AC
  Filled 2021-03-20: qty 20

## 2021-03-20 MED ORDER — LABETALOL HCL 5 MG/ML IV SOLN: 10.0000 mg | INTRAVENOUS | Status: DC | PRN

## 2021-03-20 SURGICAL SUPPLY — 64 items
BAG COUNTER SPONGE SURGICOUNT (BAG) ×4 IMPLANT
BAND RUBBER #18 3X1/16 STRL (MISCELLANEOUS) ×4 IMPLANT
BENZOIN TINCTURE PRP APPL 2/3 (GAUZE/BANDAGES/DRESSINGS) IMPLANT
BLADE CLIPPER SURG (BLADE) ×4 IMPLANT
BLADE ULTRA TIP 2M (BLADE) IMPLANT
BUR ACORN 6.0 PRECISION (BURR) ×2 IMPLANT
BUR PRECISION FLUTE 5.0 (BURR) ×2 IMPLANT
CANISTER SUCT 3000ML PPV (MISCELLANEOUS) ×2 IMPLANT
CLIP VESOCCLUDE MED 6/CT (CLIP) IMPLANT
CNTNR URN SCR LID CUP LEK RST (MISCELLANEOUS) ×1 IMPLANT
CONT SPEC 4OZ STRL OR WHT (MISCELLANEOUS) ×1
COVER BACK TABLE 60X90IN (DRAPES) IMPLANT
DERMABOND ADVANCED (GAUZE/BANDAGES/DRESSINGS) ×1
DERMABOND ADVANCED .7 DNX12 (GAUZE/BANDAGES/DRESSINGS) ×1 IMPLANT
DRAPE LAPAROTOMY 100X72 PEDS (DRAPES) ×2 IMPLANT
DRAPE MICROSCOPE LEICA (MISCELLANEOUS) ×2 IMPLANT
DRAPE WARM FLUID 44X44 (DRAPES) ×2 IMPLANT
DURAPREP 6ML APPLICATOR 50/CS (WOUND CARE) ×2 IMPLANT
ELECT REM PT RETURN 9FT ADLT (ELECTROSURGICAL) ×2
ELECTRODE REM PT RTRN 9FT ADLT (ELECTROSURGICAL) ×1 IMPLANT
EVACUATOR 1/8 PVC DRAIN (DRAIN) IMPLANT
EVACUATOR SILICONE 100CC (DRAIN) IMPLANT
FORCEPS BIPOLAR SPETZLER 8 1.0 (NEUROSURGERY SUPPLIES) ×2 IMPLANT
GAUZE 4X4 16PLY ~~LOC~~+RFID DBL (SPONGE) IMPLANT
GAUZE SPONGE 4X4 12PLY STRL (GAUZE/BANDAGES/DRESSINGS) IMPLANT
GLOVE SURG LTX SZ7.5 (GLOVE) ×2 IMPLANT
GLOVE SURG LTX SZ8 (GLOVE) ×2 IMPLANT
GLOVE SURG UNDER POLY LF SZ7.5 (GLOVE) ×2 IMPLANT
GOWN STRL REUS W/ TWL LRG LVL3 (GOWN DISPOSABLE) ×2 IMPLANT
GOWN STRL REUS W/ TWL XL LVL3 (GOWN DISPOSABLE) ×1 IMPLANT
GOWN STRL REUS W/TWL 2XL LVL3 (GOWN DISPOSABLE) IMPLANT
GOWN STRL REUS W/TWL LRG LVL3 (GOWN DISPOSABLE) ×2
GOWN STRL REUS W/TWL XL LVL3 (GOWN DISPOSABLE) ×1
GRAFT DURAGEN MATRIX 2WX2L ×2 IMPLANT
HEMOSTAT POWDER KIT SURGIFOAM (HEMOSTASIS) ×2 IMPLANT
HEMOSTAT SURGICEL 2X14 (HEMOSTASIS) ×2 IMPLANT
HOOK DURA 1/2IN (MISCELLANEOUS) ×2 IMPLANT
KIT BASIN OR (CUSTOM PROCEDURE TRAY) ×2 IMPLANT
KIT TURNOVER KIT B (KITS) ×2 IMPLANT
MARKER SPHERE PSV REFLC 13MM (MARKER) ×4 IMPLANT
NEEDLE HYPO 25X1 1.5 SAFETY (NEEDLE) ×2 IMPLANT
NS IRRIG 1000ML POUR BTL (IV SOLUTION) ×4 IMPLANT
PACK CRANIOTOMY CUSTOM (CUSTOM PROCEDURE TRAY) ×2 IMPLANT
PAD ARMBOARD 7.5X6 YLW CONV (MISCELLANEOUS) IMPLANT
PATTIES SURGICAL .5 X1 (DISPOSABLE) IMPLANT
PATTIES SURGICAL 1/4 X 3 (GAUZE/BANDAGES/DRESSINGS) IMPLANT
SCREW UNIII AXS SD 1.5X4 (Screw) ×14 IMPLANT
SPONGE SURGIFOAM ABS GEL 100 (HEMOSTASIS) IMPLANT
SPONGE T-LAP 4X18 ~~LOC~~+RFID (SPONGE) IMPLANT
STAPLER SKIN PROX WIDE 3.9 (STAPLE) ×2 IMPLANT
SUT ETHILON 3 0 FSL (SUTURE) IMPLANT
SUT MON AB 3-0 SH 27 (SUTURE) ×1
SUT MON AB 3-0 SH27 (SUTURE) ×1 IMPLANT
SUT NURALON 4 0 TF (SUTURE) ×4 IMPLANT
SUT PROLENE 6 0 BV (SUTURE) IMPLANT
SUT VIC AB 0 CT1 18XCR BRD8 (SUTURE) ×1 IMPLANT
SUT VIC AB 0 CT1 8-18 (SUTURE) ×1
SUT VIC AB 2-0 CP2 18 (SUTURE) ×2 IMPLANT
TOWEL GREEN STERILE (TOWEL DISPOSABLE) ×2 IMPLANT
TOWEL GREEN STERILE FF (TOWEL DISPOSABLE) IMPLANT
TRAY FOLEY MTR SLVR 14FR STAT (SET/KITS/TRAYS/PACK) ×2 IMPLANT
TRAY FOLEY MTR SLVR 16FR STAT (SET/KITS/TRAYS/PACK) IMPLANT
UNDERPAD 30X36 HEAVY ABSORB (UNDERPADS AND DIAPERS) IMPLANT
WATER STERILE IRR 1000ML POUR (IV SOLUTION) ×2 IMPLANT

## 2021-03-20 NOTE — Op Note (Signed)
PATIENT: Scott Snyder  DAY OF SURGERY: 03/20/21   PRE-OPERATIVE DIAGNOSIS:  Brain tumor   POST-OPERATIVE DIAGNOSIS:  Same   PROCEDURE:  Repeat left craniotomy for tumor resection, use of frameless stereotaxy   SURGEON:  Surgeon(s) and Role:    Judith Part, MD - Primary    Kary Kos, MD - Assisting   ANESTHESIA: ETGA   BRIEF HISTORY: This is a 25 year old man who originally presented with ataxia and headaches, a left cerebellar tumor was discovered. He went to the OR for resection and clinically did well, but post-op MRI showed a likely residual. I therefore recommended resection of that residual, for which he presents today. This was discussed with the patient as well as risks, benefits, and alternatives and wished to proceed with surgery.   OPERATIVE DETAIL: The patient was taken to the operating room, anesthesia was induced by the anesthesia team, the [place] was applied, and the patient was placed on the OR table in the prone position. A formal time out was performed with two patient identifiers and confirmed the operative site. A registration array was attached to the [place]. This was co-registered with the patient's preoperative imaging, the fit appeared to be acceptable. Using frameless stereotaxy, the operative trajectory was planned and the incision was marked. Hair was clipped with surgical clippers over the incision and the area was then prepped and draped in a sterile fashion.  The patient's prior incision was opened, mesh was removed, dura was opened, landmarks checked with stereotaxy, and the residual component of the tumor was identified and resected. The radiographic abnormality was identified and followed to develop a plane. After getting to the tentorium superiorly, I was able to visualize the cerebellar surface and this appeared quite gliotic and grossly abnormal on the surface, so I followed it laterally and removed this tissue. The margins were again checked with  stereotaxy in all directions with good margins. Hemostasis was confirmed, the wound was copiously irrigated, the dura was closed, and the mesh was replaced with titanium screws.  All instrument and sponge counts were correct, the incision was then closed in layers. The patient was then returned to anesthesia for emergence. No apparent complications at the completion of the procedure.   EBL:  174mL   DRAINS: none   SPECIMENS: Left cerebellar tumor   Judith Part, MD 03/20/21 7:24 AM

## 2021-03-20 NOTE — Anesthesia Postprocedure Evaluation (Signed)
Anesthesia Post Note  Patient: Scientist, forensic  Procedure(s) Performed: REDO SUBOCCIPITAL CRANIECTOMY FOR BRAIN TUMOR APPLICATION OF CRANIAL NAVIGATION     Patient location during evaluation: PACU Anesthesia Type: General Level of consciousness: awake and alert Pain management: pain level controlled Vital Signs Assessment: post-procedure vital signs reviewed and stable Respiratory status: spontaneous breathing, nonlabored ventilation, respiratory function stable and patient connected to nasal cannula oxygen Cardiovascular status: blood pressure returned to baseline and stable Postop Assessment: no apparent nausea or vomiting Anesthetic complications: no   No notable events documented.  Last Vitals:  Vitals:   03/20/21 1417 03/20/21 1500  BP: (!) 141/76 138/77  Pulse: 98 89  Resp: 14 (!) 23  Temp:  (P) 36.7 C  SpO2: 97% 97%    Last Pain:  Vitals:   03/20/21 1417  TempSrc:   PainSc: 0-No pain                 Carmelia Tiner L Taft [place]

## 2021-03-20 NOTE — Anesthesia Procedure Notes (Signed)
Procedure Name: Intubation Date/Time: 03/20/2021 8:43 AM Performed by: Carolan Clines, CRNA Pre-anesthesia Checklist: Patient identified, Emergency Drugs available, Suction available and Patient being monitored Patient Re-evaluated:Patient Re-evaluated prior to induction Oxygen Delivery Method: Circle System Utilized Preoxygenation: Pre-oxygenation with 100% oxygen Induction Type: IV induction Ventilation: Mask ventilation without difficulty Laryngoscope Size: Mac and 4 Grade View: Grade I Tube type: Oral Tube size: 7.5 mm Number of attempts: 1 Airway Equipment and Method: Stylet Placement Confirmation: ETT inserted through vocal cords under direct vision, positive ETCO2 and breath sounds checked- equal and bilateral Secured at: 22 cm Tube secured with: Tape Dental Injury: Teeth and Oropharynx as per pre-operative assessment

## 2021-03-20 NOTE — Transfer of Care (Signed)
Immediate Anesthesia Transfer of Care Note  Patient: Scientist, forensic  Procedure(s) Performed: REDO SUBOCCIPITAL CRANIECTOMY FOR BRAIN TUMOR APPLICATION OF CRANIAL NAVIGATION  Patient Location: PACU  Anesthesia Type:General  Level of Consciousness: awake, alert  and oriented  Airway & Oxygen Therapy: Patient Spontanous Breathing  Post-op Assessment: Report given to RN and Post -op Vital signs reviewed and stable  Post vital signs: Reviewed and stable  Last Vitals:  Vitals Value Taken Time  BP 131/78 03/20/21 1150  Temp    Pulse 86 03/20/21 1151  Resp 15 03/20/21 1151  SpO2 98 % 03/20/21 1151  Vitals shown include unvalidated device data.  Last Pain:  Vitals:   03/20/21 0724  TempSrc:   PainSc: 0-No pain         Complications: No notable events documented.

## 2021-03-20 NOTE — H&P (Signed)
Surgical H&P Update  HPI: 25 y.o. man with h/o tumor s/p recent resection, now here for repeat resection due to residual. No changes in health since he was last seen, no new complaints.  PMHx:  Past Medical History:  Diagnosis Date   Headache    FamHx:  Family History  Family history unknown: Yes   SocHx:  reports that he has never smoked. He has never used smokeless tobacco. He reports current alcohol use. No history on file for drug use.  Physical Exam: AOx3, PERRL, FS, TM  Strength 5/5 x4, SILTx4  Assesment/Plan: 25 y.o. man with left cerebellar tumor s/p rsxn w/ residual, here for repeat resection. Risks, benefits, and alternatives discussed and the patient would like to continue with surgery.  -OR today -4N ICU post-op  Judith Part, MD 03/20/21 7:21 AM

## 2021-03-21 ENCOUNTER — Encounter (HOSPITAL_COMMUNITY): Payer: Self-pay | Admitting: Neurological Surgery

## 2021-03-21 NOTE — Progress Notes (Signed)
Neurosurgery Service Progress Note  Subjective: No acute events overnight, mild incisional pain, no new complaints, clinically doing very well   Objective: Vitals:   03/21/21 0400 03/21/21 0500 03/21/21 0600 03/21/21 0800  BP: 121/69 125/80 135/84 127/70  Pulse: 75 68 75 87  Resp: 14 14 18 15   Temp: 98.7 F (37.1 C)   98.3 F (36.8 C)  TempSrc: Oral   Axillary  SpO2: 97% 97% 97% 98%  Weight:      Height:        Physical Exam: AOx3, PERRL, EOMI, FS, TM, Strength 5/5 x4, SILTx4, incision c/d/i  Assessment & Plan: 25 y.o. man s/p repeat L cerebellar tumor resection, recovering well.  -MRI looks great, GTR, no hydrocephalus -okay to discharge home today  Judith Part  03/21/21 8:42 AM

## 2021-03-21 NOTE — Discharge Summary (Signed)
Discharge Summary  Date of Admission: 03/20/2021  Date of Discharge: 03/21/21  Attending Physician: Emelda Brothers, MD  Hospital Course: Patient was admitted following an uncomplicated repeat left craniotomy for resection of a cerebellar tumor. He was recovered in PACU and transferred to 4N. His hospital course was uncomplicated, post-op MRI showed a GTR, and the patient was discharged home on 03/21/21. He will follow up in clinic with me in 2 weeks.  Neurologic exam at discharge:  AOx3, PERRL, EOMI, FS, TM Strength 5/5 x4, SILTx4 Incision c/d/i  Discharge diagnosis: Brain tumor  Judith Part, MD 03/21/21 8:43 AM

## 2021-03-21 NOTE — Evaluation (Signed)
Occupational Therapy Evaluation Patient Details Name: Scott Snyder MRN: [id number] DOB: [birth year] Today's Date: 03/21/2021   History of Present Illness 24 y.o. man with left cerebellar tumor s/p rsxn w/ residual. Underwent craniotomy for resectoin of residual tumor 10/5.   Clinical Impression   Recently seen after first surgery. No apparent deficits after resection. Pt independent with functional mobility from ADL standpoint.Pt reports no complaints of dizziness and feeling off balance.Mom present and reports her son seems at baseline. Pt endorses feeling "a little weak" from not moving around as much. Pt with good support system. No further OT needed.      Recommendations for follow up therapy are one component of a multi-disciplinary discharge planning process, led by the attending physician.  Recommendations may be updated based on patient status, additional functional criteria and insurance authorization.   Follow Up Recommendations  No OT follow up    Equipment Recommendations  None recommended by OT    Recommendations for Other Services       Precautions / Restrictions Precautions Precautions: None Restrictions Weight Bearing Restrictions: No      Mobility Bed Mobility Overal bed mobility: Independent                  Transfers Overall transfer level: Independent                    Balance Overall balance assessment: No apparent balance deficits (not formally assessed)                                         ADL either performed or assessed with clinical judgement   ADL Overall ADL's : At baseline                                             Vision   Vision Assessment?: No apparent visual deficits     Perception Perception Perception Tested?: Yes (no deficits noted)   Praxis Praxis Praxis tested?: Within functional limits    Pertinent Vitals/Pain Pain Assessment: Faces Faces Pain Scale: Hurts a little  bit Pain Location: surgical soreness Pain Descriptors / Indicators:  (back of head) Pain Intervention(s): Limited activity within patient's tolerance     Hand Dominance Right   Extremity/Trunk Assessment Upper Extremity Assessment Upper Extremity Assessment: Overall WFL for tasks assessed   Lower Extremity Assessment Lower Extremity Assessment: Defer to PT evaluation   Cervical / Trunk Assessment Cervical / Trunk Assessment: Normal   Communication Communication Communication: No difficulties   Cognition Arousal/Alertness: Awake/alert Behavior During Therapy: WFL for tasks assessed/performed Overall Cognitive Status: Within Functional Limits for tasks assessed                                     General Comments       Exercises     Shoulder Instructions      Home Living Family/patient expects to be discharged to:: Private residence Living Arrangements: Spouse/significant other Available Help at Discharge: Family;Available 24 hours/day Type of Home: Apartment Home Access: Stairs to enter Entrance Stairs-Number of Steps: 3 Entrance Stairs-Rails: Right;Left Home Layout: One level     Bathroom Shower/Tub: Retail buyer Accessibility:  Yes How Accessible: Accessible via walker Home Equipment: None          Prior Functioning/Environment Level of Independence: Independent        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        OT Problem List: Pain      OT Treatment/Interventions:      OT Goals(Current goals can be found in the care plan section) Acute Rehab OT Goals Patient Stated Goal: to go home this afternoon OT Goal Formulation: All assessment and education complete, DC therapy  OT Frequency:     Barriers to D/C:            Co-evaluation              AM-PAC OT "6 Clicks" Daily Activity     Outcome Measure Help from another person eating meals?: None Help from another person taking care  of personal grooming?: None Help from another person toileting, which includes using toliet, bedpan, or urinal?: None Help from another person bathing (including washing, rinsing, drying)?: None Help from another person to put on and taking off regular upper body clothing?: None Help from another person to put on and taking off regular lower body clothing?: None 6 Click Score: 24   End of Session Nurse Communication: Mobility status;Other (comment) (DC needs)  Activity Tolerance: Patient tolerated treatment well Patient left: in chair;with call bell/phone within reach;with family/visitor present  OT Visit Diagnosis: Pain Pain - part of body:  (head)                Time: 9458-5929 OT Time Calculation (min): 20 min Charges:  OT General Charges $OT Visit: 1 Visit OT Evaluation $OT Eval Moderate Complexity: 1 Mod   Brentt Fread,HILLARY 03/21/2021, 10:07 AM Maurie Boettcher, OT/L   Acute OT Clinical Specialist Acute Rehabilitation Services Pager [phone] Office [phone]

## 2021-03-21 NOTE — Progress Notes (Signed)
Discharge instructions and packet given to patient. Peripheral IV removed. Patient pushed in wheelchair by RN down to lobby and discharged with his family.

## 2021-03-22 LAB — SURGICAL PATHOLOGY

## 2021-03-25 ENCOUNTER — Inpatient Hospital Stay: Payer: BLUE CROSS/BLUE SHIELD

## 2021-03-25 ENCOUNTER — Encounter (HOSPITAL_COMMUNITY): Payer: Self-pay

## 2021-03-25 ENCOUNTER — Telehealth: Payer: Self-pay | Admitting: Radiation Therapy

## 2021-03-25 NOTE — Telephone Encounter (Signed)
Spoke with Scott Snyder about his upcoming appointment with Dr. Mickeal Skinner on 04/04/21. I will wait until after this appointment to allow Dr. Mickeal Skinner an opportunity to do further workup if needed, before scheduling a radiation oncologist appointment.   Mont Dutton R.T.(R)(T) Radiation Special Procedures Navigator

## 2021-04-04 ENCOUNTER — Other Ambulatory Visit: Payer: Self-pay | Admitting: Radiation Therapy

## 2021-04-04 ENCOUNTER — Other Ambulatory Visit: Payer: Self-pay

## 2021-04-04 ENCOUNTER — Inpatient Hospital Stay (HOSPITAL_BASED_OUTPATIENT_CLINIC_OR_DEPARTMENT_OTHER): Payer: BLUE CROSS/BLUE SHIELD | Admitting: Internal Medicine

## 2021-04-04 VITALS — BP 134/83 | HR 70 | Temp 97.1°F | Resp 18 | Ht 75.0 in | Wt 211.0 lb

## 2021-04-04 DIAGNOSIS — C716 Malignant neoplasm of cerebellum: Secondary | ICD-10-CM

## 2021-04-04 NOTE — Progress Notes (Signed)
[place] at [place], [place] [address] [phone]   New Patient Evaluation  Date of Service: 04/04/21 Patient Name: Scott Snyder Patient MRN: [id number] Patient DOB: 05/12/1996 Provider: Ventura Sellers, MD  Identifying Statement:  Scott Snyder is a 25 y.o. male with posterior fossa  medulloblastoma, SHH+  who presents for initial consultation and evaluation.    Referring Provider: Judith Part, MD [place],  [place] [address]  Oncologic History: Oncology History  Medulloblastoma, adult [facility])  03/11/2021 Surgery   Sub-occipital craniotomy, resection by Dr. Zada Finders; path demonstrates medulloblastoma [facility]   03/20/2021 Surgery   Re-do craniotomy for residual tumor; result is gross total resection     Biomarkers:  [facility] mutant .  [id number] pending .  MYC pending  TERT Unknown   History of Present Illness: The patient's records from the referring physician were obtained and reviewed and the patient interviewed to confirm this HPI.  Scott Snyder presented to medical attention last month with several weeks history of new onset headache syndrome.  He described daily AM headaches, increasing severity over time, not responsive to headache medications.  In days prior to ED visit, he began to experience gait difficulty and imbalance.  CNS imaging demonstrated large posterior fossa mass, which was resected in 2 craniotomy sessions with Dr. Zada Finders (most recent 03/20/21).  Since surgery he feels considerably improved, no headaches, no balance issues.  He is functionally intact, living at home, no steroids.  He presents today for path review and treatment planning.  Medications: No current outpatient medications on file prior to visit.   No current facility-administered medications on file prior to visit.    Allergies: No Known Allergies Past Medical History:  Past Medical History:  Diagnosis Date   Headache     Past Surgical History:  Past Surgical History:  Procedure Laterality Date   APPLICATION OF CRANIAL NAVIGATION N/A 03/11/2021   Procedure: APPLICATION OF CRANIAL NAVIGATION;  Surgeon: Judith Part, MD;  Location: [place];  Service: Neurosurgery;  Laterality: N/A;   APPLICATION OF CRANIAL NAVIGATION N/A 03/20/2021   Procedure: APPLICATION OF CRANIAL NAVIGATION;  Surgeon: Judith Part, MD;  Location: [place];  Service: Neurosurgery;  Laterality: N/A;   SUBOCCIPITAL CRANIECTOMY CERVICAL LAMINECTOMY N/A 03/11/2021   Procedure: SUBOCCIPITAL CRANIECTOMY FOR TUMOR WITH VENTRICULAR DRAIN;  Surgeon: Judith Part, MD;  Location: [place];  Service: Neurosurgery;  Laterality: N/A;   SUBOCCIPITAL CRANIECTOMY CERVICAL LAMINECTOMY N/A 03/20/2021   Procedure: REDO SUBOCCIPITAL CRANIECTOMY FOR BRAIN TUMOR;  Surgeon: Judith Part, MD;  Location: [place];  Service: Neurosurgery;  Laterality: N/A;   Social History:  Social History   Socioeconomic History   Marital status: Single    Spouse name: Not on file   Number of children: Not on file   Years of education: Not on file   Highest education level: Not on file  Occupational History   Not on file  Tobacco Use   Smoking status: Never   Smokeless tobacco: Never  Vaping Use   Vaping Use: Never used  Substance and Sexual Activity   Alcohol use: Yes    Comment: socially   Drug use: Not on file   Sexual activity: Yes    Birth control/protection: Condom  Other Topics Concern   Not on file  Social History Narrative   Not on file   Social Determinants of Health   Financial Resource Strain: Not on file  Food Insecurity: Not on file  Transportation Needs: Not on file  Physical Activity: Not on file  Stress: Not on file  Social Connections: Not on file  Intimate Partner Violence: Not on file   Family History:  Family History  Family history unknown: Yes    Review of Systems: Constitutional: Doesn't report fevers, chills or  abnormal weight loss Eyes: Doesn't report blurriness of vision Ears, nose, mouth, throat, and face: Doesn't report sore throat Respiratory: Doesn't report cough, dyspnea or wheezes Cardiovascular: Doesn't report palpitation, chest discomfort  Gastrointestinal:  Doesn't report nausea, constipation, diarrhea GU: Doesn't report incontinence Skin: Doesn't report skin rashes Neurological: Per HPI Musculoskeletal: Doesn't report joint pain Behavioral/Psych: Doesn't report anxiety  Physical Exam: Vitals:   04/04/21 0901  BP: 134/83  Pulse: 70  Resp: 18  Temp: (!) 97.1 F (36.2 C)  SpO2: 98%   KPS: 90. General: Alert, cooperative, pleasant, in no acute distress Head: Normal EENT: No conjunctival injection or scleral icterus.  Lungs: Resp effort normal Cardiac: Regular rate Abdomen: Non-distended abdomen Skin: No rashes cyanosis or petechiae. Extremities: No clubbing or edema  Neurologic Exam: Mental Status: Awake, alert, attentive to examiner. Oriented to self and environment. Language is fluent with intact comprehension.  Cranial Nerves: Visual acuity is grossly normal. Visual fields are full. Extra-ocular movements intact. No ptosis. Face is symmetric Motor: Tone and bulk are normal. Power is full in both arms and legs. Reflexes are symmetric, no pathologic reflexes present.  Sensory: Intact to light touch Gait: Normal.   Labs: I have reviewed the data as listed    Component Value Date/Time   NA 137 03/11/2021 0846   K 4.0 03/11/2021 0846   CL 104 03/10/2021 0439   CO2 19 (L) 03/10/2021 0439   GLUCOSE 130 (H) 03/10/2021 0439   BUN 23 (H) 03/10/2021 0439   CREATININE 0.97 03/20/2021 1217   CALCIUM 10.0 03/10/2021 0439   PROT 7.3 03/07/2021 0420   ALBUMIN 4.5 03/10/2021 0439   AST 14 (L) 03/07/2021 0420   ALT 14 03/07/2021 0420   ALKPHOS 40 03/07/2021 0420   BILITOT 1.0 03/07/2021 0420   GFRNONAA >60 03/20/2021 1217   GFRAA >60 07/12/2017 0433   Lab Results   Component Value Date   WBC 19.0 (H) 03/20/2021   NEUTROABS 7.9 (H) 03/10/2021   HGB 12.1 (L) 03/20/2021   HCT 35.9 (L) 03/20/2021   MCV 90.4 03/20/2021   PLT 278 03/20/2021    Imaging:  CT HEAD WO CONTRAST (5MM)  Result Date: 03/08/2021 CLINICAL DATA:  Hydrocephalus. EXAM: CT HEAD WITHOUT CONTRAST TECHNIQUE: Contiguous axial images were obtained from the base of the skull through the vertex without intravenous contrast. COMPARISON:  Brain MRI 03/06/2021.  Head CT 03/06/2021. FINDINGS: Brain: Grossly unchanged mass centered within the left cerebellar hemisphere. On the prior brain MRI of 03/06/2021, this mass measured 5.4 x 3.6 x 3.5 cm. Unchanged severe posterior fossa mass effect with effacement of the fourth ventricle. As before, there is mass effect upon the brainstem and inferior cerebellar tonsillar extension below the level of the foramen magnum by 11 mm. Change, there is some degree of upward transtentorial herniation. As before, there is obstructive hydrocephalus with lateral and third ventriculomegaly and diffuse cerebral sulcal effacement. Lateral and third ventriculomegaly is similar to minimally increased as compared to the prior brain MRI of 03/06/2021. For instance, the transverse dimension of the anterior aspect of the third ventricle previously measured 10 mm, and now measures 11 mm. Not significantly  changed, there is mild transependymal flow of CSF. No evidence of acute intracranial hemorrhage. No acute demarcated cortical infarction. No extra-axial collection. No supratentorial midline shift. Vascular: No hyperdense vessel. Skull: Normal. Negative for fracture or focal lesion. Sinuses/Orbits: Visualized orbits show no acute finding. As before, there is near complete opacification of the right frontal sinus, extensive partial opacification of the anterior right ethmoid air cells and complete opacification of the right maxillary sinus (right ostiomeatal unit obstructive pattern. Small  right sphenoid sinus mucous retention cyst. Impression #2 will be called to the ordering clinician or representative by the Radiologist Assistant, and communication documented in the PACS or [employer]. IMPRESSION: Grossly unchanged large mass centered within the left cerebellar hemisphere. Similar to the prior brain MRI of 03/06/2021, there is severe posterior fossa mass effect with effacement of the fourth ventricle, mass effect upon the brainstem and inferior displacement of the cerebellar tonsils by 11 mm. There is also some degree of upward transtentorial herniation, also not significantly changed. Persistent obstructive hydrocephalus. Lateral and third ventriculomegaly is similar to slightly increased as compared to the prior MRI. For instance, the transverse dimension of the anterior third ventricle now measures 11 mm (previously 10 mm). Mild transependymal flow of CSF, unchanged. Persistent diffuse cerebral sulcal effacement. Severe right frontal, anterior ethmoid and maxillary sinus disease (right ostiomeatal unit obstructive pattern). Electronically Signed   By: Kellie Simmering D.O.   On: 03/08/2021 20:43   CT HEAD WO CONTRAST (5MM)  Result Date: 03/06/2021 CLINICAL DATA:  Headache, chronic, new features or increased frequency EXAM: CT HEAD WITHOUT CONTRAST TECHNIQUE: Contiguous axial images were obtained from the base of the skull through the vertex without intravenous contrast. COMPARISON:  None. FINDINGS: Brain: Probable mass in the left cerebellum, difficult to characterize on this noncontrast head CT but estimated to measure up to 4.9 x 4.2 cm on series 2, image 11. Extensive surrounding edema with marked mass effect on the surrounding cerebellum and brainstem. Approximately 9 mm of inferior cerebellar tonsillar herniation. The fourth ventricle and basal cisterns are effaced. Resulting obstructive hydrocephalus with enlargement of the lateral and fourth ventricles and diffuse sulcal effacement.  No evidence of acute large vascular territory infarct, acute hemorrhage, or extra-axial fluid collection. Vascular: No hyperdense vessel identified. Skull: No acute fracture. Sinuses/Orbits: Complete opacification of the right frontal sinus, anterior right ethmoid air cells, and imaged right maxillary sinus. Other: No mastoid effusions. IMPRESSION: 1. Probable mass in the left cerebellum with surrounding edema and severe mass effect on the adjacent cerebellum and brainstem. Effacement of the basal cisterns and 9 mm of inferior cerebellar tonsillar herniation. Recommend neurosurgical consultation and MRI with contrast. 2. Resulting obstructive hydrocephalus with diffuse sulcal effacement. Findings and recommendations discussed with Dr. Pearline Cables via telephone at 2:01 p.m. Electronically Signed   By: Margaretha Sheffield M.D.   On: 03/06/2021 14:09   CT Chest W Contrast  Result Date: 03/06/2021 CLINICAL DATA:  Cancer of unknown primary.  Brain cancer. EXAM: CT CHEST, ABDOMEN, AND PELVIS WITH CONTRAST TECHNIQUE: Multidetector CT imaging of the chest, abdomen and pelvis was performed following the standard protocol during bolus administration of intravenous contrast. CONTRAST:  41m OMNIPAQUE IOHEXOL 350 MG/ML SOLN COMPARISON:  None. FINDINGS: CT CHEST FINDINGS Cardiovascular: No acute vascular findings. Normal caliber thoracic aorta. No central pulmonary embolus to the lobar level on this non dedicated CTA exam. The heart is normal in size. No pericardial effusion or thickening. Mediastinum/Nodes: No enlarged mediastinal, hilar, or axillary lymph nodes. No esophageal wall  thickening. No thyroid nodule. Lungs/Pleura: Clear lungs. No pulmonary nodule or mass. No focal airspace disease. No pleural fluid or thickening. Trachea and central bronchi are patent. Musculoskeletal: No lytic or blastic osseous lesions. No acute osseous findings. No obvious intraspinal abnormality on CT. Unremarkable chest wall soft tissues. CT ABDOMEN  PELVIS FINDINGS Hepatobiliary: No focal hepatic lesion or abnormality. Normal liver size and appearance. Normal gallbladder. No biliary dilatation. Pancreas: Unremarkable. No pancreatic ductal dilatation or surrounding inflammatory changes. No pancreatic mass. Spleen: Splenic cleft.  Normal in size.  No focal lesion. Adrenals/Urinary Tract: No adrenal nodule. No focal renal abnormality or mass. Homogeneous renal enhancement. No hydronephrosis. Early excretion of IV contrast in the renal collecting systems, no obvious renal stone. Unremarkable urinary bladder. Layering high-density partially obscures assessment. Stomach/Bowel: Bowel assessment is limited in the absence of enteric contrast. Unremarkable appearance of the stomach. No small bowel mass, obstruction, or inflammation. Normal appendix. Small volume of colonic stool. No visualized colonic mass. Vascular/Lymphatic: Normal caliber abdominal aorta. Patent portal vein. No abdominal, retroperitoneal, mesenteric, or pelvic adenopathy. No enlarged inguinal nodes. Reproductive: Unremarkable prostate gland. Majority of the scrotum is not included in the field of view. There may be a calcification in the upper right hemiscrotum, partially included. Other: No ascites or free fluid. No free air. No omental thickening. Unremarkable body wall soft tissues. Musculoskeletal: No lytic or blastic osseous lesion. Tiny bone islands in the left femoral head. 1 No obvious intraspinal abnormality on CT. IMPRESSION: 1. No evidence of primary malignancy or metastatic disease in the chest, abdomen, or pelvis. 2. Majority of the scrotum is not included in the field of view. There may be a calcification in the upper right hemiscrotum, partially included. Consider scrotal ultrasound for testicular assessment. Electronically Signed   By: Keith Rake M.D.   On: 03/06/2021 19:19   MR BRAIN WO CONTRAST  Result Date: 03/21/2021 CLINICAL DATA:  Follow-up examination for repeat  craniotomy for tumor resection. EXAM: MRI HEAD WITHOUT CONTRAST TECHNIQUE: Multiplanar, multiecho pulse sequences of the brain and surrounding structures were obtained without intravenous contrast. COMPARISON:  MRI from 03/14/2021. FINDINGS: Brain: Postoperative changes from repeat left occipital craniotomy for tumor resection are seen. Scattered postoperative blood products seen within the resection cavity. Previously seen collection at the resection cavity is no longer visualized, likely a vacuo aided. Additionally, there has been interval resection of previously seen residual tumor about the resection cavity. No definite residual tumor now seen on this noncontrast examination. Improved edema within the left cerebellum with essentially resolved mass effect on the adjacent fourth ventricle. No visible complications. Interval removal of a right occipital shunt catheter. Residual blood products and/or pneumocephalus along the catheter tract. Overall ventricular size is relatively stable without hydrocephalus. Remainder the brain is otherwise stable with no other new abnormality. Vascular: Major intracranial vascular flow voids are maintained. Skull and upper cervical spine: Craniocervical junction normal. Bone marrow signal intensity normal. Sequelae of left occipital craniotomy. Postoperative collection within the suboccipital soft tissues measures 3.5 x 2.8 cm (series 3, image 2). Sinuses/Orbits: Globes and orbital soft tissues within normal limits. Right-sided paranasal sinus disease, similar to previous. Mastoid air cells remain clear. Other: None. IMPRESSION: 1. Postoperative changes from repeat left occipital craniotomy for tumor resection without complication. Improved edema within the left cerebellum with resolved mass effect on the fourth ventricle. 2. Interval removal of a right occipital shunt catheter. Stable ventricular size without hydrocephalus. Electronically Signed   By: Jeannine Boga M.D.    On:  03/21/2021 03:17   MR BRAIN WO CONTRAST  Result Date: 03/14/2021 CLINICAL DATA:  Left cerebellar mass resection 03/07/2021 EXAM: MRI HEAD WITHOUT CONTRAST TECHNIQUE: Multiplanar, multiecho pulse sequences of the brain and surrounding structures were obtained without intravenous contrast. COMPARISON:  MRI head 03/06/2021, 03/11/2021 FINDINGS: Brain: Postop resection of left cerebellar mass. Surgical cavity filled with fluid and blood measures 26 x 20 mm. Moderate edema surrounding the surgical cavity has improved. There remains mass-effect on the fourth ventricle which is effaced and displaced to the right. There is mass-effect on the aqueduct. Mild restricted diffusion lateral to the resection cavity otherwise no evidence of acute infarction. Right occipital shunt catheter enters the right lateral ventricle with decompression of the ventricles. Mild ventricular prominence with improvement since 03/11/2021. No midline shift. Small amount of hemorrhage along the shunt tract in the right occipital lobe. Vascular: Normal arterial flow voids Skull and upper cervical spine: Left suboccipital craniotomy. Sinuses/Orbits: Complete opacification right maxillary sinus unchanged. Mucosal edema throughout the paranasal sinuses most notably right frontal and ethmoid sinus. Negative orbit Other: None IMPRESSION: Postop resection of left cerebellar mass without complicating features. Improvement in edema surrounding the resection cavity Right occipital shunt catheter in the right lateral ventricle. Improvement in mild ventricular prominence compared with 03/11/2021. Electronically Signed   By: Franchot Gallo M.D.   On: 03/14/2021 13:39   MR BRAIN W WO CONTRAST  Result Date: 03/11/2021 CLINICAL DATA:  Status post tumor resection and EVD placement EXAM: MRI HEAD WITHOUT AND WITH CONTRAST TECHNIQUE: Multiplanar, multiecho pulse sequences of the brain and surrounding structures were obtained without and with intravenous  contrast. CONTRAST:  30m GADAVIST GADOBUTROL 1 MMOL/ML IV SOLN COMPARISON:  Brain MRI 03/06/2021 FINDINGS: Brain: Status post resection of left cerebellar mass. There is expected postoperative blood at the resection site. Hyperintense T2-weighted signal within the parenchyma surrounding the resection cavity. No abnormal contrast enhancement at the resection site; however, the primary tumor showed little to no contrast enhancement. There is a posterior right parietal approach extraventricular drainage catheter with its tip in the right lateral ventricle. Decreased size of the third and lateral ventricles since the prior study. Vascular: Major flow voids are preserved. Skull and upper cervical spine: Left posterior cranioplasty mesh. Sinuses/Orbits:Right ostiomeatal complex pattern chronic sinusitis, unchanged. Normal orbits. IMPRESSION: 1. Status post resection of left cerebellar mass with expected postoperative blood at the resection site. This will serve as a baseline for future studies. 2. Decreased size of the third and lateral ventricles following EVD placement. Electronically Signed   By: KUlyses JarredM.D.   On: 03/11/2021 23:20   MR BRAIN W WO CONTRAST  Result Date: 03/06/2021 CLINICAL DATA:  Headache with abnormal CT EXAM: MRI HEAD WITHOUT AND WITH CONTRAST TECHNIQUE: Multiplanar, multiecho pulse sequences of the brain and surrounding structures were obtained without and with intravenous contrast. CONTRAST:  925mGADAVIST GADOBUTROL 1 MMOL/ML IV SOLN COMPARISON:  CT same day FINDINGS: Brain: Arising in the left cerebellar hemisphere, there is a cystic and solid mass lesion measuring 5.4 x 3.6 x 3.5 cm with mass-effect upon the fourth ventricle and obstructive hydrocephalus of the lateral and third ventricles. The solid component measures 4 x 3.5 x 3 cm and occupies the majority of the lesion. The solid component shows relative restricted diffusion. There is low level enhancement in portions of the solid  component, but there is no intense enhancement. There is relatively little vasogenic edema within the left cerebellum. There is mass-effect upon the brainstem and there  is cerebellar tonsillar extension through the foramen magnum of 11 mm. Cerebral hemispheres are intrinsically normal. There is some subependymal resorption of CSF. Vascular: Major vessels at the base of the brain show flow. Skull and upper cervical spine: Negative Sinuses/Orbits: Inflammatory changes of the paranasal sinuses with complete opacification of the right maxillary, ethmoid and frontal sinuses. Other: None IMPRESSION: 5.4 x 3.6 x 3.5 cm mass lesion within the left cerebellar hemisphere, primarily solid but with cystic components, with mass-effect upon the fourth ventricle resulting in obstructive hydrocephalus of the lateral and third ventricles with subependymal resorption of CSF. Notably, the solid portions of the lesion show only low level enhancement. The lesion definitely enhances less than typical medulloblastoma or astrocytoma. I think the differential diagnosis would include a somewhat atypical medulloblastoma or astrocytoma, ependymoma, and atypical dysplastic cerebellar gangliocytoma (Lhermitte Duclos). Electronically Signed   By: Nelson Chimes M.D.   On: 03/06/2021 15:47   US SCROTUM  Result Date: 03/07/2021 CLINICAL DATA:  Cerebellar tumor, assess for malignancy. Questionable right scrotal calcification on CT. EXAM: ULTRASOUND OF SCROTUM TECHNIQUE: Complete ultrasound examination of the testicles, epididymis, and other scrotal structures was performed. COMPARISON:  Abdominopelvic CT earlier today. FINDINGS: Right testicle Measurements: 4.2 x 2.2 x 3.0 cm. Homogeneous echogenicity. Blood flow is noted. No mass or microlithiasis visualized. No definite scrotal calcification is seen. Left testicle Measurements: 4.0 x 2.0 x 2.6 cm. Homogeneous echogenicity. Blood flow is noted. No mass or microlithiasis visualized. Right  epididymis:  Normal in size and appearance. Left epididymis: Normal in size. Tiny incidental epididymal head cyst measures 3 mm. Hydrocele:  Trace bilaterally. Varicocele:  None visualized. IMPRESSION: Trace bilateral hydroceles. No evidence for testicular mass or other significant abnormality. Electronically Signed   By: Keith Rake M.D.   On: 03/07/2021 01:06   CT ABDOMEN PELVIS W CONTRAST  Result Date: 03/06/2021 CLINICAL DATA:  Cancer of unknown primary.  Brain cancer. EXAM: CT CHEST, ABDOMEN, AND PELVIS WITH CONTRAST TECHNIQUE: Multidetector CT imaging of the chest, abdomen and pelvis was performed following the standard protocol during bolus administration of intravenous contrast. CONTRAST:  57m OMNIPAQUE IOHEXOL 350 MG/ML SOLN COMPARISON:  None. FINDINGS: CT CHEST FINDINGS Cardiovascular: No acute vascular findings. Normal caliber thoracic aorta. No central pulmonary embolus to the lobar level on this non dedicated CTA exam. The heart is normal in size. No pericardial effusion or thickening. Mediastinum/Nodes: No enlarged mediastinal, hilar, or axillary lymph nodes. No esophageal wall thickening. No thyroid nodule. Lungs/Pleura: Clear lungs. No pulmonary nodule or mass. No focal airspace disease. No pleural fluid or thickening. Trachea and central bronchi are patent. Musculoskeletal: No lytic or blastic osseous lesions. No acute osseous findings. No obvious intraspinal abnormality on CT. Unremarkable chest wall soft tissues. CT ABDOMEN PELVIS FINDINGS Hepatobiliary: No focal hepatic lesion or abnormality. Normal liver size and appearance. Normal gallbladder. No biliary dilatation. Pancreas: Unremarkable. No pancreatic ductal dilatation or surrounding inflammatory changes. No pancreatic mass. Spleen: Splenic cleft.  Normal in size.  No focal lesion. Adrenals/Urinary Tract: No adrenal nodule. No focal renal abnormality or mass. Homogeneous renal enhancement. No hydronephrosis. Early excretion of IV  contrast in the renal collecting systems, no obvious renal stone. Unremarkable urinary bladder. Layering high-density partially obscures assessment. Stomach/Bowel: Bowel assessment is limited in the absence of enteric contrast. Unremarkable appearance of the stomach. No small bowel mass, obstruction, or inflammation. Normal appendix. Small volume of colonic stool. No visualized colonic mass. Vascular/Lymphatic: Normal caliber abdominal aorta. Patent portal vein. No abdominal, retroperitoneal, mesenteric, or  pelvic adenopathy. No enlarged inguinal nodes. Reproductive: Unremarkable prostate gland. Majority of the scrotum is not included in the field of view. There may be a calcification in the upper right hemiscrotum, partially included. Other: No ascites or free fluid. No free air. No omental thickening. Unremarkable body wall soft tissues. Musculoskeletal: No lytic or blastic osseous lesion. Tiny bone islands in the left femoral head. 1 No obvious intraspinal abnormality on CT. IMPRESSION: 1. No evidence of primary malignancy or metastatic disease in the chest, abdomen, or pelvis. 2. Majority of the scrotum is not included in the field of view. There may be a calcification in the upper right hemiscrotum, partially included. Consider scrotal ultrasound for testicular assessment. Electronically Signed   By: Keith Rake M.D.   On: 03/06/2021 19:19    Pathology: SURGICAL PATHOLOGY  CASE: [id number]  PATIENT: Scott Snyder  Surgical Pathology Report   Clinical History: cerebellar mass (cm)   FINAL MICROSCOPIC DIAGNOSIS:   A and B. 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 Lynnette Caffey  from [facility]:   "Tissue sections show an embryonal neoplasm with focal areas of  cell-cell wrapping and increased apoptosis and areas of nodularity  with  more fibrillar background.  Submitted immunostains show the neoplastic  cells are positive for synaptophysin, with some areas showing increased  labeling within the nodules.  GFAP and CD99 show patchy labeling of  neoplastic cells.  Reticulin staining shows increased reticulin  deposition in f areas, consistent with desmoplastic/nodular histology.  CD45 labels small admixed inflammatory cells.  Immunostains performed at  [facility] show the neoplastic cells are positive for INSM1, GAB1, and YAP1.  Beta-catenin is non-nuclear.  INI1 is retained.  P53 labels rare cells.  A ki-67 proliferation index is high.   The findings are those of an [place] H-activated medulloblastoma (WHO grade  4) with focal anaplasia.  Molecular studies are recommended, including  sequencing to exclude a TP53 mutation.  Molecular NG testing may be  performed for further characterization if clinically indicated and upon  written request of the treating physician."    Assessment/Plan Medulloblastoma, adult ([place]) - Plan: MR TOTAL SPINE METS SCREENING  We appreciate the opportunity to participate in the care of Scott Snyder.  He presents today with clinical, radiographic, pathologic syndrome consistent with [employer] mutant adult medulloblastoma.  We had an extensive conversation with him regarding pathology, prognosis, and available treatment pathways.    CSF analysis inpatient did not yield any malignant cells.  He is still pending MRI total spine for completion of staging.  If no drop mets identified, he will be considered 'medium risk' and stratify for craniospinal irradiation.  Case will be reviewed and discussed in detail during brain tumor board this week, but plan will likely consist of 24 Gy to neuraxis, with total 36 Gy to posterior fossa.    We additionally discussed role for chemotherapy in adult medulloblastoma; we will consider cisplatin+CCNU based regimen following completion of radiotherapy.  Screening for potential  clinical trials was performed and discussed using eligibility criteria for active protocols at [facility], loco-regional tertiary centers, as well as national database available on [email or web address].com.    The patient is not a candidate for a research protocol at this time due to no suitable study identified.   We spent twenty additional minutes teaching regarding the natural history,  biology, and historical experience in the treatment of brain tumors. We then discussed in detail the current recommendations for therapy focusing on the mode of administration, mechanism of action, anticipated toxicities, and quality of life issues associated with this plan. We also provided teaching sheets for the patient to take home as an additional resource.  Scott Snyder will return to clinic during radiation for further evaluation and treatment planning.  All questions were answered. The patient knows to call the clinic with any problems, questions or concerns. No barriers to learning were detected.  The total time spent in the encounter was 60 minutes and more than 50% was on counseling and review of test results   Ventura Sellers, MD Medical Director of Neuro-Oncology [facility] at [place] 04/04/21 2:00 PM

## 2021-04-10 NOTE — Progress Notes (Addendum)
Location/Histology of Brain Tumor: Medulloblastoma- Posterior Fossa  Patient presented to medical attention last month with complaints of several weeks history of new onset headaches.  He described daily morning headaches, increasing in severity over time, not responsive to headache medications.  He notes in the days prior to his ED visit, he began to experience gait difficulty and imbalance.  MRI Total Spine 04/17/2021:  PostOp MRI Brain 03/20/2021: Postoperative changes from repeat left occipital craniotomy for tumor resection are seen. Scattered postoperative blood products seen within the resection cavity. Previously seen collection at the resection cavity is no longer visualized, likely a vacuo aided.  Additionally, there has been interval resection of previously seen residual tumor about the resection cavity. No definite residual tumor now seen on this noncontrast examination. Improved edema within the left cerebellum with essentially resolved mass effect on the adjacent fourth ventricle. No visible complications.  PostOP MRI Brain 03/11/2021:Postop resection of left cerebellar mass. Surgical cavity filled with fluid and blood measures 26 x 20 mm. Moderate edema surrounding the surgical cavity has improved. There remains mass-effect on the fourth ventricle which is effaced and displaced to the right. There is mass-effect on the aqueduct. Mild restricted diffusion lateral to the resection cavity otherwise no evidence of acute infarction.  CT CAP 03/06/2021: No evidence of primary malignancy or metastatic disease in the chest, abdomen, or pelvis.  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   Surgical Pathology  Report  03/20/2021  03/11/2021    Past or anticipated interventions, if any, per neurosurgery:  Dr. Zada Finders -Redo Suboccipital craniectomy for brain tumor 03/20/2021 -Suboccipital craniectomy for tumor with ventricular drain 03/11/2021 -Follow-up unscheduled at this time.  Past or anticipated interventions, if any, per medical oncology:  Dr. Mickeal Skinner 04/04/2021 -CSF analysis inpatient did not yield any malignant cells.  He is still pending MRI total spine for completion of staging. -If no drop mets identified, he will be considered 'medium risk' and stratify for craniospinal irradiation.  Case will be reviewed and discussed in detail during brain tumor board this week, but plan will likely consist of 24 Gy to neuraxis, with total 36 Gy to posterior fossa. -We additionally discussed role for chemotherapy in adult medulloblastoma; we will consider cisplatin+CCNU based regimen following completion of radiotherapy. -[name] will return to clinic during radiation for further evaluation and treatment planning.   Dose of Decadron, if applicable: Completed therapy  Recent neurologic symptoms, if any:  Seizures: No Headaches: No Nausea: No Dizziness/ataxia: no Difficulty with hand coordination: No Focal numbness/weakness: No Visual deficits/changes: No Confusion/Memory deficits: No    SAFETY ISSUES: Prior radiation? no  Pacemaker/ICD? no Possible current pregnancy? N/a Is the patient on methotrexate? No  Additional Complaints / other details:

## 2021-04-11 ENCOUNTER — Ambulatory Visit
Admission: RE | Admit: 2021-04-11 | Discharge: 2021-04-11 | Disposition: A | Discharge status: Home / Self Care | Payer: BLUE CROSS/BLUE SHIELD | Source: Ambulatory Visit | Attending: Radiation Oncology | Admitting: Radiation Oncology

## 2021-04-11 ENCOUNTER — Other Ambulatory Visit: Payer: Self-pay

## 2021-04-11 ENCOUNTER — Other Ambulatory Visit: Payer: Self-pay | Admitting: Radiation Therapy

## 2021-04-11 ENCOUNTER — Telehealth: Payer: Self-pay | Admitting: Radiation Oncology

## 2021-04-11 ENCOUNTER — Other Ambulatory Visit (HOSPITAL_COMMUNITY): Payer: Self-pay

## 2021-04-11 ENCOUNTER — Encounter: Payer: Self-pay | Admitting: Radiation Oncology

## 2021-04-11 VITALS — BP 134/84 | HR 74 | Temp 97.6°F | Resp 18 | Ht 75.0 in | Wt 212.6 lb

## 2021-04-11 DIAGNOSIS — Z87891 Personal history of nicotine dependence: Secondary | ICD-10-CM | POA: Diagnosis not present

## 2021-04-11 DIAGNOSIS — C716 Malignant neoplasm of cerebellum: Secondary | ICD-10-CM | POA: Insufficient documentation

## 2021-04-11 DIAGNOSIS — R600 Localized edema: Secondary | ICD-10-CM | POA: Diagnosis not present

## 2021-04-11 MED ORDER — ONDANSETRON HCL 8 MG PO TABS
8.0000 mg | ORAL_TABLET | Freq: Three times a day (TID) | ORAL | 0 refills | Status: DC | PRN
  Filled 2021-04-11: qty 20, 7d supply, fill #0
  Filled 2021-04-26: qty 18, 6d supply, fill #0

## 2021-04-11 MED ORDER — MEMANTINE HCL 5 MG PO TABS
ORAL_TABLET | ORAL | 0 refills | Status: DC
  Filled 2021-04-11 – 2021-04-26 (×2): qty 70, 28d supply, fill #0

## 2021-04-11 MED ORDER — MEMANTINE HCL 10 MG PO TABS
10.0000 mg | ORAL_TABLET | Freq: Two times a day (BID) | ORAL | 4 refills | Status: DC
  Filled 2021-04-11 – 2021-05-20 (×2): qty 60, 30d supply, fill #0

## 2021-04-11 NOTE — Telephone Encounter (Signed)
I called the patient's mother to update her on our discussion from his consult appointment today.  We discussed the rationale the course and long and short-term side effects of radiotherapy.  She is very confident that he is interested in staying in town in [place] to receive all of his therapy.  We discussed their family history a bit as well and he has 4 family members and maternal great aunt, maternal great grandfather, a second maternal cousin and a third maternal cousin all of whom had thyroid cancers.  He has another second cousin who had breast cancer on his mother side of the family as well.  And both his older and younger brother were born with esophageal achalasia requiring Niesen fundoplication as infants.  Fortunately they have done well in the long-term.  His mother states that she has not had any known issues with her thyroid.  I let her know that I would talk to our genetic counseling team about this as well to see if they feel that he would benefit from any genetic testing.  She has our contact information should she have questions or concerns about his course of treatment.

## 2021-04-11 NOTE — Progress Notes (Signed)
Radiation Oncology         [phone]) [phone] ________________________________  Name: Scott Snyder        MRN: [id number]  Date of Service: 04/11/2021 DOB: [birth year]  NO:IBBCWUG, No Pcp Per (Inactive)  Vaslow, Acey Lav, MD     REFERRING PHYSICIAN: Ventura Sellers, MD   DIAGNOSIS: The encounter diagnosis was Medulloblastoma [facility]).   HISTORY OF PRESENT ILLNESS: Scott Snyder is a 25 y.o. male seen at the request of Dr. Mickeal Skinner and Dr. Zada Finders for a diagnosis of medulloblastoma of the left cerebellum. The patient was in his usual state of health until he started having nausea, progressive morning headaches, and proceeded to ED and was found on CT scan on 03/06/2021 with a CT of the head without contrast showed a 4.9 cm mass in the left cerebellum. MRI with and without contrast that day showed a 5.4 x 3.6 x 3.5 cm solid mass in the left cerebellar hemisphere with cystic component as well mass-effect upon the fourth ventricle and obstructive hydrocephalus of the lateral and third ventricles was noted solid component of the lesion measured 4 cm in greatest dimension and occupied the majority of it.  The solid component showed relative restricted diffusion and there was low-level enhancement and portions of the solid component with no intense enhancement.  There is relatively little vasogenic edema within the left cerebellum there is mass-effect upon the brainstem and there is cerebellar tonsillar extension through the foramen magnum of 11 mm.  Cerebellar hemispheres were intrinsically normal and some subependymal resorption of CSF.  CT of the chest abdomen and pelvis that day was negative for primary site of malignancy the majority of the scrotum was not included in the field-of-view and so ultrasound was performed the following day showing trace bilateral hydroceles without evidence of mass or other abnormalities.  He was taken to the operating room on 03/11/2021 for a suboccipital craniectomy with  ventricular drain and resection of tumor.  Final pathology revealed medulloblastoma SS H activated with focal anaplasia grade 4 his tumor was evaluated at [facility] as well cerebrospinal fluid on 03/12/2021 was sampled and showed no malignant cells.  He had postoperative imaging on 03/11/2021 showing status post resection of the left cerebellar mass with expected postoperative blood at the resection site and decreased size in the third and lateral ventricles following ventricular drain placement.  Repeat MRI without contrast on 03/14/2021 showed postop resection changes without complicating features and improvement in the edema surrounding the resection cavity but he did go back for reexcision on 03/20/2021 and residual medulloblastoma was seen, and the tumor measured up to 2 cm in aggregate fragments.  He has met with Dr. Mickeal Skinner and his case has been discussed in multiple meetings of the brain oncology conference.  Fortunately he has not needed steroids and has felt considerably better following his surgery.  Dr. Mickeal Skinner recommended MRI of the total spine, and if no metastatic disease were noted he would proceed with craniospinal radiation as well as adjuvant chemotherapy at the completion of radiation. He is seen today to discuss adjuvant radiotherapy.    PREVIOUS RADIATION THERAPY: No   PAST MEDICAL HISTORY:  Past Medical History:  Diagnosis Date   Headache    Medulloblastoma ([place]) 02/2021       PAST SURGICAL HISTORY: Past Surgical History:  Procedure Laterality Date   APPLICATION OF CRANIAL NAVIGATION N/A 03/11/2021   Procedure: APPLICATION OF CRANIAL NAVIGATION;  Surgeon: Judith Part, MD;  Location: [place];  Service:  Neurosurgery;  Laterality: N/A;   APPLICATION OF CRANIAL NAVIGATION N/A 03/20/2021   Procedure: APPLICATION OF CRANIAL NAVIGATION;  Surgeon: Judith Part, MD;  Location: [place];  Service: Neurosurgery;  Laterality: N/A;   SUBOCCIPITAL CRANIECTOMY CERVICAL LAMINECTOMY  N/A 03/11/2021   Procedure: SUBOCCIPITAL CRANIECTOMY FOR TUMOR WITH VENTRICULAR DRAIN;  Surgeon: Judith Part, MD;  Location: [place];  Service: Neurosurgery;  Laterality: N/A;   SUBOCCIPITAL CRANIECTOMY CERVICAL LAMINECTOMY N/A 03/20/2021   Procedure: REDO SUBOCCIPITAL CRANIECTOMY FOR BRAIN TUMOR;  Surgeon: Judith Part, MD;  Location: [place];  Service: Neurosurgery;  Laterality: N/A;     FAMILY HISTORY:  Family History  Family history unknown: Yes     SOCIAL HISTORY:  reports that he has never smoked. He has never used smokeless tobacco. He reports that he does not currently use alcohol. He reports current drug use. The patient is single but in a long term relationship with his girlfired [place]. He graduated from [employer] in 2020 where he played football. 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    ALLERGIES: Patient has no known allergies.   MEDICATIONS:  No current outpatient medications on file.   No current facility-administered medications for this encounter.     REVIEW OF SYSTEMS: On review of systems, the patient reports that he is doing very well since surgery and denies nausea, headaches, difficulty with balance or mobility or gait.  No other complaints are verbalized.     PHYSICAL EXAM:  Wt Readings from Last 3 Encounters:  04/11/21 212 lb 9.6 oz (96.4 kg)  04/04/21 211 lb (95.7 kg)  03/20/21 198 lb (89.8 kg)   Temp Readings from Last 3 Encounters:  04/11/21 97.6 F (36.4 C)  04/04/21 (!) 97.1 F (36.2 C) (Tympanic)  03/21/21 98.3 F (36.8 C) (Axillary)   BP Readings from Last 3 Encounters:  04/11/21 134/84  04/04/21 134/83  03/21/21 137/85   Pulse Readings from Last 3 Encounters:  04/11/21 74  04/04/21 70  03/21/21 87   Pain Assessment Pain Score: 0-No pain/10  In general this is a well appearing caucasian male in no acute distress. He's alert and oriented x4 and appropriate throughout the  examination. Cardiopulmonary assessment is negative for acute distress and he exhibits normal effort.     ECOG = 0  0 - Asymptomatic (Fully active, able to carry on all predisease activities without restriction)  1 - Symptomatic but completely ambulatory (Restricted in physically strenuous activity but ambulatory and able to carry out work of a light or sedentary nature. For example, light housework, office work)  2 - Symptomatic, <50% in bed during the day (Ambulatory and capable of all self care but unable to carry out any work activities. Up and about more than 50% of waking hours)  3 - Symptomatic, >50% in bed, but not bedbound (Capable of only limited self-care, confined to bed or chair 50% or more of waking hours)  4 - Bedbound (Completely disabled. Cannot carry on any self-care. Totally confined to bed or chair)  5 - Death   Eustace Pen MM, Creech RH, Tormey DC, et al. [phone]). "Toxicity and response criteria of the [facility]". [place] Oncol. 5 (6): 649-55    LABORATORY DATA:  Lab Results  Component Value Date   WBC 19.0 (H) 03/20/2021   HGB 12.1 (L) 03/20/2021   HCT 35.9 (L) 03/20/2021   MCV 90.4 03/20/2021   PLT 278 03/20/2021  Lab Results  Component Value Date   NA 137 03/11/2021   K 4.0 03/11/2021   CL 104 03/10/2021   CO2 19 (L) 03/10/2021   Lab Results  Component Value Date   ALT 14 03/07/2021   AST 14 (L) 03/07/2021   ALKPHOS 40 03/07/2021   BILITOT 1.0 03/07/2021      RADIOGRAPHY: MR BRAIN WO CONTRAST  Result Date: 03/21/2021 CLINICAL DATA:  Follow-up examination for repeat craniotomy for tumor resection. EXAM: MRI HEAD WITHOUT CONTRAST TECHNIQUE: Multiplanar, multiecho pulse sequences of the brain and surrounding structures were obtained without intravenous contrast. COMPARISON:  MRI from 03/14/2021. FINDINGS: Brain: Postoperative changes from repeat left occipital craniotomy for tumor resection are seen. Scattered postoperative  blood products seen within the resection cavity. Previously seen collection at the resection cavity is no longer visualized, likely a vacuo aided. Additionally, there has been interval resection of previously seen residual tumor about the resection cavity. No definite residual tumor now seen on this noncontrast examination. Improved edema within the left cerebellum with essentially resolved mass effect on the adjacent fourth ventricle. No visible complications. Interval removal of a right occipital shunt catheter. Residual blood products and/or pneumocephalus along the catheter tract. Overall ventricular size is relatively stable without hydrocephalus. Remainder the brain is otherwise stable with no other new abnormality. Vascular: Major intracranial vascular flow voids are maintained. Skull and upper cervical spine: Craniocervical junction normal. Bone marrow signal intensity normal. Sequelae of left occipital craniotomy. Postoperative collection within the suboccipital soft tissues measures 3.5 x 2.8 cm (series 3, image 2). Sinuses/Orbits: Globes and orbital soft tissues within normal limits. Right-sided paranasal sinus disease, similar to previous. Mastoid air cells remain clear. Other: None. IMPRESSION: 1. Postoperative changes from repeat left occipital craniotomy for tumor resection without complication. Improved edema within the left cerebellum with resolved mass effect on the fourth ventricle. 2. Interval removal of a right occipital shunt catheter. Stable ventricular size without hydrocephalus. Electronically Signed   By: Jeannine Boga M.D.   On: 03/21/2021 03:17   MR BRAIN WO CONTRAST  Result Date: 03/14/2021 CLINICAL DATA:  Left cerebellar mass resection 03/07/2021 EXAM: MRI HEAD WITHOUT CONTRAST TECHNIQUE: Multiplanar, multiecho pulse sequences of the brain and surrounding structures were obtained without intravenous contrast. COMPARISON:  MRI head 03/06/2021, 03/11/2021 FINDINGS: Brain:  Postop resection of left cerebellar mass. Surgical cavity filled with fluid and blood measures 26 x 20 mm. Moderate edema surrounding the surgical cavity has improved. There remains mass-effect on the fourth ventricle which is effaced and displaced to the right. There is mass-effect on the aqueduct. Mild restricted diffusion lateral to the resection cavity otherwise no evidence of acute infarction. Right occipital shunt catheter enters the right lateral ventricle with decompression of the ventricles. Mild ventricular prominence with improvement since 03/11/2021. No midline shift. Small amount of hemorrhage along the shunt tract in the right occipital lobe. Vascular: Normal arterial flow voids Skull and upper cervical spine: Left suboccipital craniotomy. Sinuses/Orbits: Complete opacification right maxillary sinus unchanged. Mucosal edema throughout the paranasal sinuses most notably right frontal and ethmoid sinus. Negative orbit Other: None IMPRESSION: Postop resection of left cerebellar mass without complicating features. Improvement in edema surrounding the resection cavity Right occipital shunt catheter in the right lateral ventricle. Improvement in mild ventricular prominence compared with 03/11/2021. Electronically Signed   By: Franchot Gallo M.D.   On: 03/14/2021 13:39       IMPRESSION/PLAN: 1. Medulloblastoma of the left cerebellum.  Dr. Lisbeth Renshaw discusses the findings from imaging as  well as reviews his postoperative course and pathologic results.  He reviews the discussion that is been had in multidisciplinary brain and spine oncology conference.  He is in agreement with proceeding with an MRI to rule out metastatic lesions along the spinal cord.  If he does not have metastatic disease, he would be a candidate for craniospinal radiation.  We discussed the risks, benefits, short and long-term effects of radiotherapy.  We did discuss options of being treated in a proton center as it relates to NCCN  guidelines and radiation approach.  While that is an option, and we discussed pros and cons of the differences in style and approach to radiotherapy, the patient is interested in remaining in [place] for his treatment. Written consent is obtained and placed in the chart, a copy was provided to the patient.  He will return next Tuesday for simulation and subsequently will plan a course of his therapy but we anticipate starting 04/24/2021.  He will follow-up with Dr. Zada Finders for staple removal. 2. Possible long-term risks of cognitive deficits secondary to radiotherapy.  We discussed while this would be considered off label utilization of Namenda, that fork other patients who receive whole brain radiation therapy for metastatic brain disease, that Namenda has been studied specifically in the small cell lung cancer population as a means of trying to preserve cognitive ability.  The patient is interested in preservation if possible.  We will send in a prescription of Namenda and discussed the titration and continuation of this medication for a total of 6 months time.  He is in agreement with this plan.  In a visit lasting 60 minutes, greater than 50% of the time was spent face to face discussing the patient's condition, in preparation for the discussion, and coordinating the patient's care.   The above documentation reflects my direct findings during this shared patient visit. Please see the separate note by Dr. Lisbeth Renshaw on this date for the remainder of the patient's plan of care.    Carola Rhine, [facility]   **Disclaimer: This note was dictated with voice recognition software. Similar sounding words can inadvertently be transcribed and this note may contain transcription errors which may not have been corrected upon publication of note.**

## 2021-04-16 ENCOUNTER — Ambulatory Visit
Admission: RE | Admit: 2021-04-16 | Payer: BLUE CROSS/BLUE SHIELD | Source: Ambulatory Visit | Admitting: Radiation Oncology

## 2021-04-16 ENCOUNTER — Other Ambulatory Visit: Payer: Self-pay

## 2021-04-17 ENCOUNTER — Ambulatory Visit (HOSPITAL_COMMUNITY)
Admission: RE | Admit: 2021-04-17 | Discharge: 2021-04-17 | Disposition: A | Discharge status: Home / Self Care | Payer: BLUE CROSS/BLUE SHIELD | Source: Ambulatory Visit | Attending: Internal Medicine | Admitting: Internal Medicine

## 2021-04-17 DIAGNOSIS — C716 Malignant neoplasm of cerebellum: Secondary | ICD-10-CM | POA: Diagnosis not present

## 2021-04-17 IMAGING — MR MR TOTAL SPINE METS SCREENING
11 of 14 series · 37 of 48 positions shown · IV contrast (gadavist)
Comparison: None.

CLINICAL DATA: Brain/CNS neoplasm, staging

EXAM:
MRI TOTAL SPINE WITHOUT AND WITH CONTRAST
TECHNIQUE: Multisequence MR imaging of the spine from the cervical spine to the
sacrum was performed prior to and following IV contrast
administration for evaluation of spinal metastatic disease.
CONTRAST:  9mL GADAVIST GADOBUTROL 1 MMOL/ML IV SOLN

[Series 16: T1 · sagittal · 4.0mm · 1.20mm/px · 1 of 10 slices shown (1 of 4)]
[im 1/10]
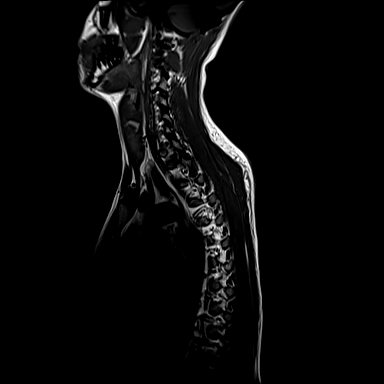

[Series 17: T1 · sagittal · 4.0mm · 1.19mm/px · 4 of 15 slices shown (2 of 4)]
[im 1/15]
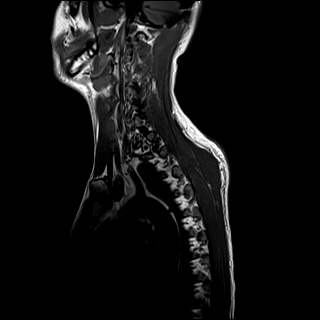
[im 5/15]
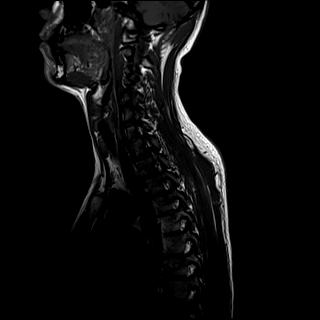
[im 10/15]
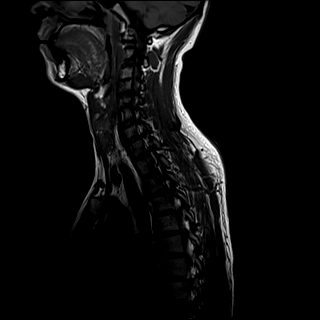
[im 15/15]
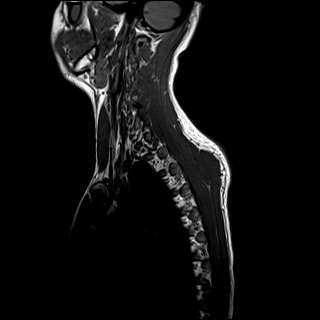

[Series 18: T1 · sagittal · 4.0mm · 1.22mm/px · 4 of 16 slices shown (3 of 4)]
[im 1/16]
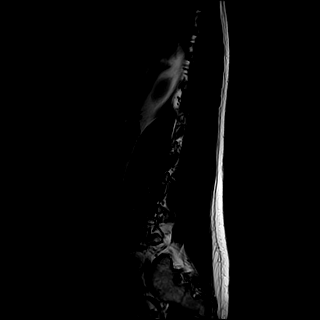
[im 6/16]
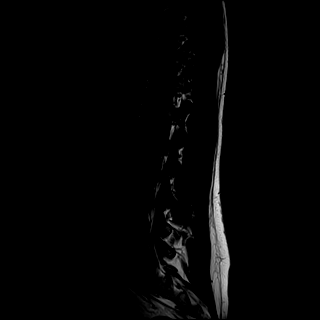
[im 11/16]
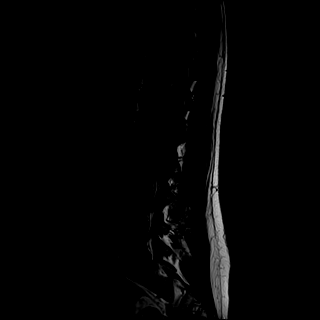
[im 16/16]
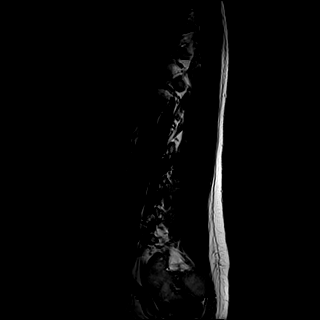

[Series 20: T1 · sagittal · 4.0mm · 1.19mm/px · 3 of 14 slices shown (4 of 4)]
[im 1/14]
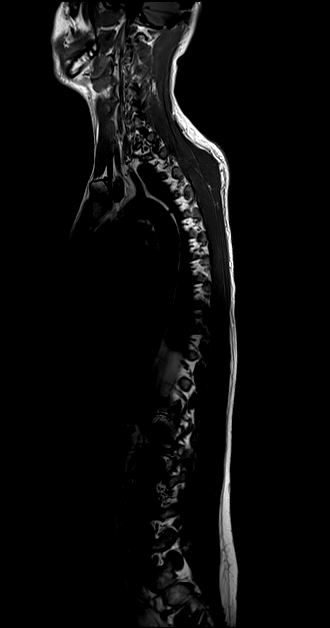
[im 7/14]
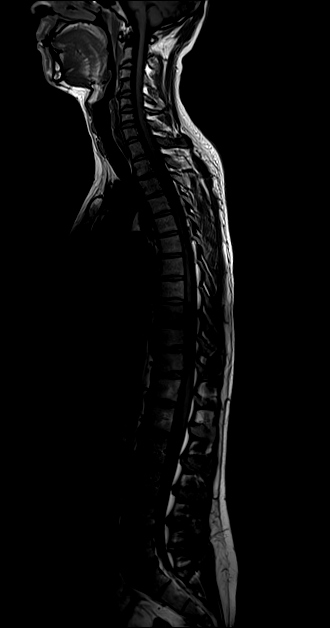
[im 14/14]
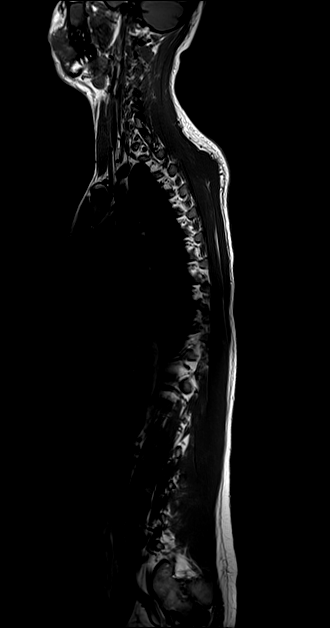

[Series 21: STIR · sagittal · 4.0mm · 1.19mm/px · 3 of 15 slices shown]
[im 1/15]
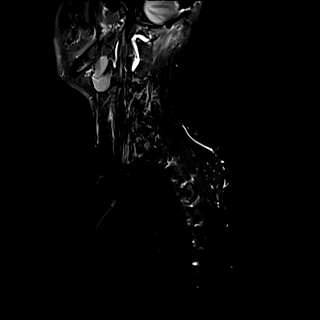
[im 5/15]
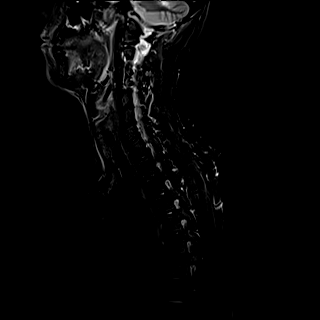
[im 10/15]
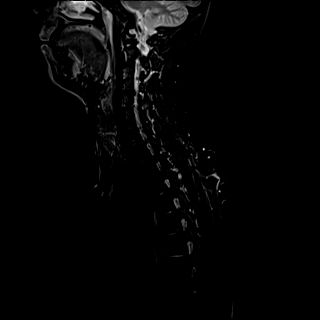

[Series 25: T1 fat-sat post-contrast · sagittal · 4.0mm · 1.19mm/px · 4 of 15 slices shown (1 of 2)]
[im 1/15]
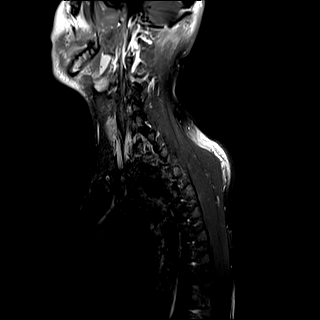
[im 5/15]
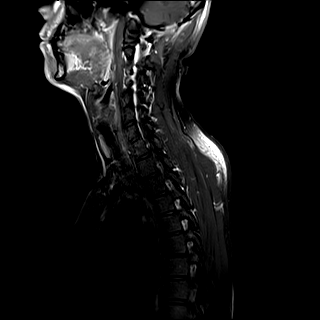
[im 10/15]
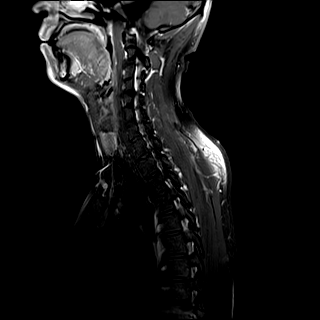
[im 15/15]
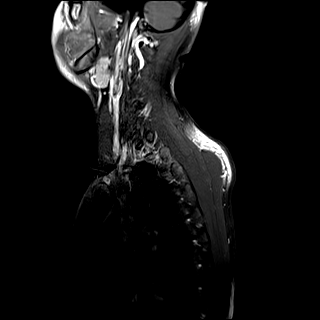

[Series 26: T1 fat-sat post-contrast · sagittal · 4.0mm · 1.22mm/px · 4 of 16 slices shown (2 of 2)]
[im 1/16]
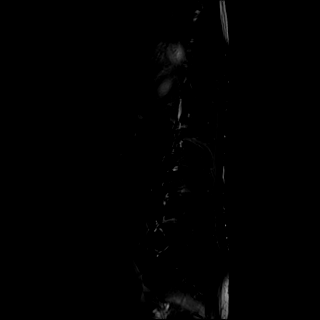
[im 6/16]
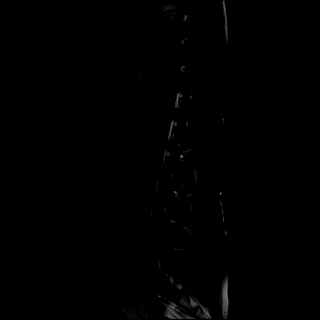
[im 11/16]
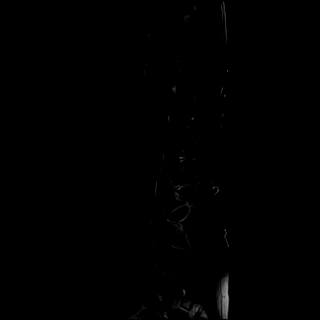
[im 16/16]
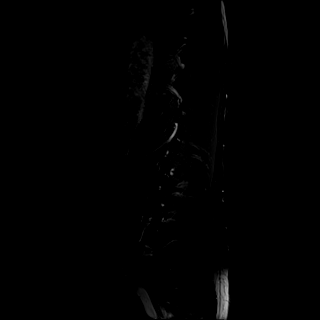

[Series 28: T1 fat-sat · sagittal · 4.0mm · 1.19mm/px · 3 of 13 slices shown]
[im 1/13]
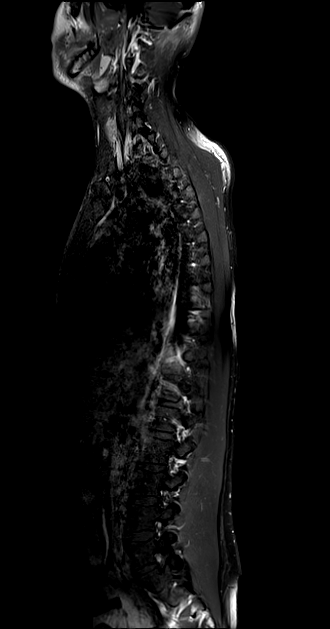
[im 7/13]
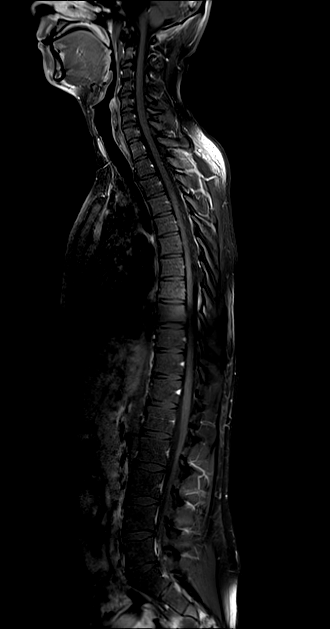
[im 13/13]
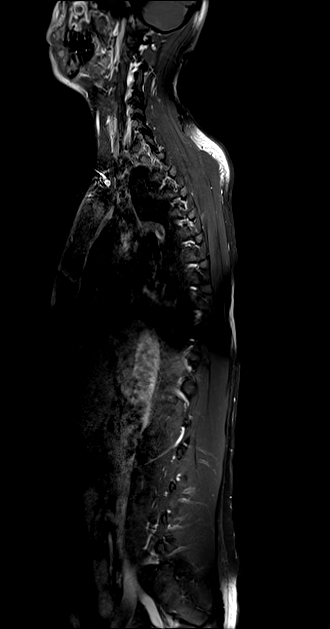

[Series 29: T2 post-contrast · sagittal · 4.0mm · 1.19mm/px · 4 of 15 slices shown (1 of 3)]
[im 1/15]
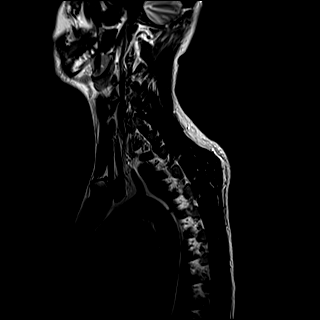
[im 5/15]
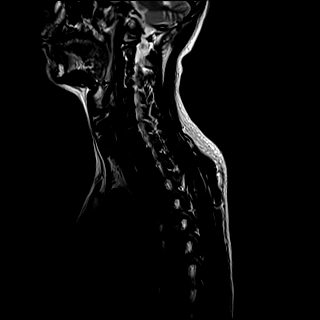
[im 10/15]
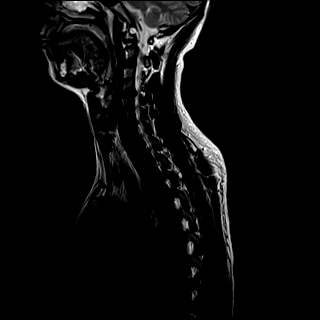
[im 15/15]
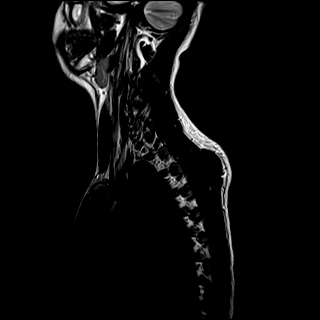

[Series 30: T2 post-contrast · sagittal · 4.0mm · 1.22mm/px · 4 of 16 slices shown (2 of 3)]
[im 1/16]
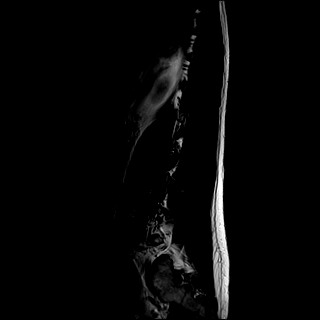
[im 6/16]
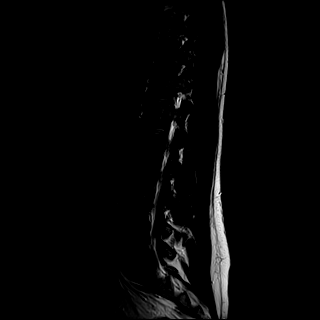
[im 11/16]
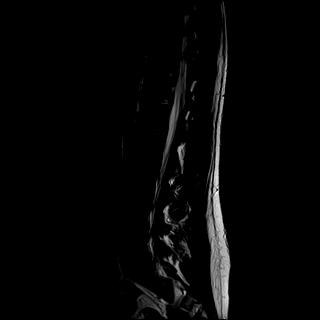
[im 16/16]
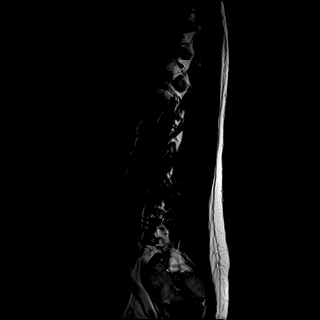

[Series 32: T2 post-contrast · sagittal · 4.0mm · 1.19mm/px · 3 of 13 slices shown (3 of 3)]
[im 1/13]
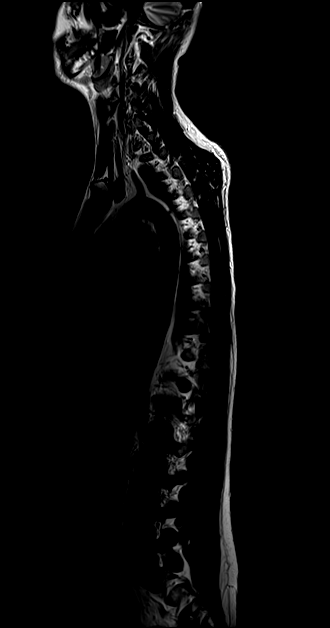
[im 7/13]
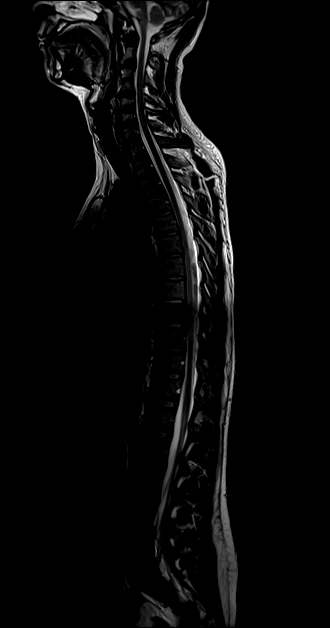
[im 13/13]
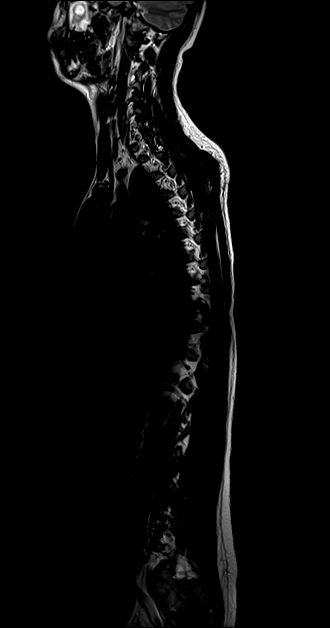

[37 of 48 positions shown; findings below may reference images not displayed]

FINDINGS: MRI CERVICAL SPINE FINDINGS

Alignment: Physiologic.

Vertebrae: Vertebral body heights are maintained. No marrow edema.
No suspicious osseous lesion. There is congenital fusion of C3 and
C4.

Cord: No abnormal signal.  No abnormal intrathecal enhancement.

Posterior Fossa, vertebral arteries, paraspinal tissues:
Incompletely evaluated intracranial postoperative changes.

Disc levels: Intervertebral disc heights and signal are maintained.

MRI THORACIC SPINE FINDINGS

Alignment:  Physiologic.

Vertebrae: Vertebral body heights are maintained. No marrow edema.
No suspicious osseous lesion.

Cord:  No abnormal signal.  No abnormal intrathecal enhancement.

Paraspinal and other soft tissues: Unremarkable.

Disc levels: Intervertebral disc heights and signal are maintained.

MRI LUMBAR SPINE FINDINGS

Segmentation:  Standard.

Alignment:  Physiologic.

Vertebrae: Vertebral body heights maintained. No marrow edema. No
suspicious osseous lesion.

Conus medullaris: Extends to the L1 level and appears normal. No
abnormal intrathecal enhancement.

Paraspinal and other soft tissues: Unremarkable.

Disc levels: Intervertebral disc heights and signal are maintained.
IMPRESSION: No evidence of leptomeningeal disease.

## 2021-04-17 MED ORDER — GADOBUTROL 1 MMOL/ML IV SOLN
9.0000 mL | Freq: Once | INTRAVENOUS | Status: AC | PRN
  Administered 2021-04-17: 9 mL via INTRAVENOUS

## 2021-04-18 ENCOUNTER — Ambulatory Visit: Payer: BLUE CROSS/BLUE SHIELD | Admitting: Radiation Oncology

## 2021-04-18 ENCOUNTER — Ambulatory Visit
Admission: RE | Admit: 2021-04-18 | Discharge: 2021-04-18 | Disposition: A | Discharge status: Home / Self Care | Payer: BLUE CROSS/BLUE SHIELD | Source: Ambulatory Visit | Attending: Radiation Oncology | Admitting: Radiation Oncology

## 2021-04-18 ENCOUNTER — Other Ambulatory Visit: Payer: Self-pay

## 2021-04-18 ENCOUNTER — Telehealth: Payer: Self-pay | Admitting: Radiation Oncology

## 2021-04-18 DIAGNOSIS — C716 Malignant neoplasm of cerebellum: Secondary | ICD-10-CM | POA: Diagnosis present

## 2021-04-18 NOTE — Telephone Encounter (Signed)
I called and spoke with the patient and his girlfriend to follow-up on a discussion I had previously with his mom about genetic testing. Our in-house geneticist has said that genetics was not recommended unless the thyroid cancer history was medullary in nature or if there were significant histories of basal cell carcinoma cysts of the jaw or colon polyps.  Our genetics team did say that she was happy to discuss his history and to consider testing if they desire this. He will let us know if he wants testing but at this point will just move forward with radiation.

## 2021-04-19 ENCOUNTER — Ambulatory Visit: Payer: BLUE CROSS/BLUE SHIELD | Admitting: Radiation Oncology

## 2021-04-22 ENCOUNTER — Other Ambulatory Visit (HOSPITAL_COMMUNITY): Payer: Self-pay

## 2021-04-22 ENCOUNTER — Inpatient Hospital Stay: Payer: BLUE CROSS/BLUE SHIELD | Attending: Internal Medicine

## 2021-04-22 ENCOUNTER — Ambulatory Visit: Payer: BLUE CROSS/BLUE SHIELD | Admitting: Radiation Oncology

## 2021-04-23 ENCOUNTER — Ambulatory Visit: Payer: BLUE CROSS/BLUE SHIELD | Admitting: Radiation Oncology

## 2021-04-23 DIAGNOSIS — C716 Malignant neoplasm of cerebellum: Secondary | ICD-10-CM | POA: Diagnosis not present

## 2021-04-24 ENCOUNTER — Ambulatory Visit: Payer: BLUE CROSS/BLUE SHIELD | Admitting: Radiation Oncology

## 2021-04-24 ENCOUNTER — Ambulatory Visit
Admission: RE | Admit: 2021-04-24 | Discharge: 2021-04-24 | Disposition: A | Discharge status: Home / Self Care | Payer: BLUE CROSS/BLUE SHIELD | Source: Ambulatory Visit | Attending: Radiation Oncology | Admitting: Radiation Oncology

## 2021-04-24 ENCOUNTER — Other Ambulatory Visit: Payer: Self-pay

## 2021-04-24 DIAGNOSIS — C716 Malignant neoplasm of cerebellum: Secondary | ICD-10-CM | POA: Diagnosis not present

## 2021-04-25 ENCOUNTER — Ambulatory Visit
Admission: RE | Admit: 2021-04-25 | Discharge: 2021-04-25 | Disposition: A | Discharge status: Home / Self Care | Payer: BLUE CROSS/BLUE SHIELD | Source: Ambulatory Visit | Attending: Radiation Oncology | Admitting: Radiation Oncology

## 2021-04-25 ENCOUNTER — Ambulatory Visit: Payer: BLUE CROSS/BLUE SHIELD | Admitting: Radiation Oncology

## 2021-04-25 DIAGNOSIS — C716 Malignant neoplasm of cerebellum: Secondary | ICD-10-CM | POA: Diagnosis not present

## 2021-04-26 ENCOUNTER — Ambulatory Visit: Payer: BLUE CROSS/BLUE SHIELD | Admitting: Radiation Oncology

## 2021-04-26 ENCOUNTER — Other Ambulatory Visit: Payer: Self-pay

## 2021-04-26 ENCOUNTER — Ambulatory Visit
Admission: RE | Admit: 2021-04-26 | Discharge: 2021-04-26 | Disposition: A | Discharge status: Home / Self Care | Payer: BLUE CROSS/BLUE SHIELD | Source: Ambulatory Visit | Attending: Radiation Oncology | Admitting: Radiation Oncology

## 2021-04-26 ENCOUNTER — Other Ambulatory Visit (HOSPITAL_COMMUNITY): Payer: Self-pay

## 2021-04-26 DIAGNOSIS — C716 Malignant neoplasm of cerebellum: Secondary | ICD-10-CM

## 2021-04-26 MED ORDER — SONAFINE EX EMUL
1.0000 "application " | Freq: Once | CUTANEOUS | Status: AC
  Administered 2021-04-26: 1 via TOPICAL

## 2021-04-26 NOTE — Progress Notes (Signed)
Pt here for patient teaching.  Pt given Radiation and You booklet, skin care instructions, and Sonafine.  Reviewed areas of pertinence such as fatigue, hair loss, nausea and vomiting, skin changes, headache, and blurry vision . Pt able to give teach back of to pat skin, use unscented/gentle soap, and drink plenty of water,apply Sonafine bid and avoid applying anything to skin within 4 hours of treatment. Pt verbalizes understanding of information given and will contact nursing with any questions or concerns.     Http://rtanswers.org/treatmentinformation/whattoexpect/index  Gloriajean Dell. Leonie Green, BSN

## 2021-04-29 ENCOUNTER — Ambulatory Visit
Admission: RE | Admit: 2021-04-29 | Discharge: 2021-04-29 | Disposition: A | Discharge status: Home / Self Care | Payer: BLUE CROSS/BLUE SHIELD | Source: Ambulatory Visit | Attending: Radiation Oncology | Admitting: Radiation Oncology

## 2021-04-29 ENCOUNTER — Other Ambulatory Visit: Payer: Self-pay

## 2021-04-29 ENCOUNTER — Ambulatory Visit: Payer: BLUE CROSS/BLUE SHIELD | Admitting: Radiation Oncology

## 2021-04-29 DIAGNOSIS — C716 Malignant neoplasm of cerebellum: Secondary | ICD-10-CM | POA: Diagnosis not present

## 2021-04-30 ENCOUNTER — Ambulatory Visit: Payer: BLUE CROSS/BLUE SHIELD | Admitting: Radiation Oncology

## 2021-04-30 ENCOUNTER — Ambulatory Visit
Admission: RE | Admit: 2021-04-30 | Discharge: 2021-04-30 | Disposition: A | Discharge status: Home / Self Care | Payer: BLUE CROSS/BLUE SHIELD | Source: Ambulatory Visit | Attending: Radiation Oncology | Admitting: Radiation Oncology

## 2021-04-30 DIAGNOSIS — C716 Malignant neoplasm of cerebellum: Secondary | ICD-10-CM | POA: Diagnosis not present

## 2021-05-01 ENCOUNTER — Ambulatory Visit: Payer: BLUE CROSS/BLUE SHIELD | Admitting: Radiation Oncology

## 2021-05-01 ENCOUNTER — Ambulatory Visit
Admission: RE | Admit: 2021-05-01 | Discharge: 2021-05-01 | Disposition: A | Discharge status: Home / Self Care | Payer: BLUE CROSS/BLUE SHIELD | Source: Ambulatory Visit | Attending: Radiation Oncology | Admitting: Radiation Oncology

## 2021-05-01 ENCOUNTER — Other Ambulatory Visit: Payer: Self-pay

## 2021-05-01 DIAGNOSIS — C716 Malignant neoplasm of cerebellum: Secondary | ICD-10-CM | POA: Diagnosis not present

## 2021-05-02 ENCOUNTER — Ambulatory Visit: Payer: BLUE CROSS/BLUE SHIELD | Admitting: Radiation Oncology

## 2021-05-02 ENCOUNTER — Ambulatory Visit
Admission: RE | Admit: 2021-05-02 | Discharge: 2021-05-02 | Disposition: A | Discharge status: Home / Self Care | Payer: BLUE CROSS/BLUE SHIELD | Source: Ambulatory Visit | Attending: Radiation Oncology | Admitting: Radiation Oncology

## 2021-05-02 ENCOUNTER — Other Ambulatory Visit: Payer: Self-pay

## 2021-05-02 DIAGNOSIS — C716 Malignant neoplasm of cerebellum: Secondary | ICD-10-CM | POA: Diagnosis not present

## 2021-05-03 ENCOUNTER — Ambulatory Visit
Admission: RE | Admit: 2021-05-03 | Discharge: 2021-05-03 | Disposition: A | Discharge status: Home / Self Care | Payer: BLUE CROSS/BLUE SHIELD | Source: Ambulatory Visit | Attending: Radiation Oncology | Admitting: Radiation Oncology

## 2021-05-03 ENCOUNTER — Other Ambulatory Visit: Payer: Self-pay | Admitting: Radiation Oncology

## 2021-05-03 ENCOUNTER — Ambulatory Visit: Payer: BLUE CROSS/BLUE SHIELD | Admitting: Radiation Oncology

## 2021-05-03 DIAGNOSIS — C716 Malignant neoplasm of cerebellum: Secondary | ICD-10-CM | POA: Diagnosis not present

## 2021-05-03 MED ORDER — ONDANSETRON HCL 8 MG PO TABS: 8.0000 mg | ORAL_TABLET | Freq: Three times a day (TID) | ORAL | 0 refills | Status: DC | PRN

## 2021-05-05 ENCOUNTER — Ambulatory Visit
Admission: RE | Admit: 2021-05-05 | Discharge: 2021-05-05 | Disposition: A | Discharge status: Home / Self Care | Payer: BLUE CROSS/BLUE SHIELD | Source: Ambulatory Visit | Attending: Radiation Oncology | Admitting: Radiation Oncology

## 2021-05-05 DIAGNOSIS — C716 Malignant neoplasm of cerebellum: Secondary | ICD-10-CM | POA: Diagnosis not present

## 2021-05-06 ENCOUNTER — Ambulatory Visit: Payer: BLUE CROSS/BLUE SHIELD | Admitting: Radiation Oncology

## 2021-05-06 ENCOUNTER — Ambulatory Visit
Admission: RE | Admit: 2021-05-06 | Discharge: 2021-05-06 | Disposition: A | Discharge status: Home / Self Care | Payer: BLUE CROSS/BLUE SHIELD | Source: Ambulatory Visit | Attending: Radiation Oncology | Admitting: Radiation Oncology

## 2021-05-06 DIAGNOSIS — C716 Malignant neoplasm of cerebellum: Secondary | ICD-10-CM | POA: Diagnosis not present

## 2021-05-07 ENCOUNTER — Other Ambulatory Visit: Payer: Self-pay | Admitting: Radiation Oncology

## 2021-05-07 ENCOUNTER — Other Ambulatory Visit (HOSPITAL_COMMUNITY): Payer: Self-pay

## 2021-05-07 ENCOUNTER — Ambulatory Visit: Payer: BLUE CROSS/BLUE SHIELD | Admitting: Radiation Oncology

## 2021-05-07 ENCOUNTER — Telehealth: Payer: Self-pay | Admitting: *Deleted

## 2021-05-07 ENCOUNTER — Ambulatory Visit
Admission: RE | Admit: 2021-05-07 | Discharge: 2021-05-07 | Disposition: A | Discharge status: Home / Self Care | Payer: BLUE CROSS/BLUE SHIELD | Source: Ambulatory Visit | Attending: Radiation Oncology | Admitting: Radiation Oncology

## 2021-05-07 DIAGNOSIS — C716 Malignant neoplasm of cerebellum: Secondary | ICD-10-CM

## 2021-05-07 MED ORDER — ONDANSETRON HCL 8 MG PO TABS
8.0000 mg | ORAL_TABLET | Freq: Three times a day (TID) | ORAL | 0 refills | Status: DC | PRN
  Filled 2021-05-07: qty 20, 7d supply, fill #0

## 2021-05-07 NOTE — Telephone Encounter (Signed)
Called patient to inform of lab for 05-08-21 @ 8 am and also his lab on 05-13-21 @ 3 pm, spoke with patient and he verified understanding these appts.

## 2021-05-08 ENCOUNTER — Encounter: Payer: Self-pay | Admitting: Radiation Oncology

## 2021-05-08 ENCOUNTER — Ambulatory Visit: Payer: BLUE CROSS/BLUE SHIELD | Admitting: Radiation Oncology

## 2021-05-08 ENCOUNTER — Ambulatory Visit
Admission: RE | Admit: 2021-05-08 | Discharge: 2021-05-08 | Disposition: A | Discharge status: Home / Self Care | Payer: BLUE CROSS/BLUE SHIELD | Source: Ambulatory Visit | Attending: Radiation Oncology | Admitting: Radiation Oncology

## 2021-05-08 ENCOUNTER — Other Ambulatory Visit: Payer: Self-pay

## 2021-05-08 ENCOUNTER — Other Ambulatory Visit (HOSPITAL_COMMUNITY): Payer: Self-pay

## 2021-05-08 DIAGNOSIS — C716 Malignant neoplasm of cerebellum: Secondary | ICD-10-CM | POA: Diagnosis not present

## 2021-05-08 LAB — CBC WITH DIFFERENTIAL (CANCER CENTER ONLY)
Abs Immature Granulocytes: 0.03 10*3/uL (ref 0.00–0.07)
Basophils Absolute: 0 10*3/uL (ref 0.0–0.1)
Basophils Relative: 1 %
Eosinophils Absolute: 0.2 10*3/uL (ref 0.0–0.5)
Eosinophils Relative: 5 %
HCT: 39.6 % (ref 39.0–52.0)
Hemoglobin: 13.8 g/dL (ref 13.0–17.0)
Immature Granulocytes: 1 %
Lymphocytes Relative: 11 %
Lymphs Abs: 0.4 10*3/uL — ABNORMAL LOW (ref 0.7–4.0)
MCH: 31 pg (ref 26.0–34.0)
MCHC: 34.8 g/dL (ref 30.0–36.0)
MCV: 89 fL (ref 80.0–100.0)
Monocytes Absolute: 0.4 10*3/uL (ref 0.1–1.0)
Monocytes Relative: 11 %
Neutro Abs: 2.3 10*3/uL (ref 1.7–7.7)
Neutrophils Relative %: 71 %
Platelet Count: 176 10*3/uL (ref 150–400)
RBC: 4.45 MIL/uL (ref 4.22–5.81)
RDW: 11.9 % (ref 11.5–15.5)
WBC Count: 3.3 10*3/uL — ABNORMAL LOW (ref 4.0–10.5)
nRBC: 0 % (ref 0.0–0.2)

## 2021-05-10 ENCOUNTER — Encounter (HOSPITAL_COMMUNITY): Payer: Self-pay

## 2021-05-13 ENCOUNTER — Other Ambulatory Visit: Payer: Self-pay

## 2021-05-13 ENCOUNTER — Ambulatory Visit
Admission: RE | Admit: 2021-05-13 | Discharge: 2021-05-13 | Disposition: A | Discharge status: Home / Self Care | Payer: BLUE CROSS/BLUE SHIELD | Source: Ambulatory Visit | Attending: Radiation Oncology | Admitting: Radiation Oncology

## 2021-05-13 ENCOUNTER — Other Ambulatory Visit: Payer: Self-pay | Admitting: Radiation Oncology

## 2021-05-13 ENCOUNTER — Ambulatory Visit: Payer: BLUE CROSS/BLUE SHIELD | Admitting: Radiation Oncology

## 2021-05-13 DIAGNOSIS — C716 Malignant neoplasm of cerebellum: Secondary | ICD-10-CM | POA: Diagnosis not present

## 2021-05-13 LAB — CBC WITH DIFFERENTIAL (CANCER CENTER ONLY)
Abs Immature Granulocytes: 0.01 10*3/uL (ref 0.00–0.07)
Basophils Absolute: 0 10*3/uL (ref 0.0–0.1)
Basophils Relative: 1 %
Eosinophils Absolute: 0.1 10*3/uL (ref 0.0–0.5)
Eosinophils Relative: 3 %
HCT: 41.8 % (ref 39.0–52.0)
Hemoglobin: 14.2 g/dL (ref 13.0–17.0)
Immature Granulocytes: 0 %
Lymphocytes Relative: 19 %
Lymphs Abs: 0.6 10*3/uL — ABNORMAL LOW (ref 0.7–4.0)
MCH: 30.6 pg (ref 26.0–34.0)
MCHC: 34 g/dL (ref 30.0–36.0)
MCV: 90.1 fL (ref 80.0–100.0)
Monocytes Absolute: 0.5 10*3/uL (ref 0.1–1.0)
Monocytes Relative: 14 %
Neutro Abs: 2 10*3/uL (ref 1.7–7.7)
Neutrophils Relative %: 63 %
Platelet Count: 173 10*3/uL (ref 150–400)
RBC: 4.64 MIL/uL (ref 4.22–5.81)
RDW: 11.9 % (ref 11.5–15.5)
WBC Count: 3.2 10*3/uL — ABNORMAL LOW (ref 4.0–10.5)
nRBC: 0 % (ref 0.0–0.2)

## 2021-05-14 ENCOUNTER — Ambulatory Visit
Admission: RE | Admit: 2021-05-14 | Discharge: 2021-05-14 | Disposition: A | Discharge status: Home / Self Care | Payer: BLUE CROSS/BLUE SHIELD | Source: Ambulatory Visit | Attending: Radiation Oncology | Admitting: Radiation Oncology

## 2021-05-14 ENCOUNTER — Ambulatory Visit: Payer: BLUE CROSS/BLUE SHIELD | Admitting: Radiation Oncology

## 2021-05-14 ENCOUNTER — Telehealth: Payer: Self-pay | Admitting: Internal Medicine

## 2021-05-14 ENCOUNTER — Encounter: Payer: Self-pay | Admitting: Radiation Oncology

## 2021-05-14 ENCOUNTER — Other Ambulatory Visit: Payer: Self-pay | Admitting: Radiation Oncology

## 2021-05-14 DIAGNOSIS — C716 Malignant neoplasm of cerebellum: Secondary | ICD-10-CM | POA: Diagnosis not present

## 2021-05-14 NOTE — Telephone Encounter (Signed)
Scheduled per sch msg. Called and spoke with patient. Confirmed appt  

## 2021-05-15 ENCOUNTER — Ambulatory Visit
Admission: RE | Admit: 2021-05-15 | Discharge: 2021-05-15 | Disposition: A | Discharge status: Home / Self Care | Payer: BLUE CROSS/BLUE SHIELD | Source: Ambulatory Visit | Attending: Radiation Oncology | Admitting: Radiation Oncology

## 2021-05-15 ENCOUNTER — Other Ambulatory Visit: Payer: Self-pay

## 2021-05-15 ENCOUNTER — Telehealth: Payer: Self-pay | Admitting: *Deleted

## 2021-05-15 ENCOUNTER — Ambulatory Visit: Payer: BLUE CROSS/BLUE SHIELD | Admitting: Radiation Oncology

## 2021-05-15 DIAGNOSIS — C716 Malignant neoplasm of cerebellum: Secondary | ICD-10-CM | POA: Diagnosis not present

## 2021-05-15 NOTE — Telephone Encounter (Signed)
CALLED PATIENT TO REMIND OF LAB AFTER TX. ON 05-16-21, NO ANSWER

## 2021-05-16 ENCOUNTER — Ambulatory Visit
Admission: RE | Admit: 2021-05-16 | Discharge: 2021-05-16 | Disposition: A | Discharge status: Home / Self Care | Payer: BLUE CROSS/BLUE SHIELD | Source: Ambulatory Visit | Attending: Radiation Oncology | Admitting: Radiation Oncology

## 2021-05-16 ENCOUNTER — Ambulatory Visit: Payer: BLUE CROSS/BLUE SHIELD | Admitting: Radiation Oncology

## 2021-05-16 DIAGNOSIS — Z79899 Other long term (current) drug therapy: Secondary | ICD-10-CM | POA: Insufficient documentation

## 2021-05-16 DIAGNOSIS — Z510 Encounter for antineoplastic radiation therapy: Secondary | ICD-10-CM | POA: Insufficient documentation

## 2021-05-16 DIAGNOSIS — C716 Malignant neoplasm of cerebellum: Secondary | ICD-10-CM | POA: Insufficient documentation

## 2021-05-16 LAB — CBC WITH DIFFERENTIAL (CANCER CENTER ONLY)
Abs Immature Granulocytes: 0.01 10*3/uL (ref 0.00–0.07)
Basophils Absolute: 0 10*3/uL (ref 0.0–0.1)
Basophils Relative: 1 %
Eosinophils Absolute: 0.1 10*3/uL (ref 0.0–0.5)
Eosinophils Relative: 3 %
HCT: 39.6 % (ref 39.0–52.0)
Hemoglobin: 13.4 g/dL (ref 13.0–17.0)
Immature Granulocytes: 0 %
Lymphocytes Relative: 14 %
Lymphs Abs: 0.5 10*3/uL — ABNORMAL LOW (ref 0.7–4.0)
MCH: 30 pg (ref 26.0–34.0)
MCHC: 33.8 g/dL (ref 30.0–36.0)
MCV: 88.8 fL (ref 80.0–100.0)
Monocytes Absolute: 0.4 10*3/uL (ref 0.1–1.0)
Monocytes Relative: 12 %
Neutro Abs: 2.2 10*3/uL (ref 1.7–7.7)
Neutrophils Relative %: 70 %
Platelet Count: 155 10*3/uL (ref 150–400)
RBC: 4.46 MIL/uL (ref 4.22–5.81)
RDW: 11.8 % (ref 11.5–15.5)
WBC Count: 3.2 10*3/uL — ABNORMAL LOW (ref 4.0–10.5)
nRBC: 0 % (ref 0.0–0.2)

## 2021-05-17 ENCOUNTER — Other Ambulatory Visit (HOSPITAL_COMMUNITY): Payer: Self-pay

## 2021-05-17 ENCOUNTER — Encounter: Payer: Self-pay | Admitting: Radiation Oncology

## 2021-05-17 ENCOUNTER — Ambulatory Visit
Admission: RE | Admit: 2021-05-17 | Discharge: 2021-05-17 | Disposition: A | Discharge status: Home / Self Care | Payer: BLUE CROSS/BLUE SHIELD | Source: Ambulatory Visit | Attending: Radiation Oncology | Admitting: Radiation Oncology

## 2021-05-17 ENCOUNTER — Other Ambulatory Visit: Payer: Self-pay

## 2021-05-17 ENCOUNTER — Other Ambulatory Visit: Payer: Self-pay | Admitting: Radiation Oncology

## 2021-05-17 ENCOUNTER — Ambulatory Visit: Payer: BLUE CROSS/BLUE SHIELD | Admitting: Radiation Oncology

## 2021-05-17 DIAGNOSIS — C716 Malignant neoplasm of cerebellum: Secondary | ICD-10-CM | POA: Diagnosis not present

## 2021-05-17 MED ORDER — ONDANSETRON HCL 8 MG PO TABS
8.0000 mg | ORAL_TABLET | Freq: Three times a day (TID) | ORAL | 0 refills | Status: DC | PRN
  Filled 2021-05-17: qty 18, 21d supply, fill #0
  Filled 2021-05-30: qty 12, 4d supply, fill #1

## 2021-05-20 ENCOUNTER — Ambulatory Visit
Admission: RE | Admit: 2021-05-20 | Discharge: 2021-05-20 | Disposition: A | Discharge status: Home / Self Care | Payer: BLUE CROSS/BLUE SHIELD | Source: Ambulatory Visit | Attending: Radiation Oncology | Admitting: Radiation Oncology

## 2021-05-20 ENCOUNTER — Encounter: Payer: Self-pay | Admitting: Radiation Oncology

## 2021-05-20 ENCOUNTER — Other Ambulatory Visit: Payer: Self-pay

## 2021-05-20 ENCOUNTER — Ambulatory Visit: Payer: BLUE CROSS/BLUE SHIELD | Admitting: Radiation Oncology

## 2021-05-20 ENCOUNTER — Other Ambulatory Visit (HOSPITAL_COMMUNITY): Payer: Self-pay

## 2021-05-20 DIAGNOSIS — C716 Malignant neoplasm of cerebellum: Secondary | ICD-10-CM

## 2021-05-20 LAB — CBC WITH DIFFERENTIAL (CANCER CENTER ONLY)
Abs Immature Granulocytes: 0.01 10*3/uL (ref 0.00–0.07)
Basophils Absolute: 0 10*3/uL (ref 0.0–0.1)
Basophils Relative: 1 %
Eosinophils Absolute: 0.1 10*3/uL (ref 0.0–0.5)
Eosinophils Relative: 3 %
HCT: 40.7 % (ref 39.0–52.0)
Hemoglobin: 14.5 g/dL (ref 13.0–17.0)
Immature Granulocytes: 0 %
Lymphocytes Relative: 12 %
Lymphs Abs: 0.4 10*3/uL — ABNORMAL LOW (ref 0.7–4.0)
MCH: 31.3 pg (ref 26.0–34.0)
MCHC: 35.6 g/dL (ref 30.0–36.0)
MCV: 87.7 fL (ref 80.0–100.0)
Monocytes Absolute: 0.5 10*3/uL (ref 0.1–1.0)
Monocytes Relative: 15 %
Neutro Abs: 2.3 10*3/uL (ref 1.7–7.7)
Neutrophils Relative %: 69 %
Platelet Count: 134 10*3/uL — ABNORMAL LOW (ref 150–400)
RBC: 4.64 MIL/uL (ref 4.22–5.81)
RDW: 11.8 % (ref 11.5–15.5)
WBC Count: 3.4 10*3/uL — ABNORMAL LOW (ref 4.0–10.5)
nRBC: 0 % (ref 0.0–0.2)

## 2021-05-21 ENCOUNTER — Ambulatory Visit
Admission: RE | Admit: 2021-05-21 | Discharge: 2021-05-21 | Disposition: A | Discharge status: Home / Self Care | Payer: BLUE CROSS/BLUE SHIELD | Source: Ambulatory Visit | Attending: Radiation Oncology | Admitting: Radiation Oncology

## 2021-05-21 ENCOUNTER — Ambulatory Visit: Payer: BLUE CROSS/BLUE SHIELD | Admitting: Radiation Oncology

## 2021-05-21 DIAGNOSIS — C716 Malignant neoplasm of cerebellum: Secondary | ICD-10-CM | POA: Diagnosis not present

## 2021-05-22 ENCOUNTER — Ambulatory Visit: Payer: BLUE CROSS/BLUE SHIELD | Admitting: Radiation Oncology

## 2021-05-22 ENCOUNTER — Ambulatory Visit
Admission: RE | Admit: 2021-05-22 | Discharge: 2021-05-22 | Disposition: A | Discharge status: Home / Self Care | Payer: BLUE CROSS/BLUE SHIELD | Source: Ambulatory Visit | Attending: Radiation Oncology | Admitting: Radiation Oncology

## 2021-05-22 ENCOUNTER — Other Ambulatory Visit: Payer: Self-pay

## 2021-05-22 DIAGNOSIS — C716 Malignant neoplasm of cerebellum: Secondary | ICD-10-CM | POA: Diagnosis not present

## 2021-05-23 ENCOUNTER — Inpatient Hospital Stay: Payer: BLUE CROSS/BLUE SHIELD | Attending: Internal Medicine | Admitting: Internal Medicine

## 2021-05-23 ENCOUNTER — Ambulatory Visit
Admission: RE | Admit: 2021-05-23 | Discharge: 2021-05-23 | Disposition: A | Discharge status: Home / Self Care | Payer: BLUE CROSS/BLUE SHIELD | Source: Ambulatory Visit | Attending: Radiation Oncology | Admitting: Radiation Oncology

## 2021-05-23 ENCOUNTER — Other Ambulatory Visit: Payer: Self-pay

## 2021-05-23 ENCOUNTER — Other Ambulatory Visit (HOSPITAL_COMMUNITY): Payer: Self-pay

## 2021-05-23 ENCOUNTER — Ambulatory Visit: Payer: BLUE CROSS/BLUE SHIELD | Admitting: Radiation Oncology

## 2021-05-23 VITALS — BP 111/66 | HR 76 | Temp 97.8°F | Resp 20 | Wt 211.8 lb

## 2021-05-23 DIAGNOSIS — Z510 Encounter for antineoplastic radiation therapy: Secondary | ICD-10-CM | POA: Insufficient documentation

## 2021-05-23 DIAGNOSIS — C716 Malignant neoplasm of cerebellum: Secondary | ICD-10-CM

## 2021-05-23 DIAGNOSIS — Z79899 Other long term (current) drug therapy: Secondary | ICD-10-CM | POA: Insufficient documentation

## 2021-05-23 LAB — CBC WITH DIFFERENTIAL (CANCER CENTER ONLY)
Abs Immature Granulocytes: 0.01 10*3/uL (ref 0.00–0.07)
Basophils Absolute: 0 10*3/uL (ref 0.0–0.1)
Basophils Relative: 1 %
Eosinophils Absolute: 0.1 10*3/uL (ref 0.0–0.5)
Eosinophils Relative: 5 %
HCT: 38.8 % — ABNORMAL LOW (ref 39.0–52.0)
Hemoglobin: 13.8 g/dL (ref 13.0–17.0)
Immature Granulocytes: 0 %
Lymphocytes Relative: 13 %
Lymphs Abs: 0.4 10*3/uL — ABNORMAL LOW (ref 0.7–4.0)
MCH: 31.3 pg (ref 26.0–34.0)
MCHC: 35.6 g/dL (ref 30.0–36.0)
MCV: 88 fL (ref 80.0–100.0)
Monocytes Absolute: 0.4 10*3/uL (ref 0.1–1.0)
Monocytes Relative: 15 %
Neutro Abs: 1.9 10*3/uL (ref 1.7–7.7)
Neutrophils Relative %: 66 %
Platelet Count: 136 10*3/uL — ABNORMAL LOW (ref 150–400)
RBC: 4.41 MIL/uL (ref 4.22–5.81)
RDW: 11.9 % (ref 11.5–15.5)
WBC Count: 2.8 10*3/uL — ABNORMAL LOW (ref 4.0–10.5)
nRBC: 0 % (ref 0.0–0.2)

## 2021-05-23 MED ORDER — DEXAMETHASONE 1 MG PO TABS
1.0000 mg | ORAL_TABLET | Freq: Every day | ORAL | 1 refills | Status: DC
Start: 2021-05-23 — End: 2021-07-01
  Filled 2021-05-23: qty 30, 30d supply, fill #0

## 2021-05-23 MED ORDER — DEXAMETHASONE 1 MG PO TABS
1.0000 mg | ORAL_TABLET | Freq: Two times a day (BID) | ORAL | 1 refills | Status: DC
  Filled 2021-05-23: qty 60, 30d supply, fill #0

## 2021-05-23 NOTE — Progress Notes (Signed)
[facility] [place], [place] [address] [phone]   Interval Evaluation  Date of Service: 05/23/21 Patient Name: Scott Snyder Patient MRN: [id number] Patient DOB: 12-29-1995 Provider: Ventura Sellers, MD  Identifying Statement:  Scott Snyder is a 25 y.o. 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 Zada Finders; path demonstrates medulloblastoma [facility]   03/20/2021 Surgery   Re-do craniotomy for residual tumor; result is gross total resection     Biomarkers:  [facility] PTCH1 mutant .  TP53 Wild type.  MYC pending  TERT "Mutated   Interval History: Scott Snyder presents today for follow up, now having completed 4 weeks of craniospinal irradiation for medulloblastoma.  He describes overall good tolerance of radiation, main complain is frequent nausea.  He has been dosing the zofran every 8 hours, which does help, but doesn't eliminate the symptom.  He is completing the abdominal/spinal portion of the radiation this week.  Remains active independent, managing fatigue well.  No explicit cognitive complaints.  H+P (04/04/21) Scott Snyder presented to medical attention last month with several weeks history of new onset headache syndrome.  He described daily AM headaches, increasing severity over time, not responsive to headache medications.  In days prior to ED visit, he began to experience gait difficulty and imbalance.  CNS imaging demonstrated large posterior fossa mass, which was resected in 2 craniotomy sessions with Dr. Zada Finders (most recent 03/20/21).  Since surgery he feels considerably improved, no headaches, no balance issues.  He is functionally intact, living at home, no steroids.  He presents today for path review and treatment planning.  Medications: Current Outpatient Medications on File Prior  to Visit  Medication Sig Dispense Refill   memantine (NAMENDA) 10 MG tablet Take 1 tablet (10 mg total) by mouth 2 (two) times daily. 60 tablet 4   memantine (NAMENDA) 5 MG tablet Begin this prescription the first day of brain radiation. Week 1: take 1 tablet by mouth every morning. Week 2: take 1 tablet every morning and evening. Week 3: take 2 tablets every morning, and 1 tablet every evening. Week 4: take 2 tablets every morning and evening. Then fill prescription for 18m tablets. 70 tablet 0   ondansetron (ZOFRAN) 8 MG tablet Take 1 tablet by mouth every 8 hours as needed for nausea or vomiting. 30 tablet 0   No current facility-administered medications on file prior to visit.    Allergies: No Known Allergies Past Medical History:  Past Medical History:  Diagnosis Date   Headache    Medulloblastoma ([facility] 02/2021   Past Surgical History:  Past Surgical History:  Procedure Laterality Date   APPLICATION OF CRANIAL NAVIGATION N/A 03/11/2021   Procedure: APPLICATION OF CRANIAL NAVIGATION;  Surgeon: OJudith Part MD;  [facility]  Service: Neurosurgery;  Laterality: N/A;   APPLICATION OF CRANIAL NAVIGATION N/A 03/20/2021   Procedure: APPLICATION OF CRANIAL NAVIGATION;  Surgeon: OJudith Part MD;  [facility] [facility]  Service: Neurosurgery;  Laterality: N/A;   SUBOCCIPITAL CRANIECTOMY CERVICAL LAMINECTOMY N/A 03/11/2021   Procedure: SUBOCCIPITAL CRANIECTOMY FOR TUMOR WITH VENTRICULAR DRAIN;  Surgeon: OJudith Part MD;  Location: [facility]  Service: Neurosurgery;  Laterality: N/A;   SUBOCCIPITAL CRANIECTOMY CERVICAL LAMINECTOMY N/A 03/20/2021   Procedure: REDO SUBOCCIPITAL CRANIECTOMY FOR BRAIN TUMOR;  Surgeon: OJudith Part MD;  [place] [facility]  Service: Neurosurgery;  Laterality: N/A;   Social History:  Social History   Socioeconomic History   Marital status: Single    Spouse name: Not on file   Number of children: Not on file   Years of education: Not on  file   Highest education level: Not on file  Occupational History   Not on file  Tobacco Use   Smoking status: Never   Smokeless tobacco: Never  Vaping Use   Vaping Use: Never used  Substance and Sexual Activity   Alcohol use: Not Currently    Comment: socially   Drug use: Yes   Sexual activity: Yes    Birth control/protection: Condom  Other Topics Concern   Not on file  Social History Narrative   Not on file   Social Determinants of Health   Financial Resource Strain: Not on file  Food Insecurity: Not on file  Transportation Needs: Not on file  Physical Activity: Not on file  Stress: Not on file  Social Connections: Not on file  Intimate Partner Violence: Not on file   Family History:  Family History  Problem Relation Age of Onset   Other Brother    Thyroid cancer Maternal Aunt    Breast cancer Other    Thyroid cancer Other    Thyroid cancer Maternal Great-grandfather     Review of Systems: Constitutional: Doesn't report fevers, chills or abnormal weight loss Eyes: Doesn't report blurriness of vision Ears, nose, mouth, throat, and face: Doesn't report sore throat Respiratory: Doesn't report cough, dyspnea or wheezes Cardiovascular: Doesn't report palpitation, chest discomfort  Gastrointestinal:  Doesn't report nausea, constipation, diarrhea GU: Doesn't report incontinence Skin: Doesn't report skin rashes Neurological: Per HPI Musculoskeletal: Doesn't report joint pain Behavioral/Psych: Doesn't report anxiety  Physical Exam: Vitals:   05/23/21 1043  BP: 111/66  Pulse: 76  Resp: 20  Temp: 97.8 F (36.6 C)  SpO2: 100%    KPS: 90. General: Alert, cooperative, pleasant, in no acute distress Head: Normal EENT: No conjunctival injection or scleral icterus.  Lungs: Resp effort normal Cardiac: Regular rate Abdomen: Non-distended abdomen Skin: No rashes cyanosis or petechiae. Extremities: No clubbing or edema  Neurologic Exam: Mental Status: Awake,  alert, attentive to examiner. Oriented to self and environment. Language is fluent with intact comprehension.  Cranial Nerves: Visual acuity is grossly normal. Visual fields are full. Extra-ocular movements intact. No ptosis. Face is symmetric Motor: Tone and bulk are normal. Power is full in both arms and legs. Reflexes are symmetric, no pathologic reflexes present.  Sensory: Intact to light touch Gait: Normal.   Labs: I have reviewed the data as listed    Component Value Date/Time   NA 137 03/11/2021 0846   K 4.0 03/11/2021 0846   CL 104 03/10/2021 0439   CO2 19 (L) 03/10/2021 0439   GLUCOSE 130 (H) 03/10/2021 0439   BUN 23 (H) 03/10/2021 0439   CREATININE 0.97 03/20/2021 1217   CALCIUM 10.0 03/10/2021 0439   PROT 7.3 03/07/2021 0420   ALBUMIN 4.5 03/10/2021 0439   AST 14 (L) 03/07/2021 0420   ALT 14 03/07/2021 0420   ALKPHOS 40 03/07/2021 0420   BILITOT 1.0 03/07/2021 0420   GFRNONAA >60 03/20/2021 1217   GFRAA >60 07/12/2017 0433   Lab Results  Component Value Date   WBC 2.8 (L) 05/23/2021   NEUTROABS 1.9 05/23/2021   HGB 13.8 05/23/2021   HCT 38.8 (L) 05/23/2021   MCV 88.0 05/23/2021   PLT 136 (L) 05/23/2021  Assessment/Plan Medulloblastoma, 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  Because of poor appetite, lingering nausea, recommended trial of low dose decadron in addition to the zofran.  Will dose 40m daily x4 days, then 126mdaily thereafter.  We reviewed CARIS sequencing results today, and further discussed potential role for chemotherapy in adult medulloblastoma; we will consider cisplatin+CCNU based regimen following completion of radiotherapy.  We ask that EvTownes Fuhseturn to clinic in 1 months with post-RT brain for evaluation, or sooner as needed.  All questions were answered. The patient knows to call the clinic with any problems, questions or  concerns. No barriers to learning were detected.  The total time spent in the encounter was 30 minutes and more than 50% was on counseling and review of test results   ZaVentura SellersMD Medical Director of Neuro-Oncology [facility]2/08/22 10:47 AM

## 2021-05-24 ENCOUNTER — Ambulatory Visit
Admission: RE | Admit: 2021-05-24 | Discharge: 2021-05-24 | Disposition: A | Discharge status: Home / Self Care | Payer: BLUE CROSS/BLUE SHIELD | Source: Ambulatory Visit | Attending: Radiation Oncology | Admitting: Radiation Oncology

## 2021-05-24 ENCOUNTER — Ambulatory Visit: Payer: BLUE CROSS/BLUE SHIELD | Admitting: Radiation Oncology

## 2021-05-24 ENCOUNTER — Telehealth: Payer: Self-pay | Admitting: Internal Medicine

## 2021-05-24 ENCOUNTER — Other Ambulatory Visit (HOSPITAL_COMMUNITY): Payer: Self-pay

## 2021-05-24 DIAGNOSIS — C716 Malignant neoplasm of cerebellum: Secondary | ICD-10-CM | POA: Diagnosis not present

## 2021-05-24 NOTE — Telephone Encounter (Signed)
Scheduled per 12/8 los, attempted to call pt, voicemail was full, will mail calender to pt

## 2021-05-27 ENCOUNTER — Ambulatory Visit
Admission: RE | Admit: 2021-05-27 | Discharge: 2021-05-27 | Disposition: A | Discharge status: Home / Self Care | Payer: BLUE CROSS/BLUE SHIELD | Source: Ambulatory Visit | Attending: Radiation Oncology | Admitting: Radiation Oncology

## 2021-05-27 ENCOUNTER — Other Ambulatory Visit: Payer: Self-pay

## 2021-05-27 ENCOUNTER — Ambulatory Visit: Payer: BLUE CROSS/BLUE SHIELD | Admitting: Radiation Oncology

## 2021-05-27 DIAGNOSIS — C716 Malignant neoplasm of cerebellum: Secondary | ICD-10-CM | POA: Diagnosis not present

## 2021-05-27 LAB — CBC WITH DIFFERENTIAL (CANCER CENTER ONLY)
Abs Immature Granulocytes: 0.01 10*3/uL (ref 0.00–0.07)
Basophils Absolute: 0 10*3/uL (ref 0.0–0.1)
Basophils Relative: 2 %
Eosinophils Absolute: 0.2 10*3/uL (ref 0.0–0.5)
Eosinophils Relative: 7 %
HCT: 39.3 % (ref 39.0–52.0)
Hemoglobin: 14 g/dL (ref 13.0–17.0)
Immature Granulocytes: 0 %
Lymphocytes Relative: 15 %
Lymphs Abs: 0.4 10*3/uL — ABNORMAL LOW (ref 0.7–4.0)
MCH: 31 pg (ref 26.0–34.0)
MCHC: 35.6 g/dL (ref 30.0–36.0)
MCV: 86.9 fL (ref 80.0–100.0)
Monocytes Absolute: 0.4 10*3/uL (ref 0.1–1.0)
Monocytes Relative: 14 %
Neutro Abs: 1.7 10*3/uL (ref 1.7–7.7)
Neutrophils Relative %: 62 %
Platelet Count: 178 10*3/uL (ref 150–400)
RBC: 4.52 MIL/uL (ref 4.22–5.81)
RDW: 12 % (ref 11.5–15.5)
WBC Count: 2.7 10*3/uL — ABNORMAL LOW (ref 4.0–10.5)
nRBC: 0 % (ref 0.0–0.2)

## 2021-05-28 ENCOUNTER — Ambulatory Visit
Admission: RE | Admit: 2021-05-28 | Discharge: 2021-05-28 | Disposition: A | Discharge status: Home / Self Care | Payer: BLUE CROSS/BLUE SHIELD | Source: Ambulatory Visit | Attending: Radiation Oncology | Admitting: Radiation Oncology

## 2021-05-28 ENCOUNTER — Ambulatory Visit: Payer: BLUE CROSS/BLUE SHIELD | Admitting: Radiation Oncology

## 2021-05-28 DIAGNOSIS — C716 Malignant neoplasm of cerebellum: Secondary | ICD-10-CM | POA: Diagnosis not present

## 2021-05-29 ENCOUNTER — Other Ambulatory Visit: Payer: Self-pay

## 2021-05-29 ENCOUNTER — Ambulatory Visit
Admission: RE | Admit: 2021-05-29 | Discharge: 2021-05-29 | Disposition: A | Discharge status: Home / Self Care | Payer: BLUE CROSS/BLUE SHIELD | Source: Ambulatory Visit | Attending: Radiation Oncology | Admitting: Radiation Oncology

## 2021-05-29 ENCOUNTER — Ambulatory Visit: Payer: BLUE CROSS/BLUE SHIELD | Admitting: Radiation Oncology

## 2021-05-29 DIAGNOSIS — C716 Malignant neoplasm of cerebellum: Secondary | ICD-10-CM | POA: Diagnosis not present

## 2021-05-30 ENCOUNTER — Other Ambulatory Visit (HOSPITAL_COMMUNITY): Payer: Self-pay

## 2021-05-30 ENCOUNTER — Ambulatory Visit: Payer: BLUE CROSS/BLUE SHIELD | Admitting: Radiation Oncology

## 2021-05-30 ENCOUNTER — Ambulatory Visit
Admission: RE | Admit: 2021-05-30 | Discharge: 2021-05-30 | Disposition: A | Discharge status: Home / Self Care | Payer: BLUE CROSS/BLUE SHIELD | Source: Ambulatory Visit | Attending: Radiation Oncology | Admitting: Radiation Oncology

## 2021-05-30 DIAGNOSIS — C716 Malignant neoplasm of cerebellum: Secondary | ICD-10-CM | POA: Diagnosis not present

## 2021-05-30 LAB — CBC WITH DIFFERENTIAL (CANCER CENTER ONLY)
Abs Immature Granulocytes: 0.01 10*3/uL (ref 0.00–0.07)
Basophils Absolute: 0 10*3/uL (ref 0.0–0.1)
Basophils Relative: 1 %
Eosinophils Absolute: 0.1 10*3/uL (ref 0.0–0.5)
Eosinophils Relative: 2 %
HCT: 41.7 % (ref 39.0–52.0)
Hemoglobin: 14.9 g/dL (ref 13.0–17.0)
Immature Granulocytes: 0 %
Lymphocytes Relative: 14 %
Lymphs Abs: 0.7 10*3/uL (ref 0.7–4.0)
MCH: 31.3 pg (ref 26.0–34.0)
MCHC: 35.7 g/dL (ref 30.0–36.0)
MCV: 87.6 fL (ref 80.0–100.0)
Monocytes Absolute: 0.6 10*3/uL (ref 0.1–1.0)
Monocytes Relative: 13 %
Neutro Abs: 3.3 10*3/uL (ref 1.7–7.7)
Neutrophils Relative %: 70 %
Platelet Count: 220 10*3/uL (ref 150–400)
RBC: 4.76 MIL/uL (ref 4.22–5.81)
RDW: 11.9 % (ref 11.5–15.5)
WBC Count: 4.6 10*3/uL (ref 4.0–10.5)
nRBC: 0 % (ref 0.0–0.2)

## 2021-05-31 ENCOUNTER — Ambulatory Visit
Admission: RE | Admit: 2021-05-31 | Discharge: 2021-05-31 | Disposition: A | Discharge status: Home / Self Care | Payer: BLUE CROSS/BLUE SHIELD | Source: Ambulatory Visit | Attending: Radiation Oncology | Admitting: Radiation Oncology

## 2021-05-31 ENCOUNTER — Other Ambulatory Visit: Payer: Self-pay

## 2021-05-31 ENCOUNTER — Other Ambulatory Visit: Payer: Self-pay | Admitting: Radiation Oncology

## 2021-05-31 ENCOUNTER — Ambulatory Visit: Payer: BLUE CROSS/BLUE SHIELD | Admitting: Radiation Oncology

## 2021-05-31 ENCOUNTER — Other Ambulatory Visit (HOSPITAL_COMMUNITY): Payer: Self-pay

## 2021-05-31 DIAGNOSIS — C716 Malignant neoplasm of cerebellum: Secondary | ICD-10-CM | POA: Diagnosis not present

## 2021-05-31 MED ORDER — PROCHLORPERAZINE MALEATE 10 MG PO TABS
10.0000 mg | ORAL_TABLET | Freq: Three times a day (TID) | ORAL | 0 refills | Status: DC | PRN
  Filled 2021-05-31: qty 60, 20d supply, fill #0

## 2021-06-03 ENCOUNTER — Other Ambulatory Visit: Payer: Self-pay

## 2021-06-03 ENCOUNTER — Ambulatory Visit
Admission: RE | Admit: 2021-06-03 | Discharge: 2021-06-03 | Disposition: A | Discharge status: Home / Self Care | Payer: BLUE CROSS/BLUE SHIELD | Source: Ambulatory Visit | Attending: Radiation Oncology | Admitting: Radiation Oncology

## 2021-06-03 DIAGNOSIS — C716 Malignant neoplasm of cerebellum: Secondary | ICD-10-CM

## 2021-06-03 LAB — CBC WITH DIFFERENTIAL (CANCER CENTER ONLY)
Abs Immature Granulocytes: 0.04 10*3/uL (ref 0.00–0.07)
Basophils Absolute: 0 10*3/uL (ref 0.0–0.1)
Basophils Relative: 1 %
Eosinophils Absolute: 0.2 10*3/uL (ref 0.0–0.5)
Eosinophils Relative: 4 %
HCT: 41.2 % (ref 39.0–52.0)
Hemoglobin: 15 g/dL (ref 13.0–17.0)
Immature Granulocytes: 1 %
Lymphocytes Relative: 18 %
Lymphs Abs: 0.8 10*3/uL (ref 0.7–4.0)
MCH: 31.2 pg (ref 26.0–34.0)
MCHC: 36.4 g/dL — ABNORMAL HIGH (ref 30.0–36.0)
MCV: 85.7 fL (ref 80.0–100.0)
Monocytes Absolute: 0.6 10*3/uL (ref 0.1–1.0)
Monocytes Relative: 14 %
Neutro Abs: 2.8 10*3/uL (ref 1.7–7.7)
Neutrophils Relative %: 62 %
Platelet Count: 233 10*3/uL (ref 150–400)
RBC: 4.81 MIL/uL (ref 4.22–5.81)
RDW: 11.9 % (ref 11.5–15.5)
WBC Count: 4.4 10*3/uL (ref 4.0–10.5)
nRBC: 0 % (ref 0.0–0.2)

## 2021-06-04 ENCOUNTER — Other Ambulatory Visit: Payer: Self-pay | Admitting: Radiation Oncology

## 2021-06-04 ENCOUNTER — Ambulatory Visit
Admission: RE | Admit: 2021-06-04 | Discharge: 2021-06-04 | Disposition: A | Discharge status: Home / Self Care | Payer: BLUE CROSS/BLUE SHIELD | Source: Ambulatory Visit | Attending: Radiation Oncology | Admitting: Radiation Oncology

## 2021-06-04 ENCOUNTER — Other Ambulatory Visit: Payer: Self-pay | Admitting: Radiation Therapy

## 2021-06-04 ENCOUNTER — Other Ambulatory Visit (HOSPITAL_COMMUNITY): Payer: Self-pay

## 2021-06-04 DIAGNOSIS — C716 Malignant neoplasm of cerebellum: Secondary | ICD-10-CM

## 2021-06-04 MED ORDER — LORAZEPAM 0.5 MG PO TABS
ORAL_TABLET | ORAL | 0 refills | Status: DC
  Filled 2021-06-04: qty 60, 10d supply, fill #0

## 2021-06-04 MED ORDER — ONDANSETRON HCL 8 MG PO TABS
8.0000 mg | ORAL_TABLET | Freq: Three times a day (TID) | ORAL | 1 refills | Status: DC
  Filled 2021-06-04: qty 18, 21d supply, fill #0

## 2021-06-04 NOTE — Progress Notes (Signed)
°  Radiation Oncology         [phone] ________________________________  Name: Scott Snyder MRN: [id number]  Date: 06/06/2021  DOB: 08-Jun-1996  End of Treatment Note  Diagnosis:    Medulloblastoma of the left cerebellum     Indication for treatment: curative      Radiation treatment dates:   04/24/21-06/06/21  Site/planned dose:   The patient was treated with craniospinal radiotherapy to a dose of 36 GY in 20 fractions with a boost of 19.8 Gy to the brain in an additional 11 fractions.   He developed fatigue and anticipated skin changes in the treatment field. He developed nausea as well despite Zofran and was started on Compazine as well. We added Ativan which did seem to help. He was started on Namenda as well to try to reduce long term changes of cognition. His labs were checked regularly during therapy and he had several mild lowering of his ANC, WBC, and PLTs, but did not require intervention.   Plan: The patient will receive a call in about one month from the radiation oncology department. He will continue follow up with Dr. Mickeal Skinner as well as have another CBC with diff when he returns home after the holidays.       Carola Rhine, PAC

## 2021-06-04 NOTE — Progress Notes (Signed)
I called and spoke with the patient to see how he was doing since he had more nausea and needed more antiemetics during his last under treatment visit. He has not found compazine to be as helpful and is still waking up in the middle of the early hours of the morning nauseated and vomiting up stomach acid. He's not been eating well and only tolerating protein drinks and trying to stay hydrated with water. He needs a new prescription for Zofran as he only has one tablet left. I will send in a new prescription but also discussed adding Ativan. We reviewed side effect profile of this and he is in agreement to try this. He will finish up radiation later this week and see Dr. Mickeal Skinner next month, but I recommended he have blood work checked again when he gets back in town after the [place] to ensure any counts have recovered but thankfully he has not experienced any pancytopenia during radiation thusfar.

## 2021-06-05 ENCOUNTER — Ambulatory Visit
Admission: RE | Admit: 2021-06-05 | Discharge: 2021-06-05 | Disposition: A | Discharge status: Home / Self Care | Payer: BLUE CROSS/BLUE SHIELD | Source: Ambulatory Visit | Attending: Radiation Oncology | Admitting: Radiation Oncology

## 2021-06-05 ENCOUNTER — Other Ambulatory Visit: Payer: Self-pay

## 2021-06-05 DIAGNOSIS — C716 Malignant neoplasm of cerebellum: Secondary | ICD-10-CM | POA: Diagnosis not present

## 2021-06-06 ENCOUNTER — Encounter: Payer: Self-pay | Admitting: Radiation Oncology

## 2021-06-06 ENCOUNTER — Ambulatory Visit
Admission: RE | Admit: 2021-06-06 | Discharge: 2021-06-06 | Disposition: A | Discharge status: Home / Self Care | Payer: BLUE CROSS/BLUE SHIELD | Source: Ambulatory Visit | Attending: Radiation Oncology | Admitting: Radiation Oncology

## 2021-06-06 ENCOUNTER — Telehealth: Payer: Self-pay | Admitting: *Deleted

## 2021-06-06 ENCOUNTER — Ambulatory Visit: Payer: BLUE CROSS/BLUE SHIELD

## 2021-06-06 DIAGNOSIS — C716 Malignant neoplasm of cerebellum: Secondary | ICD-10-CM

## 2021-06-06 LAB — CBC WITH DIFFERENTIAL (CANCER CENTER ONLY)
Abs Immature Granulocytes: 0.01 10*3/uL (ref 0.00–0.07)
Basophils Absolute: 0 10*3/uL (ref 0.0–0.1)
Basophils Relative: 1 %
Eosinophils Absolute: 0.2 10*3/uL (ref 0.0–0.5)
Eosinophils Relative: 4 %
HCT: 40.4 % (ref 39.0–52.0)
Hemoglobin: 14.6 g/dL (ref 13.0–17.0)
Immature Granulocytes: 0 %
Lymphocytes Relative: 18 %
Lymphs Abs: 0.7 10*3/uL (ref 0.7–4.0)
MCH: 31 pg (ref 26.0–34.0)
MCHC: 36.1 g/dL — ABNORMAL HIGH (ref 30.0–36.0)
MCV: 85.8 fL (ref 80.0–100.0)
Monocytes Absolute: 0.5 10*3/uL (ref 0.1–1.0)
Monocytes Relative: 14 %
Neutro Abs: 2.4 10*3/uL (ref 1.7–7.7)
Neutrophils Relative %: 63 %
Platelet Count: 213 10*3/uL (ref 150–400)
RBC: 4.71 MIL/uL (ref 4.22–5.81)
RDW: 11.8 % (ref 11.5–15.5)
WBC Count: 3.9 10*3/uL — ABNORMAL LOW (ref 4.0–10.5)
nRBC: 0 % (ref 0.0–0.2)

## 2021-06-06 NOTE — Telephone Encounter (Signed)
Spoke with the patient to inquire how he is feeling with the medication changes.  He states his nausea is much better, however he is still unable to keep solid foods down.  He reports he is supplementing with protein shakes and is drinking plenty of water.  He is scheduled to see the medical oncologist in a couple weeks.  He was advised to give [place] a call with any concerns or changes.  He will have labs drawn in 2 weeks.  He verbalized understanding.  Gloriajean Dell. Leonie Green, BSN

## 2021-06-07 ENCOUNTER — Ambulatory Visit: Payer: BLUE CROSS/BLUE SHIELD

## 2021-06-19 ENCOUNTER — Other Ambulatory Visit: Payer: Self-pay | Admitting: *Deleted

## 2021-06-19 ENCOUNTER — Other Ambulatory Visit: Payer: Self-pay

## 2021-06-19 ENCOUNTER — Ambulatory Visit
Admission: RE | Admit: 2021-06-19 | Discharge: 2021-06-19 | Disposition: A | Discharge status: Home / Self Care | Payer: BLUE CROSS/BLUE SHIELD | Source: Ambulatory Visit | Attending: Radiation Oncology | Admitting: Radiation Oncology

## 2021-06-19 ENCOUNTER — Other Ambulatory Visit: Payer: BLUE CROSS/BLUE SHIELD

## 2021-06-19 ENCOUNTER — Telehealth: Payer: Self-pay | Admitting: *Deleted

## 2021-06-19 DIAGNOSIS — C716 Malignant neoplasm of cerebellum: Secondary | ICD-10-CM | POA: Diagnosis not present

## 2021-06-19 LAB — CBC WITH DIFFERENTIAL (CANCER CENTER ONLY)
Abs Immature Granulocytes: 0.01 10*3/uL (ref 0.00–0.07)
Basophils Absolute: 0 10*3/uL (ref 0.0–0.1)
Basophils Relative: 1 %
Eosinophils Absolute: 0.2 10*3/uL (ref 0.0–0.5)
Eosinophils Relative: 6 %
HCT: 38.9 % — ABNORMAL LOW (ref 39.0–52.0)
Hemoglobin: 14.3 g/dL (ref 13.0–17.0)
Immature Granulocytes: 0 %
Lymphocytes Relative: 29 %
Lymphs Abs: 0.8 10*3/uL (ref 0.7–4.0)
MCH: 31.6 pg (ref 26.0–34.0)
MCHC: 36.8 g/dL — ABNORMAL HIGH (ref 30.0–36.0)
MCV: 86.1 fL (ref 80.0–100.0)
Monocytes Absolute: 0.4 10*3/uL (ref 0.1–1.0)
Monocytes Relative: 15 %
Neutro Abs: 1.4 10*3/uL — ABNORMAL LOW (ref 1.7–7.7)
Neutrophils Relative %: 49 %
Platelet Count: 191 10*3/uL (ref 150–400)
RBC: 4.52 MIL/uL (ref 4.22–5.81)
RDW: 11.7 % (ref 11.5–15.5)
WBC Count: 2.8 10*3/uL — ABNORMAL LOW (ref 4.0–10.5)
nRBC: 0 % (ref 0.0–0.2)

## 2021-06-19 NOTE — Telephone Encounter (Signed)
CALLED PATIENT TO REMIND OF LAB APPT. FOR TODAY @ 10 AM, SPOKE WITH PATIENT AND HE IS AWARE OF THIS APPT.

## 2021-06-20 ENCOUNTER — Telehealth: Payer: Self-pay | Admitting: *Deleted

## 2021-06-20 NOTE — Telephone Encounter (Signed)
Spoke with the patient to let him know we received the results from his lab work and everything looks good.  He was informed that we would not need to repeat any labs unless he has signs or symptoms of infection.  He was encouraged to give [place] a call with any questions or concerns.  He verbalized understanding.  Gloriajean Dell. Leonie Green, BSN

## 2021-06-28 ENCOUNTER — Ambulatory Visit (HOSPITAL_COMMUNITY)
Admission: RE | Admit: 2021-06-28 | Discharge: 2021-06-28 | Disposition: A | Discharge status: Home / Self Care | Payer: BLUE CROSS/BLUE SHIELD | Source: Ambulatory Visit | Attending: Internal Medicine | Admitting: Internal Medicine

## 2021-06-28 DIAGNOSIS — C716 Malignant neoplasm of cerebellum: Secondary | ICD-10-CM | POA: Insufficient documentation

## 2021-06-28 MED ORDER — GADOBUTROL 1 MMOL/ML IV SOLN
10.0000 mL | Freq: Once | INTRAVENOUS | Status: AC | PRN
  Administered 2021-06-28: 10 mL via INTRAVENOUS

## 2021-07-01 ENCOUNTER — Other Ambulatory Visit: Payer: Self-pay

## 2021-07-01 ENCOUNTER — Inpatient Hospital Stay: Payer: BLUE CROSS/BLUE SHIELD | Attending: Internal Medicine | Admitting: Internal Medicine

## 2021-07-01 ENCOUNTER — Inpatient Hospital Stay: Payer: BLUE CROSS/BLUE SHIELD

## 2021-07-01 VITALS — BP 110/70 | HR 61 | Temp 98.1°F | Resp 17 | Wt 198.4 lb

## 2021-07-01 DIAGNOSIS — C716 Malignant neoplasm of cerebellum: Secondary | ICD-10-CM | POA: Diagnosis present

## 2021-07-01 DIAGNOSIS — R634 Abnormal weight loss: Secondary | ICD-10-CM | POA: Diagnosis not present

## 2021-07-01 DIAGNOSIS — Z923 Personal history of irradiation: Secondary | ICD-10-CM | POA: Diagnosis not present

## 2021-07-01 DIAGNOSIS — Z7952 Long term (current) use of systemic steroids: Secondary | ICD-10-CM | POA: Insufficient documentation

## 2021-07-01 DIAGNOSIS — R630 Anorexia: Secondary | ICD-10-CM | POA: Insufficient documentation

## 2021-07-01 NOTE — Progress Notes (Signed)
[place] at [address], [place] [address] [phone]   Interval Evaluation  Date of Service: 07/01/21 Patient Name: Scott Snyder Patient MRN: [id number] Patient DOB: [birth year] Provider: Ventura Sellers, MD  Identifying Statement:  Scott Snyder is a 26 y.o. male with posterior fossa  medulloblastoma, [facility]     Oncologic History: Oncology History  Medulloblastoma, adult ([place])  03/11/2021 Surgery   Sub-occipital craniotomy, resection by Dr. Zada Finders; path demonstrates medulloblastoma [facility]   03/20/2021 Surgery   Re-do craniotomy for residual tumor; result is gross total resection   04/24/2021 - 06/06/2021 Radiation Therapy   Craniospinal radiation with Dr. Lisbeth Renshaw, no concurrent chemo     Biomarkers:  [facility] PTCH1 mutant .  TP53 Wild type.  MYC pending  TERT "Mutated   Interval History: Scott Snyder presents today for follow up, now having completed full course of craniospinal irradiation for medulloblastoma.  Overall did well with radiation, aside from occassional nasuea.  Has lost some weight due to poor appetite, but this is improving gradually. No other new or progressive symptoms.  Remains active independent, managing fatigue well.  No explicit cognitive complaints.  H+P (04/04/21) Scott Snyder presented to medical attention last month with several weeks history of new onset headache syndrome.  He described daily AM headaches, increasing severity over time, not responsive to headache medications.  In days prior to ED visit, he began to experience gait difficulty and imbalance.  CNS imaging demonstrated large posterior fossa mass, which was resected in 2 craniotomy sessions with Dr. Zada Finders (most recent 03/20/21).  Since surgery he feels considerably improved, no headaches, no balance issues.  He is functionally intact, living at home, no steroids.  He presents today for path review and treatment planning.  Medications: Current  Outpatient Medications on File Prior to Visit  Medication Sig Dispense Refill   dexamethasone (DECADRON) 1 MG tablet Take 1 tablet (1 mg total) by mouth daily with breakfast. 60 tablet 1   LORazepam (ATIVAN) 0.5 MG tablet Take one tablet by mouth or dissolve under the tongue every 4 hours as needed for nausea or vomiting. 60 tablet 0   memantine (NAMENDA) 10 MG tablet Take 1 tablet (10 mg total) by mouth 2 (two) times daily. 60 tablet 4   memantine (NAMENDA) 5 MG tablet Begin this prescription the first day of brain radiation. Week 1: take 1 tablet by mouth every morning. Week 2: take 1 tablet every morning and evening. Week 3: take 2 tablets every morning, and 1 tablet every evening. Week 4: take 2 tablets every morning and evening. Then fill prescription for 85m tablets. (Patient not taking: Reported on 05/23/2021) 70 tablet 0   ondansetron (ZOFRAN) 8 MG tablet Take 1 tablet (8 mg total) by mouth in the morning, at noon, and at bedtime. 60 tablet 1   prochlorperazine (COMPAZINE) 10 MG tablet Take 1 tablet  by mouth every 8  hours as needed for nausea or vomiting. 60 tablet 0   No current facility-administered medications on file prior to visit.    Allergies: No Known Allergies Past Medical History:  Past Medical History:  Diagnosis Date   Headache    [place]ulloblastoma ([place] 02[place]   Past Surgical History:  Past Surgical History:  Procedure Laterality Date   APPLICATION OF CRANIAL NAVIGATION N/A 03/11/2021   Procedure: APPLICATION OF CRANIAL NAVIGATION;  Surgeon: OJudith Part MD;  [place]  Service: Neurosurgery;  Laterality: N/A;   APPLICATION OF  CRANIAL NAVIGATION N/A 03/20/2021   Procedure: APPLICATION OF CRANIAL NAVIGATION;  Surgeon: Judith Part, MD;  Location: [place];  Service: Neurosurgery;  Laterality: N/A;   SUBOCCIPITAL CRANIECTOMY CERVICAL LAMINECTOMY N/A 03/11/2021   Procedure: SUBOCCIPITAL CRANIECTOMY FOR TUMOR WITH VENTRICULAR DRAIN;  Surgeon: Judith Part, MD;  Location: [place];  Service: Neurosurgery;  Laterality: N/A;   SUBOCCIPITAL CRANIECTOMY CERVICAL LAMINECTOMY N/A 03/20/2021   Procedure: REDO SUBOCCIPITAL CRANIECTOMY FOR BRAIN TUMOR;  Surgeon: Judith Part, MD;  Location: [place];  Service: Neurosurgery;  Laterality: N/A;   Social History:  Social History   Socioeconomic History   Marital status: Single    Spouse name: Not on file   Number of children: Not on file   Years of education: Not on file   Highest education level: Not on file  Occupational History   Not on file  Tobacco Use   Smoking status: Never   Smokeless tobacco: Never  Vaping Use   Vaping Use: Never used  Substance and Sexual Activity   Alcohol use: Not Currently    Comment: socially   Drug use: Yes   Sexual activity: Yes    Birth control/protection: Condom  Other Topics Concern   Not on file  Social History Narrative   Not on file   Social Determinants of Health   Financial Resource Strain: Not on file  Food Insecurity: Not on file  Transportation Needs: Not on file  Physical Activity: Not on file  Stress: Not on file  Social Connections: Not on file  Intimate Partner Violence: Not on file   Family History:  Family History  Problem Relation Age of Onset   Other Brother    Thyroid cancer Maternal Aunt    Breast cancer Other    Thyroid cancer Other    Thyroid cancer Maternal Great-grandfather     Review of Systems: Constitutional: Doesn't report fevers, chills or abnormal weight loss Eyes: Doesn't report blurriness of vision Ears, nose, mouth, throat, and face: Doesn't report sore throat Respiratory: Doesn't report cough, dyspnea or wheezes Cardiovascular: Doesn't report palpitation, chest discomfort  Gastrointestinal:  Doesn't report nausea, constipation, diarrhea GU: Doesn't report incontinence Skin: Doesn't report skin rashes Neurological: Per HPI Musculoskeletal: Doesn't report joint pain Behavioral/Psych: Doesn't  report anxiety  Physical Exam: Vitals:   07/01/21 0908  BP: 110/70  Pulse: 61  Resp: 17  Temp: 98.1 F (36.7 C)  SpO2: 99%   KPS: 90. General: Alert, cooperative, pleasant, in no acute distress Head: Normal EENT: No conjunctival injection or scleral icterus.  Lungs: Resp effort normal Cardiac: Regular rate Abdomen: Non-distended abdomen Skin: No rashes cyanosis or petechiae. Extremities: No clubbing or edema  Neurologic Exam: Mental Status: Awake, alert, attentive to examiner. Oriented to self and environment. Language is fluent with intact comprehension.  Cranial Nerves: Visual acuity is grossly normal. Visual fields are full. Extra-ocular movements intact. No ptosis. Face is symmetric Motor: Tone and bulk are normal. Power is full in both arms and legs. Reflexes are symmetric, no pathologic reflexes present.  Sensory: Intact to light touch Gait: Normal.   Labs: I have reviewed the data as listed    Component Value Date/Time   NA 137 03/11/2021 0846   K 4.0 03/11/2021 0846   CL 104 03/10/2021 0439   CO2 19 (L) 03/10/2021 0439   GLUCOSE 130 (H) 03/10/2021 0439   BUN 23 (H) 03/10/2021 0439   CREATININE 0.97 03/20/2021 1217   CALCIUM 10.0 03/10/2021 0439   PROT  7.3 03/07/2021 0420   ALBUMIN 4.5 03/10/2021 0439   AST 14 (L) 03/07/2021 0420   ALT 14 03/07/2021 0420   ALKPHOS 40 03/07/2021 0420   BILITOT 1.0 03/07/2021 0420   GFRNONAA >60 03/20/2021 1217   GFRAA >60 07/12/2017 0433   Lab Results  Component Value Date   WBC 2.8 (L) 06/19/2021   NEUTROABS 1.4 (L) 06/19/2021   HGB 14.3 06/19/2021   HCT 38.9 (L) 06/19/2021   MCV 86.1 06/19/2021   PLT 191 06/19/2021    Imaging:  [place] Clinician Interpretation: I have personally reviewed the CNS images as listed.  My interpretation, in the context of the patient's clinical presentation, is stable disease  MR BRAIN W WO CONTRAST  Result Date: 06/29/2021 CLINICAL DATA:  History of medullary blastoma with  resection, assess treatment response. EXAM: MRI HEAD WITHOUT AND WITH CONTRAST TECHNIQUE: Multiplanar, multiecho pulse sequences of the brain and surrounding structures were obtained without and with intravenous contrast. CONTRAST:  36m GADAVIST GADOBUTROL 1 MMOL/ML IV SOLN COMPARISON:  03/20/2021 FINDINGS: Brain: Left cerebellar resection site is collapsed with no worrisome enhancement, restricted diffusion, or mass effect. No evidence of leptomeningeal spread. Expected sequela of right-sided ventriculostomy. No infarct, hemorrhage, hydrocephalus, or collection. Brain volume is normal Vascular: Normal flow voids and vessel enhancements Skull and upper cervical spine: Unremarkable left occipital craniotomy site Sinuses/Orbits: Right-sided sinusitis with completely opacified sinuses and pattern of middle meatus obstruction. IMPRESSION: 1. No evidence of residual or recurrent disease. 2. Right middle meatus obstruction and sinusitis. Electronically Signed   By: JJorje GuildM.D.   On: 06/29/2021 04:51    Assessment/Plan Medulloblastoma, adult ([place]  ENestor Rampis clinically stable and radiographically stable, now having completed full course and boost craniospinal radiation therapy.  CARIS profile confirms SChelseagenotype with no TP53 mutation.    Clinical, radiographic, genetic profile and staging remain consistent with low or standard risk medulloblastoma.    We discussed lack of certainty regarding role of adjuvant chemotherapy in adults.  There is no good prospective data, but certain retrospective data may suggest mortality benefit with chemotherapy.    Because of good clinical function, lack of any visible disease, completion of high dose CSI, and risks of chemotherapy exposure, it would be reasonable to transition to serial imaging monitoring at this time.  He is agreeable with this plan today.  Will also provide referral for social work, given insurance coverage and return to work  issues.  We ask that EBrylee Mcgrealreturn to clinic in 3 months with MRI brain for evaluation, or sooner as needed.  If stable, will continue imaging on 3 month intervals for the first year.  Spine study should be included 9 months from now.  All questions were answered. The patient knows to call the clinic with any problems, questions or concerns. No barriers to learning were detected.  The total time spent in the encounter was 40 minutes and more than 50% was on counseling and review of test results   ZVentura Sellers MD Medical Director of Neuro-Oncology [facility]ark [facility]01/16/23 9:05 AM

## 2021-07-02 ENCOUNTER — Telehealth: Payer: Self-pay | Admitting: Internal Medicine

## 2021-07-02 ENCOUNTER — Encounter: Payer: Self-pay | Admitting: Licensed Clinical Social Worker

## 2021-07-02 NOTE — Telephone Encounter (Signed)
Left message with follow-up appointment per 1/16 los. °

## 2021-07-02 NOTE — Progress Notes (Signed)
[place] Work  Clinical Social Work was referred by Dr. Mickeal Skinner for assessment of psychosocial needs.  Clinical Social Worker contacted patient by phone  to offer support and assess for needs.  CSW left voicemail for patient with contact information and request for a return call.      Adelene Amas, [place]       First attempt

## 2021-07-03 ENCOUNTER — Encounter: Payer: Self-pay | Admitting: *Deleted

## 2021-07-03 NOTE — Progress Notes (Signed)
[place] Work   Clinical Social Work was referred by Dr. Mickeal Skinner for assessment of psychosocial needs.  Clinical Social Worker contacted patient by phone  to offer support and assess for needs.  CSW left voicemail for patient with contact information and request for a return call. This is second attempt.         Maryjean Morn, MSW, LCSW, OSW-C Clinical Social Worker [facility] [phone]

## 2021-07-08 ENCOUNTER — Encounter: Payer: Self-pay | Admitting: Licensed Clinical Social Worker

## 2021-07-08 NOTE — Progress Notes (Signed)
[place] CSW Progress Note  Clinical Social Worker contacted patient by phone to follow-up on referral. CSW spoke with patient who had question about next steps once he is no longer on his parents insurance in May.  CSW gave the patient information for [email or web address], [place] and [facility], for possible continuation of healthcare needs.  CSW and patient spoke about benefits in [facility] system and CSW encouraged patient to look up information in the [email or web address] website, and contacting HR Dept. CSW recommended patient contact me if he has any other questions or concerns.  Patient verbalized understanding.    Adelene Amas , LCSW

## 2021-09-17 ENCOUNTER — Telehealth: Payer: Self-pay

## 2021-09-17 NOTE — Telephone Encounter (Signed)
Left voicemail for pt so expected scan by 10/04/2021 can get scheduled. Office number [phone] was given for callback. ?

## 2021-09-23 ENCOUNTER — Other Ambulatory Visit: Payer: Self-pay | Admitting: Radiation Therapy

## 2021-10-03 ENCOUNTER — Encounter (HOSPITAL_COMMUNITY): Payer: Self-pay

## 2021-10-03 ENCOUNTER — Ambulatory Visit (HOSPITAL_COMMUNITY): Admission: RE | Admit: 2021-10-03 | Payer: 59 | Source: Ambulatory Visit

## 2021-10-07 ENCOUNTER — Inpatient Hospital Stay: Payer: 59 | Attending: Internal Medicine

## 2021-10-07 ENCOUNTER — Inpatient Hospital Stay: Payer: 59 | Admitting: Internal Medicine

## 2021-10-07 ENCOUNTER — Telehealth: Payer: Self-pay | Admitting: Internal Medicine

## 2021-10-07 ENCOUNTER — Other Ambulatory Visit: Payer: Self-pay | Admitting: Radiation Therapy

## 2021-10-07 NOTE — Telephone Encounter (Signed)
Per 4/24 in basket called and spoke to pt about appointment.  Pt confirmed appointment  ?

## 2021-10-11 ENCOUNTER — Ambulatory Visit (HOSPITAL_COMMUNITY)
Admission: RE | Admit: 2021-10-11 | Discharge: 2021-10-11 | Disposition: A | Discharge status: Home / Self Care | Payer: BLUE CROSS/BLUE SHIELD | Source: Ambulatory Visit | Attending: Internal Medicine | Admitting: Internal Medicine

## 2021-10-11 DIAGNOSIS — C716 Malignant neoplasm of cerebellum: Secondary | ICD-10-CM | POA: Diagnosis not present

## 2021-10-11 IMAGING — MR MR HEAD WO/W CM
13 series · 48 of 48 positions shown · IV contrast (gadavist)
Comparison: MRI head [DATE].

CLINICAL DATA: Brain/CNS neoplasm, assess treatment response

EXAM:
MRI HEAD WITHOUT AND WITH CONTRAST
TECHNIQUE: Multiplanar, multiecho pulse sequences of the brain and surrounding
structures were obtained without and with intravenous contrast.
CONTRAST:  9mL GADAVIST GADOBUTROL 1 MMOL/ML IV SOLN

[Series 5: DWI · axial · 3.0mm · 1.36mm/px · z∈[-73,+80]mm · 7 of 103 slices shown (1 of 2)]
[im 1/103]
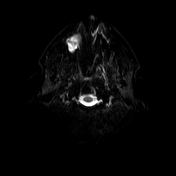
[im 18/103]
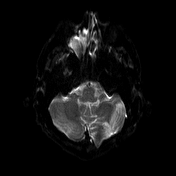
[im 35/103]
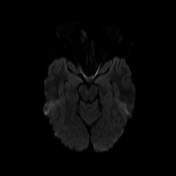
[im 52/103]
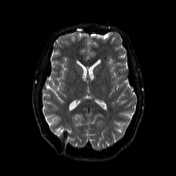
[im 69/103]
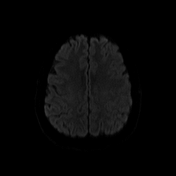
[im 86/103]
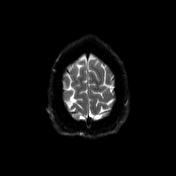
[im 103/103]
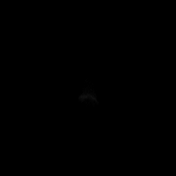

[Series 6: DWI · axial · 3.0mm · 1.36mm/px · z∈[-73,+80]mm · 3 of 52 slices shown (2 of 2)]
[im 1/52]
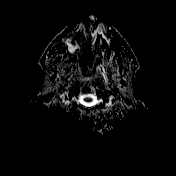
[im 26/52]
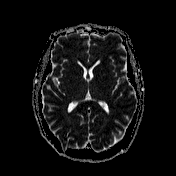
[im 52/52]
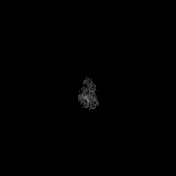

[Series 7: T1 · sagittal · 5.0mm · 0.75mm/px · 1 of 24 slices shown (1 of 2)]
[im 1/24]
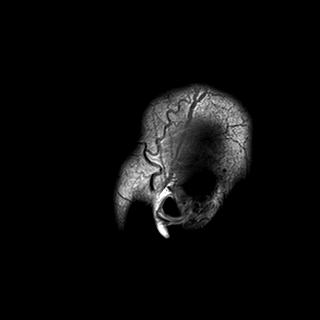

[Series 8: T2 · axial · 5.0mm · 0.62mm/px · 1 of 26 slices shown]
[im 1/26]
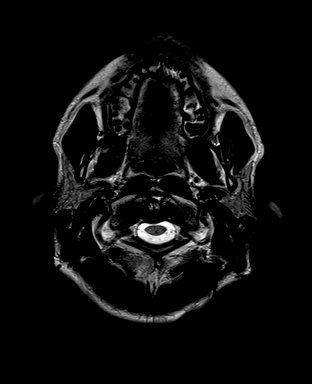

[Series 9: swi_images · axial · 3.0mm · 0.75mm/px · z∈[-84,+80]mm · 3 of 56 slices shown]
[im 1/56]
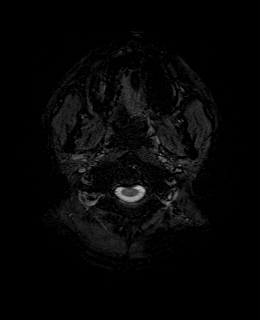
[im 28/56]
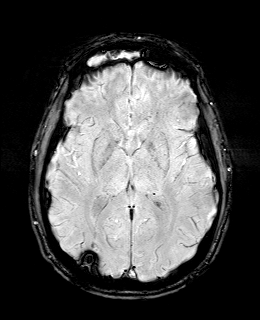
[im 56/56]
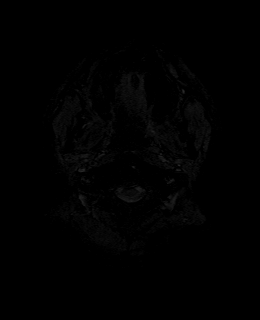

[Series 11: FLAIR · axial · 3.0mm · 0.75mm/px · z∈[-78,+74]mm · 3 of 52 slices shown]
[im 1/52]
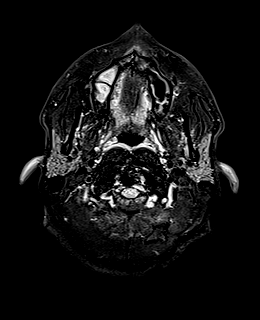
[im 26/52]
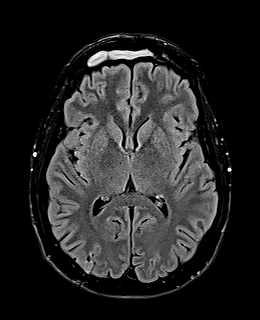
[im 52/52]
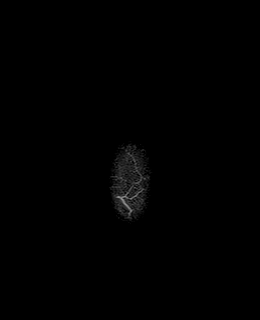

[Series 12: T1 · axial · 1.0mm · 0.94mm/px · z∈[-93,+82]mm · 10 of 176 slices shown (2 of 2)]
[im 1/176]
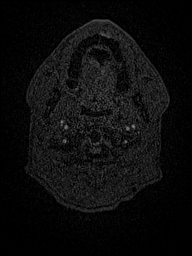
[im 20/176]
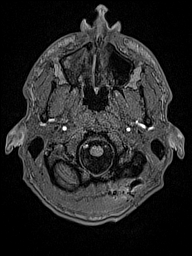
[im 39/176]
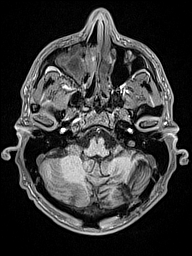
[im 59/176]
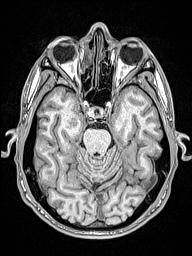
[im 78/176]
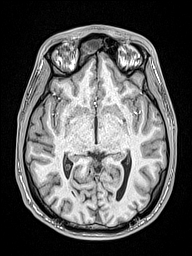
[im 98/176]
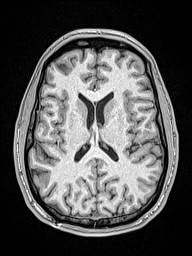
[im 117/176]
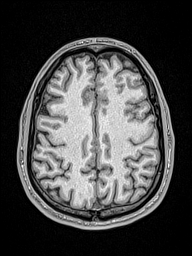
[im 137/176]
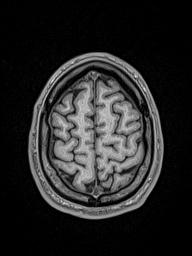
[im 156/176]
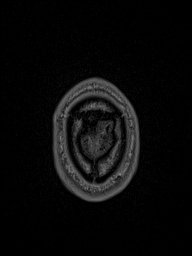
[im 176/176]
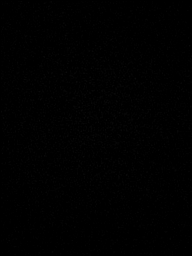

[Series 13: cor dwi_tracew · coronal · 5.0mm · 1.53mm/px · 3 of 54 slices shown]
[im 1/54]
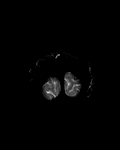
[im 27/54]
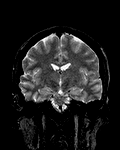
[im 54/54]
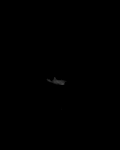

[Series 14: cor dwi_adc · coronal · 5.0mm · 1.53mm/px · 2 of 27 slices shown]
[im 1/27]
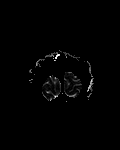
[im 27/27]
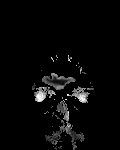

[Series 15: T2 post-contrast · coronal · 5.0mm · 0.57mm/px · 2 of 32 slices shown]
[im 1/32]
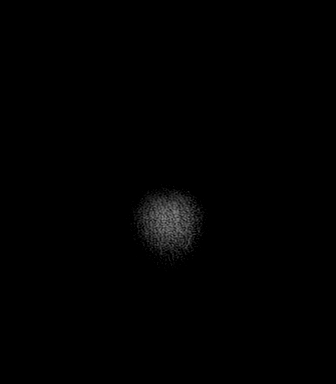
[im 32/32]
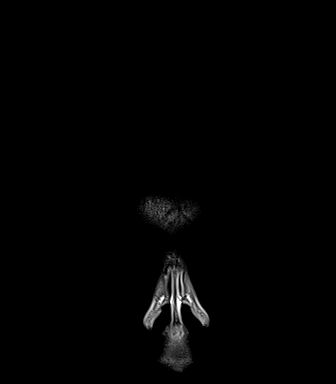

[Series 16: T1 post-contrast · axial · 1.0mm · 0.94mm/px · z∈[-93,+82]mm · 10 of 176 slices shown (1 of 3)]
[im 1/176]
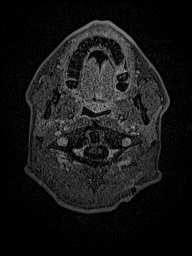
[im 20/176]
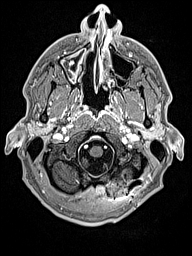
[im 39/176]
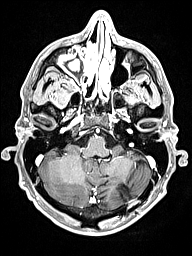
[im 59/176]
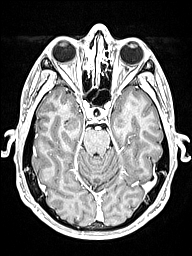
[im 78/176]
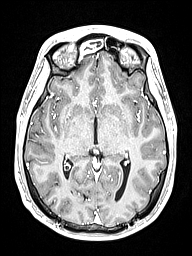
[im 98/176]
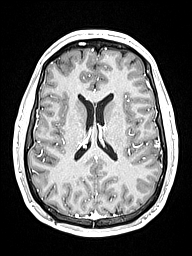
[im 117/176]
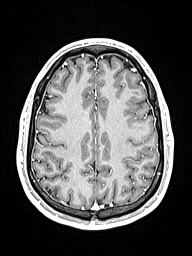
[im 137/176]
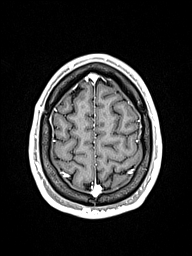
[im 156/176]
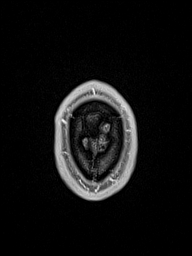
[im 176/176]
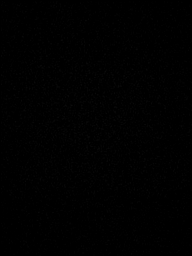

[Series 17: T1 post-contrast · coronal · 5.0mm · 0.43mm/px · 2 of 32 slices shown (2 of 3)]
[im 1/32]
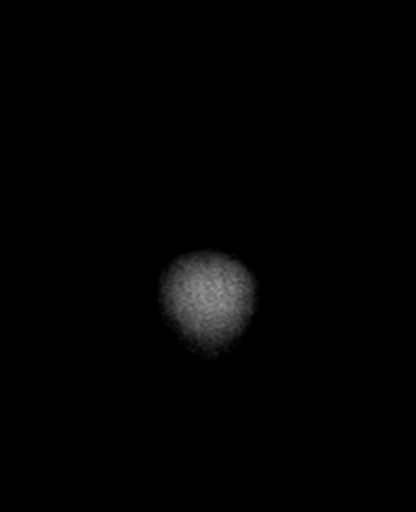
[im 32/32]
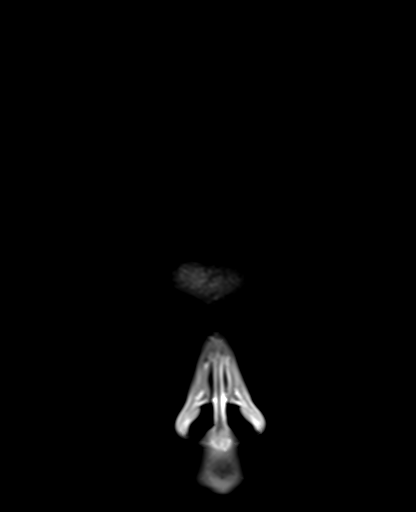

[Series 18: T1 post-contrast · sagittal · 5.0mm · 0.75mm/px · 1 of 24 slices shown (3 of 3)]
[im 1/24]
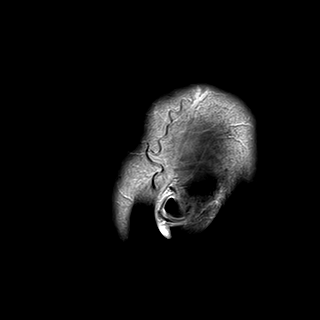

[48 of 48 positions shown; findings below may reference images not displayed]

FINDINGS: Brain: Similar appearance of the left cerebellar resection slight.
No evidence of masslike enhancement or restricted diffusion this
region to suggest residual tumor. Similar mild surrounding gliosis.
No evidence of acute infarct, acute hemorrhage, mass lesion, midline
shift, hydrocephalus, or extra-axial fluid collection. Similar
sequela of prior right-sided ventriculostomy.

Vascular: Major arterial flow voids are maintained at the skull
base.

Skull and upper cervical spine: Left occipital craniotomy.

Sinuses/Orbits: Similar right-sided sinusitis with right ostiomeatal
unit pattern sinus opacification and enhancement.

Other: No sizable mastoid effusions
IMPRESSION: 1. No evidence of residual recurrent disease.
2. Similar right ostiomeatal unit pattern sinusitis.

## 2021-10-11 MED ORDER — GADOBUTROL 1 MMOL/ML IV SOLN
9.0000 mL | Freq: Once | INTRAVENOUS | Status: AC | PRN
  Administered 2021-10-11: 9 mL via INTRAVENOUS

## 2021-10-14 ENCOUNTER — Other Ambulatory Visit: Payer: Self-pay

## 2021-10-14 ENCOUNTER — Inpatient Hospital Stay (HOSPITAL_BASED_OUTPATIENT_CLINIC_OR_DEPARTMENT_OTHER): Payer: 59 | Admitting: Internal Medicine

## 2021-10-14 ENCOUNTER — Inpatient Hospital Stay: Payer: 59 | Attending: Internal Medicine

## 2021-10-14 VITALS — BP 118/73 | HR 67 | Temp 97.7°F | Resp 20 | Wt 186.7 lb

## 2021-10-14 DIAGNOSIS — Z923 Personal history of irradiation: Secondary | ICD-10-CM | POA: Diagnosis not present

## 2021-10-14 DIAGNOSIS — C716 Malignant neoplasm of cerebellum: Secondary | ICD-10-CM | POA: Diagnosis not present

## 2021-10-14 NOTE — Progress Notes (Signed)
? ?[place] at [address]  ?[place], [place] [address] ?[phone] ? ? ?Interval Evaluation ? ?Date of Service: 10/14/21 ?Patient Name: Scott Snyder ?Patient MRN: [id number] ?Patient DOB: 03-19-1996 ?Provider: Ventura Sellers, MD ? ?Identifying Statement:  ?Scott Snyder is a 26 y.o. male with posterior fossa  medulloblastoma, [facility]+   ? ? ?Oncologic History: ?Oncology History  ?Medulloblastoma, adult [facility])  ?03/11/2021 Surgery  ? Sub-occipital craniotomy, resection by Dr. Zada Finders; path demonstrates medulloblastoma [facility]+ ?  ?03/20/2021 Surgery  ? Re-do craniotomy for residual tumor; result is gross total resection ?  ?04/24/2021 - 06/06/2021 Radiation Therapy  ? Craniospinal radiation with Dr. Lisbeth Renshaw, no concurrent chemo ?  ? ? ?Biomarkers: ? ?[facility] PTCH1 mutant .  ?TP53 Wild type.  ?[place] pending  ?TERT "Mutated  ? ?Interval History: Scott Snyder presents today for follow up with MRI brain for evaluation.  Denies new or progressive complaints today.  Remains active independent, managing fatigue well.  No explicit cognitive complaints.  Mild weight loss continues but his appetite is normal or even increased in recent weeks. ? ?H+P (04/04/21) Scott Snyder presented to medical attention last month with several weeks history of new onset headache syndrome.  He described daily AM headaches, increasing severity over time, not responsive to headache medications.  In days prior to ED visit, he began to experience gait difficulty and imbalance.  CNS imaging demonstrated large posterior fossa mass, which was resected in 2 craniotomy sessions with Dr. Zada Finders (most recent 03/20/21).  Since surgery he feels considerably improved, no headaches, no balance issues.  He is functionally intact, living at home, no steroids.  He presents today for path review and treatment planning. ? ?Medications: ?Current Outpatient Medications on File Prior to Visit  ?Medication Sig Dispense Refill  ? LORazepam (ATIVAN)  0.5 MG tablet Take one tablet by mouth or dissolve under the tongue every 4 hours as needed for nausea or vomiting. 60 tablet 0  ? ?No current facility-administered medications on file prior to visit.  ? ? ?Allergies: No Known Allergies ?Past Medical History:  ?Past Medical History:  ?Diagnosis Date  ? Headache   ? Medulloblastoma ([place]) 02/2021  ? ?Past Surgical History:  ?Past Surgical History:  ?Procedure Laterality Date  ? APPLICATION OF CRANIAL NAVIGATION N/A 03/11/2021  ? Procedure: APPLICATION OF CRANIAL NAVIGATION;  Surgeon: Judith Part, MD;  Location: [place];  Service: Neurosurgery;  Laterality: N/A;  ? APPLICATION OF CRANIAL NAVIGATION N/A 03/20/2021  ? Procedure: APPLICATION OF CRANIAL NAVIGATION;  Surgeon: Judith Part, MD;  Location: [place];  Service: Neurosurgery;  Laterality: N/A;  ? SUBOCCIPITAL CRANIECTOMY CERVICAL LAMINECTOMY N/A 03/11/2021  ? Procedure: SUBOCCIPITAL CRANIECTOMY FOR TUMOR WITH VENTRICULAR DRAIN;  Surgeon: Judith Part, MD;  Location: [place];  Service: Neurosurgery;  Laterality: N/A;  ? SUBOCCIPITAL CRANIECTOMY CERVICAL LAMINECTOMY N/A 03/20/2021  ? Procedure: REDO SUBOCCIPITAL CRANIECTOMY FOR BRAIN TUMOR;  Surgeon: Judith Part, MD;  Location: [place];  Service: Neurosurgery;  Laterality: N/A;  ? ?Social History:  ?Social History  ? ?Socioeconomic History  ? Marital status: Single  ?  Spouse name: Not on file  ? Number of children: Not on file  ? Years of education: Not on file  ? Highest education level: Not on file  ?Occupational History  ? Not on file  ?Tobacco Use  ? Smoking status: Never  ? Smokeless tobacco: Never  ?Vaping Use  ? Vaping Use: Never used  ?Substance and Sexual Activity  ?  Alcohol use: Not Currently  ?  Comment: socially  ? Drug use: Yes  ? Sexual activity: Yes  ?  Birth control/protection: Condom  ?Other Topics Concern  ? Not on file  ?Social History Narrative  ? Not on file  ? ?Social Determinants of Health  ? ?Financial Resource Strain: Not  on file  ?Food Insecurity: Not on file  ?Transportation Needs: Not on file  ?Physical Activity: Not on file  ?Stress: Not on file  ?Social Connections: Not on file  ?Intimate Partner Violence: Not on file  ? ?Family History:  ?Family History  ?Problem Relation Age of Onset  ? Other Brother   ? Thyroid cancer Maternal Aunt   ? Breast cancer Other   ? Thyroid cancer Other   ? Thyroid cancer Maternal Great-grandfather   ? ? ?Review of Systems: ?Constitutional: Doesn't report fevers, chills or abnormal weight loss ?Eyes: Doesn't report blurriness of vision ?Ears, nose, mouth, throat, and face: Doesn't report sore throat ?Respiratory: Doesn't report cough, dyspnea or wheezes ?Cardiovascular: Doesn't report palpitation, chest discomfort  ?Gastrointestinal:  Doesn't report nausea, constipation, diarrhea ?GU: Doesn't report incontinence ?Skin: Doesn't report skin rashes ?Neurological: Per HPI ?Musculoskeletal: Doesn't report joint pain ?Behavioral/Psych: Doesn't report anxiety ? ?Physical Exam: ?Vitals:  ? 10/14/21 0903  ?BP: 118/73  ?Pulse: 67  ?Resp: 20  ?Temp: 97.7 ?F (36.5 ?C)  ?SpO2: 98%  ? ?KPS: 90. ?General: Alert, cooperative, pleasant, in no acute distress ?Head: Normal ?EENT: No conjunctival injection or scleral icterus.  ?Lungs: Resp effort normal ?Cardiac: Regular rate ?Abdomen: Non-distended abdomen ?Skin: No rashes cyanosis or petechiae. ?Extremities: No clubbing or edema ? ?Neurologic Exam: ?Mental Status: Awake, alert, attentive to examiner. Oriented to self and environment. Language is fluent with intact comprehension.  ?Cranial Nerves: Visual acuity is grossly normal. Visual fields are full. Extra-ocular movements intact. No ptosis. Face is symmetric ?Motor: Tone and bulk are normal. Power is full in both arms and legs. Reflexes are symmetric, no pathologic reflexes present.  ?Sensory: Intact to light touch ?Gait: Normal. ? ? ?Labs: ?I have reviewed the data as listed ?   ?Component Value Date/Time  ? NA  137 03/11/2021 0846  ? K 4.0 03/11/2021 0846  ? CL 104 03/10/2021 0439  ? CO2 19 (L) 03/10/2021 0439  ? GLUCOSE 130 (H) 03/10/2021 0439  ? BUN 23 (H) 03/10/2021 0439  ? CREATININE 0.97 03/20/2021 1217  ? CALCIUM 10.0 03/10/2021 0439  ? PROT 7.3 03/07/2021 0420  ? ALBUMIN 4.5 03/10/2021 0439  ? AST 14 (L) 03/07/2021 0420  ? ALT 14 03/07/2021 0420  ? ALKPHOS 40 03/07/2021 0420  ? BILITOT 1.0 03/07/2021 0420  ? GFRNONAA >60 03/20/2021 1217  ? GFRAA >60 07/12/2017 0433  ? ?Lab Results  ?Component Value Date  ? WBC 2.8 (L) 06/19/2021  ? NEUTROABS 1.4 (L) 06/19/2021  ? HGB 14.3 06/19/2021  ? HCT 38.9 (L) 06/19/2021  ? MCV 86.1 06/19/2021  ? PLT 191 06/19/2021  ? ? ?Imaging: ? ?[place] Clinician Interpretation: I have personally reviewed the CNS images as listed.  My interpretation, in the context of the patient's clinical presentation, is stable disease ? ?MR BRAIN W WO CONTRAST ? ?Result Date: 10/11/2021 ?CLINICAL DATA:  Brain/CNS neoplasm, assess treatment response EXAM: MRI HEAD WITHOUT AND WITH CONTRAST TECHNIQUE: Multiplanar, multiecho pulse sequences of the brain and surrounding structures were obtained without and with intravenous contrast. CONTRAST:  11mL GADAVIST GADOBUTROL 1 MMOL/ML IV SOLN COMPARISON:  MRI head 06/28/2021. FINDINGS: Brain: Similar appearance  of the left cerebellar resection slight. No evidence of masslike enhancement or restricted diffusion this region to suggest residual tumor. Similar mild surrounding gliosis. No evidence of acute infarct, acute hemorrhage, mass lesion, midline shift, hydrocephalus, or extra-axial fluid collection. Similar sequela of prior right-sided ventriculostomy. Vascular: Major arterial flow voids are maintained at the skull base. Skull and upper cervical spine: Left occipital craniotomy. Sinuses/Orbits: Similar right-sided sinusitis with right ostiomeatal unit pattern sinus opacification and enhancement. Other: No sizable mastoid effusions IMPRESSION: 1. No evidence of  residual recurrent disease. 2. Similar right ostiomeatal unit pattern sinusitis. Electronically Signed   By: Margaretha Sheffield M.D.   On: 10/11/2021 08:28   ? ?Assessment/Plan ?Medulloblastoma, adult [facility]

## 2022-01-13 ENCOUNTER — Other Ambulatory Visit: Payer: Self-pay | Admitting: Radiation Therapy

## 2022-01-31 ENCOUNTER — Ambulatory Visit (HOSPITAL_COMMUNITY)
Admission: RE | Admit: 2022-01-31 | Discharge: 2022-01-31 | Disposition: A | Discharge status: Home / Self Care | Payer: 59 | Source: Ambulatory Visit | Attending: Internal Medicine | Admitting: Internal Medicine

## 2022-01-31 DIAGNOSIS — C716 Malignant neoplasm of cerebellum: Secondary | ICD-10-CM | POA: Diagnosis present

## 2022-01-31 MED ORDER — GADOBUTROL 1 MMOL/ML IV SOLN
9.0000 mL | Freq: Once | INTRAVENOUS | Status: AC | PRN
  Administered 2022-01-31: 9 mL via INTRAVENOUS

## 2022-02-03 ENCOUNTER — Inpatient Hospital Stay: Payer: 59 | Attending: Internal Medicine | Admitting: Internal Medicine

## 2022-02-03 ENCOUNTER — Inpatient Hospital Stay: Payer: 59

## 2022-02-03 ENCOUNTER — Other Ambulatory Visit: Payer: Self-pay

## 2022-02-03 VITALS — BP 125/78 | HR 62 | Temp 98.4°F | Resp 15 | Wt 186.5 lb

## 2022-02-03 DIAGNOSIS — Z923 Personal history of irradiation: Secondary | ICD-10-CM | POA: Insufficient documentation

## 2022-02-03 DIAGNOSIS — Z808 Family history of malignant neoplasm of other organs or systems: Secondary | ICD-10-CM | POA: Diagnosis not present

## 2022-02-03 DIAGNOSIS — C716 Malignant neoplasm of cerebellum: Secondary | ICD-10-CM | POA: Diagnosis present

## 2022-02-03 NOTE — Progress Notes (Signed)
[facility] [place], [place] [address] [phone]   Interval Evaluation  Date of Service: 02/03/22 Patient Name: Scott Snyder Patient MRN: [id number] Patient DOB: 1995/12/18 Provider: Ventura Sellers, MD  Identifying Statement:  Scott Snyder is a 26 y.o. male with posterior fossa  medulloblastoma, [facility]     Oncologic History: Oncology History  Medulloblastoma, adult ([place])  03/11/2021 Surgery   Sub-occipital craniotomy, resection by Dr. Zada Finders; path demonstrates medulloblastoma [facility]   03/20/2021 Surgery   Re-do craniotomy for residual tumor; result is gross total resection   04/24/2021 - 06/06/2021 Radiation Therapy   Craniospinal radiation with Dr. Lisbeth Renshaw, no concurrent chemo     Biomarkers:  [facility] PTCH1 mutant .  TP53 Wild type.  MYC pending  TERT "Mutated   Interval History: Scott Snyder presents today for follow up with MRI brain for evaluation.  No clinical changes todasy.  Remains active independent, managing fatigue well.  No explicit cognitive complaints.  Working as Hotel manager.  H+P (04/04/21) Scott Snyder presented to medical attention last month with several weeks history of new onset headache syndrome.  He described daily AM headaches, increasing severity over time, not responsive to headache medications.  In days prior to ED visit, he began to experience gait difficulty and imbalance.  CNS imaging demonstrated large posterior fossa mass, which was resected in 2 craniotomy sessions with Dr. Zada Finders (most recent 03/20/21).  Since surgery he feels considerably improved, no headaches, no balance issues.  He is functionally intact, living at home, no steroids.  He presents today for path review and treatment planning.  Medications: No current outpatient medications on file prior to visit.   No current facility-administered medications on file prior to visit.    Allergies: No Known  Allergies Past Medical History:  Past Medical History:  Diagnosis Date   Headache    Medulloblastoma ([place]) 02/2021   Past Surgical History:  Past Surgical History:  Procedure Laterality Date   APPLICATION OF CRANIAL NAVIGATION N/A 03/11/2021   Procedure: APPLICATION OF CRANIAL NAVIGATION;  Surgeon: Judith Part, MD;  Location: [place];  Service: Neurosurgery;  Laterality: N/A;   APPLICATION OF CRANIAL NAVIGATION N/A 03/20/2021   Procedure: APPLICATION OF CRANIAL NAVIGATION;  Surgeon: Judith Part, MD;  Location: [place];  Service: Neurosurgery;  Laterality: N/A;   SUBOCCIPITAL CRANIECTOMY CERVICAL LAMINECTOMY N/A 03/11/2021   Procedure: SUBOCCIPITAL CRANIECTOMY FOR TUMOR WITH VENTRICULAR DRAIN;  Surgeon: Judith Part, MD;  Location: [place];  Service: Neurosurgery;  Laterality: N/A;   SUBOCCIPITAL CRANIECTOMY CERVICAL LAMINECTOMY N/A 03/20/2021   Procedure: REDO SUBOCCIPITAL CRANIECTOMY FOR BRAIN TUMOR;  Surgeon: Judith Part, MD;  Location: [place];  Service: Neurosurgery;  Laterality: N/A;   Social History:  Social History   Socioeconomic History   Marital status: Single    Spouse name: Not on file   Number of children: Not on file   Years of education: Not on file   Highest education level: Not on file  Occupational History   Not on file  Tobacco Use   Smoking status: Never   Smokeless tobacco: Never  Vaping Use   Vaping Use: Never used  Substance and Sexual Activity   Alcohol use: Not Currently    Comment: socially   Drug use: Yes   Sexual activity: Yes    Birth control/protection: Condom  Other Topics Concern   Not on file  Social History Narrative   Not  on file   Social Determinants of Health   Financial Resource Strain: Not on file  Food Insecurity: Not on file  Transportation Needs: Not on file  Physical Activity: Not on file  Stress: Not on file  Social Connections: Not on file  Intimate Partner Violence: Not on file   Family History:   Family History  Problem Relation Age of Onset   Other Brother    Thyroid cancer Maternal Aunt    Breast cancer Other    Thyroid cancer Other    Thyroid cancer Maternal Great-grandfather     Review of Systems: Constitutional: Doesn't report fevers, chills or abnormal weight loss Eyes: Doesn't report blurriness of vision Ears, nose, mouth, throat, and face: Doesn't report sore throat Respiratory: Doesn't report cough, dyspnea or wheezes Cardiovascular: Doesn't report palpitation, chest discomfort  Gastrointestinal:  Doesn't report nausea, constipation, diarrhea GU: Doesn't report incontinence Skin: Doesn't report skin rashes Neurological: Per HPI Musculoskeletal: Doesn't report joint pain Behavioral/Psych: Doesn't report anxiety  Physical Exam: Vitals:   02/03/22 0854  BP: 125/78  Pulse: 62  Resp: 15  Temp: 98.4 F (36.9 C)  SpO2: 100%   KPS: 90. General: Alert, cooperative, pleasant, in no acute distress Head: Normal EENT: No conjunctival injection or scleral icterus.  Lungs: Resp effort normal Cardiac: Regular rate Abdomen: Non-distended abdomen Skin: No rashes cyanosis or petechiae. Extremities: No clubbing or edema  Neurologic Exam: Mental Status: Awake, alert, attentive to examiner. Oriented to self and environment. Language is fluent with intact comprehension.  Cranial Nerves: Visual acuity is grossly normal. Visual fields are full. Extra-ocular movements intact. No ptosis. Face is symmetric Motor: Tone and bulk are normal. Power is full in both arms and legs. Reflexes are symmetric, no pathologic reflexes present.  Sensory: Intact to light touch Gait: Normal.   Labs: I have reviewed the data as listed    Component Value Date/Time   NA 137 03/11/2021 0846   K 4.0 03/11/2021 0846   CL 104 03/10/2021 0439   CO2 19 (L) 03/10/2021 0439   GLUCOSE 130 (H) 03/10/2021 0439   BUN 23 (H) 03/10/2021 0439   CREATININE 0.97 03/20/2021 1217   CALCIUM 10.0  03/10/2021 0439   PROT 7.3 03/07/2021 0420   ALBUMIN 4.5 03/10/2021 0439   AST 14 (L) 03/07/2021 0420   ALT 14 03/07/2021 0420   ALKPHOS 40 03/07/2021 0420   BILITOT 1.0 03/07/2021 0420   GFRNONAA >60 03/20/2021 1217   GFRAA >60 07/12/2017 0433   Lab Results  Component Value Date   WBC 2.8 (L) 06/19/2021   NEUTROABS 1.4 (L) 06/19/2021   HGB 14.3 06/19/2021   HCT 38.9 (L) 06/19/2021   MCV 86.1 06/19/2021   PLT 191 06/19/2021    Imaging:  [place] Clinician Interpretation: I have personally reviewed the CNS images as listed.  My interpretation, in the context of the patient's clinical presentation, is stable disease  MR BRAIN W WO CONTRAST  Result Date: 02/02/2022 CLINICAL DATA:  26 year old male with a history of posterior fossa medulloblastoma status post surgery and postop craniospinal restaging. Radiation in 2022 EXAM: MRI HEAD WITHOUT AND WITH CONTRAST TECHNIQUE: Multiplanar, multiecho pulse sequences of the brain and surrounding structures were obtained without and with intravenous contrast. CONTRAST:  45mL GADAVIST GADOBUTROL 1 MMOL/ML IV SOLN COMPARISON:  Brain MRI 10/11/2021 and earlier. FINDINGS: Brain: Left cerebellar postoperative resection cavity is stable and size and configuration. No postcontrast enhancement or regional mass effect. However, anterior marginal T2/FLAIR hyperintensity has mildly increased since  January this year, and is also slightly more conspicuous since April (series 11, image 13). No suspicious DWI changes. Underlying hemosiderin is stable. Right posterior hemisphere post ventriculostomy changes redemonstrated and stable. No abnormal enhancement identified. No dural thickening. No restricted diffusion to suggest acute infarction. No midline shift, mass effect, ventriculomegaly, or acute intracranial hemorrhage. Cervicomedullary junction and pituitary are within normal limits. Vascular: Major intracranial vascular flow voids are stable. Following contrast major  dural venous sinuses are enhancing and appear to be patent. 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 Negative visible spinal cord. Visualized bone marrow signal is within normal limits. 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 Orbits remain negative. Other: Mastoids are clear. Visible internal auditory structures appear normal. Stable postoperative changes to the posterior scalp. IMPRESSION: 1. Post cerebellar resection cavity with no abnormal enhancement. Mildly increased anterior margin T2/FLAIR hyperintensity. Favor treatment effect. Recommend continued surveillance. 2. No new intracranial abnormality. Electronically Signed   By: Genevie Ann M.D.   On: 02/02/2022 15:09    Assessment/Plan Medulloblastoma, adult ([place])  Scott Snyder is clinically and radiographically stable, now 9 months removed for CSI radiation for adult medulloblastoma SHH+/TP53-.    Clinical, radiographic, genetic profile and staging remain consistent with low or standard risk medulloblastoma.    Brain MRI demonstrates modest FLAIR changes which we suspect are 2/2 post-RT leukomalacia.  We recommended continued close imaging surveillance at this time.  We ask that Scott Snyder return to clinic in 3 months with MRI brain for evaluation, or sooner as needed.  Spine study can be done as well in the fall.  All questions were answered. The patient knows to call the clinic with any problems, questions or concerns. No barriers to learning were detected.  The total time spent in the encounter was 30 minutes and more than 50% was on counseling and review of test results   Ventura Sellers, MD Medical Director of Neuro-Oncology [facility] at [place] 02/03/22 8:57 AM

## 2022-02-06 ENCOUNTER — Other Ambulatory Visit: Payer: Self-pay | Admitting: Radiation Therapy

## 2022-05-16 ENCOUNTER — Ambulatory Visit (HOSPITAL_COMMUNITY)
Admission: RE | Admit: 2022-05-16 | Discharge: 2022-05-16 | Disposition: A | Discharge status: Home / Self Care | Payer: 59 | Source: Ambulatory Visit | Attending: Internal Medicine | Admitting: Internal Medicine

## 2022-05-16 DIAGNOSIS — C716 Malignant neoplasm of cerebellum: Secondary | ICD-10-CM

## 2022-05-16 DIAGNOSIS — J329 Chronic sinusitis, unspecified: Secondary | ICD-10-CM | POA: Diagnosis not present

## 2022-05-16 DIAGNOSIS — Z419 Encounter for procedure for purposes other than remedying health state, unspecified: Secondary | ICD-10-CM | POA: Diagnosis not present

## 2022-05-16 MED ORDER — GADOBUTROL 1 MMOL/ML IV SOLN
8.0000 mL | Freq: Once | INTRAVENOUS | Status: AC | PRN
  Administered 2022-05-16: 8 mL via INTRAVENOUS

## 2022-05-19 ENCOUNTER — Inpatient Hospital Stay: Payer: 59 | Attending: Internal Medicine | Admitting: Internal Medicine

## 2022-05-19 ENCOUNTER — Other Ambulatory Visit: Payer: Self-pay

## 2022-05-19 VITALS — BP 121/76 | HR 58 | Temp 98.2°F | Resp 16 | Wt 200.9 lb

## 2022-05-19 DIAGNOSIS — Z923 Personal history of irradiation: Secondary | ICD-10-CM | POA: Insufficient documentation

## 2022-05-19 DIAGNOSIS — C716 Malignant neoplasm of cerebellum: Secondary | ICD-10-CM | POA: Diagnosis not present

## 2022-05-19 NOTE — Progress Notes (Signed)
[facility] at [place], [place] [address] [phone]   Interval Evaluation  Date of Service: 05/19/22 Patient Name: Scott Snyder Patient MRN: [id number] Patient DOB: August 06, 1995 Provider: Ventura Sellers, MD  Identifying Statement:  Scott Snyder is a 26 y.o. male with posterior fossa  medulloblastoma, [facility]     Oncologic History: Oncology History  Medulloblastoma, adult ([place])  03/11/2021 Surgery   Sub-occipital craniotomy, resection by Dr. Zada Finders; path demonstrates medulloblastoma [facility]   03/20/2021 Surgery   Re-do craniotomy for residual tumor; result is gross total resection   04/24/2021 - 06/06/2021 Radiation Therapy   Craniospinal radiation with Dr. Lisbeth Renshaw, no concurrent chemo     Biomarkers:  [facility] PTCH1 mutant .  TP53 Wild type.  MYC pending  TERT "Mutated   Interval History: Scott Snyder presents today for follow up with MRI brain for evaluation.  No clinical changes described today.  Remains active independent, managing fatigue well.  No explicit cognitive complaints.  Continues working as Hotel manager.  H+P (04/04/21) Scott Snyder presented to medical attention last month with several weeks history of new onset headache syndrome.  He described daily AM headaches, increasing severity over time, not responsive to headache medications.  In days prior to ED visit, he began to experience gait difficulty and imbalance.  CNS imaging demonstrated large posterior fossa mass, which was resected in 2 craniotomy sessions with Dr. Zada Finders (most recent 03/20/21).  Since surgery he feels considerably improved, no headaches, no balance issues.  He is functionally intact, living at home, no steroids.  He presents today for path review and treatment planning.  Medications: No current outpatient medications on file prior to visit.   No current facility-administered medications on file prior to visit.     Allergies: No Known Allergies Past Medical History:  Past Medical History:  Diagnosis Date   Headache    Medulloblastoma ([place]) 02/2021   Past Surgical History:  Past Surgical History:  Procedure Laterality Date   APPLICATION OF CRANIAL NAVIGATION N/A 03/11/2021   Procedure: APPLICATION OF CRANIAL NAVIGATION;  Surgeon: Judith Part, MD;  Location: [place];  Service: Neurosurgery;  Laterality: N/A;   APPLICATION OF CRANIAL NAVIGATION N/A 03/20/2021   Procedure: APPLICATION OF CRANIAL NAVIGATION;  Surgeon: Judith Part, MD;  Location: [place];  Service: Neurosurgery;  Laterality: N/A;   SUBOCCIPITAL CRANIECTOMY CERVICAL LAMINECTOMY N/A 03/11/2021   Procedure: SUBOCCIPITAL CRANIECTOMY FOR TUMOR WITH VENTRICULAR DRAIN;  Surgeon: Judith Part, MD;  Location: [place];  Service: Neurosurgery;  Laterality: N/A;   SUBOCCIPITAL CRANIECTOMY CERVICAL LAMINECTOMY N/A 03/20/2021   Procedure: REDO SUBOCCIPITAL CRANIECTOMY FOR BRAIN TUMOR;  Surgeon: Judith Part, MD;  Location: [place];  Service: Neurosurgery;  Laterality: N/A;   Social History:  Social History   Socioeconomic History   Marital status: Single    Spouse name: Not on file   Number of children: Not on file   Years of education: Not on file   Highest education level: Not on file  Occupational History   Not on file  Tobacco Use   Smoking status: Never   Smokeless tobacco: Never  Vaping Use   Vaping Use: Never used  Substance and Sexual Activity   Alcohol use: Not Currently    Comment: socially   Drug use: Yes   Sexual activity: Yes    Birth control/protection: Condom  Other Topics Concern   Not on file  Social History Narrative  Not on file   Social Determinants of Health   Financial Resource Strain: Not on file  Food Insecurity: Not on file  Transportation Needs: Not on file  Physical Activity: Not on file  Stress: Not on file  Social Connections: Not on file  Intimate Partner Violence: Not on  file   Family History:  Family History  Problem Relation Age of Onset   Other Brother    Thyroid cancer Maternal Aunt    Breast cancer Other    Thyroid cancer Other    Thyroid cancer Maternal Great-grandfather     Review of Systems: Constitutional: Doesn't report fevers, chills or abnormal weight loss Eyes: Doesn't report blurriness of vision Ears, nose, mouth, throat, and face: Doesn't report sore throat Respiratory: Doesn't report cough, dyspnea or wheezes Cardiovascular: Doesn't report palpitation, chest discomfort  Gastrointestinal:  Doesn't report nausea, constipation, diarrhea GU: Doesn't report incontinence Skin: Doesn't report skin rashes Neurological: Per HPI Musculoskeletal: Doesn't report joint pain Behavioral/Psych: Doesn't report anxiety  Physical Exam: Vitals:   05/19/22 0909  BP: 121/76  Pulse: (!) 58  Resp: 16  Temp: 98.2 F (36.8 C)  SpO2: 100%    KPS: 90. General: Alert, cooperative, pleasant, in no acute distress Head: Normal EENT: No conjunctival injection or scleral icterus.  Lungs: Resp effort normal Cardiac: Regular rate Abdomen: Non-distended abdomen Skin: No rashes cyanosis or petechiae. Extremities: No clubbing or edema  Neurologic Exam: Mental Status: Awake, alert, attentive to examiner. Oriented to self and environment. Language is fluent with intact comprehension.  Cranial Nerves: Visual acuity is grossly normal. Visual fields are full. Extra-ocular movements intact. No ptosis. Face is symmetric Motor: Tone and bulk are normal. Power is full in both arms and legs. Reflexes are symmetric, no pathologic reflexes present.  Sensory: Intact to light touch Gait: Normal.   Labs: I have reviewed the data as listed    Component Value Date/Time   NA 137 03/11/2021 0846   K 4.0 03/11/2021 0846   CL 104 03/10/2021 0439   CO2 19 (L) 03/10/2021 0439   GLUCOSE 130 (H) 03/10/2021 0439   BUN 23 (H) 03/10/2021 0439   CREATININE 0.97  03/20/2021 1217   CALCIUM 10.0 03/10/2021 0439   PROT 7.3 03/07/2021 0420   ALBUMIN 4.5 03/10/2021 0439   AST 14 (L) 03/07/2021 0420   ALT 14 03/07/2021 0420   ALKPHOS 40 03/07/2021 0420   BILITOT 1.0 03/07/2021 0420   GFRNONAA >60 03/20/2021 1217   GFRAA >60 07/12/2017 0433   Lab Results  Component Value Date   WBC 2.8 (L) 06/19/2021   NEUTROABS 1.4 (L) 06/19/2021   HGB 14.3 06/19/2021   HCT 38.9 (L) 06/19/2021   MCV 86.1 06/19/2021   PLT 191 06/19/2021    Imaging:  [place] Clinician Interpretation: I have personally reviewed the CNS images as listed.  My interpretation, in the context of the patient's clinical presentation, is stable disease  MR BRAIN W WO CONTRAST  Result Date: 05/17/2022 CLINICAL DATA:  Medulloblastoma follow-up EXAM: MRI HEAD WITHOUT AND WITH CONTRAST TECHNIQUE: Multiplanar, multiecho pulse sequences of the brain and surrounding structures were obtained without and with intravenous contrast. CONTRAST:  71m GADAVIST GADOBUTROL 1 MMOL/ML IV SOLN COMPARISON:  01/31/2022 FINDINGS: Brain: No acute infarct, mass effect or extra-axial collection. Left cerebellar resection cavity again with slightly increased thickness of the area of hyperintense T2-weighted signal at the anterior margin. Unchanged chronic blood products. Sequelae of right posterior approach shunt, unchanged. The midline structures are normal. There  is no abnormal contrast enhancement. Vascular: Major flow voids are preserved. Skull and upper cervical spine: Normal calvarium and skull base. Visualized upper cervical spine and soft tissues are normal. Sinuses/Orbits:Chronic right ostiomeatal complex pattern sinusitis. Normal orbits. IMPRESSION: Left cerebellar resection cavity again with minimal increase of hyperintense T2-weighted signal along the anterior margin, but no abnormal contrast enhancement. This again favors evolving post treatment change. Electronically Signed   By: Ulyses Jarred M.D.   On:  05/17/2022 03:18    Assessment/Plan Medulloblastoma, adult ([place])  Scott Snyder is clinically and radiographically stable today, now 12 months removed for CSI radiation for adult medulloblastoma SHH+/TP53-.    Clinical, radiographic, genetic profile and staging remain consistent with low or standard risk medulloblastoma.    As prior, Brain MRI demonstrates modest FLAIR changes which we suspect are 2/2 post-RT leukomalacia.  We recommended continued close imaging surveillance at this time.  We ask that Darrill Vreeland return to clinic in 4 months with MRI brain for evaluation, or sooner as needed.  Spine study can be done as well in the fall.  All questions were answered. The patient knows to call the clinic with any problems, questions or concerns. No barriers to learning were detected.  The total time spent in the encounter was 30 minutes and more than 50% was on counseling and review of test results   Ventura Sellers, MD Medical Director of Neuro-Oncology [facility] at [place] 05/19/22 9:00 AM

## 2022-05-20 ENCOUNTER — Telehealth: Payer: Self-pay | Admitting: Internal Medicine

## 2022-05-20 NOTE — Telephone Encounter (Signed)
Per 12 los called and left message for pt about appointments

## 2022-06-12 ENCOUNTER — Telehealth: Payer: Self-pay

## 2022-06-12 NOTE — Telephone Encounter (Signed)
LVM. Sending mychart msg. AS, CMA

## 2022-06-16 DIAGNOSIS — Z419 Encounter for procedure for purposes other than remedying health state, unspecified: Secondary | ICD-10-CM | POA: Diagnosis not present

## 2022-09-18 ENCOUNTER — Ambulatory Visit (HOSPITAL_COMMUNITY): Admission: RE | Admit: 2022-09-18 | Payer: Medicaid Other | Source: Ambulatory Visit

## 2022-09-21 ENCOUNTER — Ambulatory Visit (HOSPITAL_COMMUNITY)
Admission: RE | Admit: 2022-09-21 | Discharge: 2022-09-21 | Disposition: A | Discharge status: Home / Self Care | Payer: Medicaid Other | Source: Ambulatory Visit | Attending: Internal Medicine | Admitting: Internal Medicine

## 2022-09-21 DIAGNOSIS — C716 Malignant neoplasm of cerebellum: Secondary | ICD-10-CM | POA: Diagnosis present

## 2022-09-21 MED ORDER — GADOBUTROL 1 MMOL/ML IV SOLN
9.0000 mL | Freq: Once | INTRAVENOUS | Status: AC | PRN
  Administered 2022-09-21: 9 mL via INTRAVENOUS

## 2022-09-23 ENCOUNTER — Ambulatory Visit: Payer: 59 | Admitting: Internal Medicine

## 2022-09-23 ENCOUNTER — Inpatient Hospital Stay: Payer: Medicaid Other | Attending: Internal Medicine | Admitting: Internal Medicine

## 2022-09-23 VITALS — BP 121/75 | HR 71 | Temp 99.0°F | Resp 20 | Wt 206.6 lb

## 2022-09-23 DIAGNOSIS — Z803 Family history of malignant neoplasm of breast: Secondary | ICD-10-CM | POA: Insufficient documentation

## 2022-09-23 DIAGNOSIS — Z923 Personal history of irradiation: Secondary | ICD-10-CM | POA: Diagnosis not present

## 2022-09-23 DIAGNOSIS — C716 Malignant neoplasm of cerebellum: Secondary | ICD-10-CM

## 2022-09-23 DIAGNOSIS — Z808 Family history of malignant neoplasm of other organs or systems: Secondary | ICD-10-CM | POA: Insufficient documentation

## 2022-09-23 NOTE — Progress Notes (Signed)
[facility] at [facility] [address]  [place], [place] [address] [phone]   Interval Evaluation  Date of Service: 09/23/22 Patient Name: Scott Snyder Patient MRN: [id number] Patient DOB: 1996/02/25 Provider: Henreitta Leber, MD  Identifying Statement:  Scott Snyder is a 27 y.o. male with posterior fossa  medulloblastoma, [facility]     Oncologic History: Oncology History  Medulloblastoma, adult  03/11/2021 Surgery   Sub-occipital craniotomy, resection by Dr. Maurice Small; path demonstrates medulloblastoma [facility]   03/20/2021 Surgery   Re-do craniotomy for residual tumor; result is gross total resection   04/24/2021 - 06/06/2021 Radiation Therapy   Craniospinal radiation with Dr. Mitzi Hansen, no concurrent chemo     Biomarkers:  [facility] PTCH1 mutant .  TP53 Wild type.  MYC pending  TERT "Mutated   Interval History: Scott Snyder presents today for follow up with MRI brain for evaluation.  Denies new or progressive deficits.  Remains active independent, managing fatigue well.  No explicit cognitive complaints.  Continues working as Geophysicist/field seismologist.  H+P (04/04/21) Scott Snyder presented to medical attention last month with several weeks history of new onset headache syndrome.  He described daily AM headaches, increasing severity over time, not responsive to headache medications.  In days prior to ED visit, he began to experience gait difficulty and imbalance.  CNS imaging demonstrated large posterior fossa mass, which was resected in 2 craniotomy sessions with Dr. Maurice Small (most recent 03/20/21).  Since surgery he feels considerably improved, no headaches, no balance issues.  He is functionally intact, living at home, no steroids.  He presents today for path review and treatment planning.  Medications: No current outpatient medications on file prior to visit.   No current facility-administered medications on file prior to visit.    Allergies: No Known  Allergies Past Medical History:  Past Medical History:  Diagnosis Date   Headache    Medulloblastoma (HCC) 02/2021   Past Surgical History:  Past Surgical History:  Procedure Laterality Date   APPLICATION OF CRANIAL NAVIGATION N/A 03/11/2021   Procedure: APPLICATION OF CRANIAL NAVIGATION;  Surgeon: Jadene Pierini, MD;  Location: [facility] OR;  Service: Neurosurgery;  Laterality: N/A;   APPLICATION OF CRANIAL NAVIGATION N/A 03/20/2021   Procedure: APPLICATION OF CRANIAL NAVIGATION;  Surgeon: Jadene Pierini, MD;  Location: [facility] OR;  Service: Neurosurgery;  Laterality: N/A;   SUBOCCIPITAL CRANIECTOMY CERVICAL LAMINECTOMY N/A 03/11/2021   Procedure: SUBOCCIPITAL CRANIECTOMY FOR TUMOR WITH VENTRICULAR DRAIN;  Surgeon: Jadene Pierini, MD;  Location: [facility] OR;  Service: Neurosurgery;  Laterality: N/A;   SUBOCCIPITAL CRANIECTOMY CERVICAL LAMINECTOMY N/A 03/20/2021   Procedure: REDO SUBOCCIPITAL CRANIECTOMY FOR BRAIN TUMOR;  Surgeon: Jadene Pierini, MD;  Location: [facility] OR;  Service: Neurosurgery;  Laterality: N/A;   Social History:  Social History   Socioeconomic History   Marital status: Single    Spouse name: Not on file   Number of children: Not on file   Years of education: Not on file   Highest education level: Not on file  Occupational History   Not on file  Tobacco Use   Smoking status: Never   Smokeless tobacco: Never  Vaping Use   Vaping Use: Never used  Substance and Sexual Activity   Alcohol use: Not Currently    Comment: socially   Drug use: Yes   Sexual activity: Yes    Birth control/protection: Condom  Other Topics Concern   Not on file  Social History Narrative   Not on file  Social Determinants of Health   Financial Resource Strain: Not on file  Food Insecurity: Not on file  Transportation Needs: Not on file  Physical Activity: Not on file  Stress: Not on file  Social Connections: Not on file  Intimate Partner Violence: Not on file   Family History:   Family History  Problem Relation Age of Onset   Other Brother    Thyroid cancer Maternal Aunt    Breast cancer Other    Thyroid cancer Other    Thyroid cancer Maternal Great-grandfather     Review of Systems: Constitutional: Doesn't report fevers, chills or abnormal weight loss Eyes: Doesn't report blurriness of vision Ears, nose, mouth, throat, and face: Doesn't report sore throat Respiratory: Doesn't report cough, dyspnea or wheezes Cardiovascular: Doesn't report palpitation, chest discomfort  Gastrointestinal:  Doesn't report nausea, constipation, diarrhea GU: Doesn't report incontinence Skin: Doesn't report skin rashes Neurological: Per HPI Musculoskeletal: Doesn't report joint pain Behavioral/Psych: Doesn't report anxiety  Physical Exam: Vitals:   09/23/22 0905  BP: 121/75  Pulse: 71  Resp: 20  Temp: 99 F (37.2 C)  SpO2: 100%     KPS: 90. General: Alert, cooperative, pleasant, in no acute distress Head: Normal EENT: No conjunctival injection or scleral icterus.  Lungs: Resp effort normal Cardiac: Regular rate Abdomen: Non-distended abdomen Skin: No rashes cyanosis or petechiae. Extremities: No clubbing or edema  Neurologic Exam: Mental Status: Awake, alert, attentive to examiner. Oriented to self and environment. Language is fluent with intact comprehension.  Cranial Nerves: Visual acuity is grossly normal. Visual fields are full. Extra-ocular movements intact. No ptosis. Face is symmetric Motor: Tone and bulk are normal. Power is full in both arms and legs. Reflexes are symmetric, no pathologic reflexes present.  Sensory: Intact to light touch Gait: Normal.   Labs: I have reviewed the data as listed    Component Value Date/Time   NA 137 03/11/2021 0846   K 4.0 03/11/2021 0846   CL 104 03/10/2021 0439   CO2 19 (L) 03/10/2021 0439   GLUCOSE 130 (H) 03/10/2021 0439   BUN 23 (H) 03/10/2021 0439   CREATININE 0.97 03/20/2021 1217   CALCIUM 10.0  03/10/2021 0439   PROT 7.3 03/07/2021 0420   ALBUMIN 4.5 03/10/2021 0439   AST 14 (L) 03/07/2021 0420   ALT 14 03/07/2021 0420   ALKPHOS 40 03/07/2021 0420   BILITOT 1.0 03/07/2021 0420   GFRNONAA >60 03/20/2021 1217   GFRAA >60 07/12/2017 0433   Lab Results  Component Value Date   WBC 2.8 (L) 06/19/2021   NEUTROABS 1.4 (L) 06/19/2021   HGB 14.3 06/19/2021   HCT 38.9 (L) 06/19/2021   MCV 86.1 06/19/2021   PLT 191 06/19/2021    Imaging:  [facility] Clinician Interpretation: I have personally reviewed the CNS images as listed.  My interpretation, in the context of the patient's clinical presentation, is stable disease  No results found.  Assessment/Plan Medulloblastoma, adult  Scott Snyder is clinically and radiographically stable today.  No new or progressive changes.  Clinical, radiographic, genetic profile and staging remain consistent with low or standard risk medulloblastoma.    We ask that Scott Snyder return to clinic in 4 months with MRI brain for evaluation, or sooner as needed.  Spine study can be done as well in the fall.  All questions were answered. The patient knows to call the clinic with any problems, questions or concerns. No barriers to learning were detected.  The total time spent in the encounter  was 30 minutes and more than 50% was on counseling and review of test results   Henreitta Leber, MD Medical Director of Neuro-Oncology [facility] 09/23/22 8:58 AM

## 2023-01-14 ENCOUNTER — Telehealth: Payer: Self-pay | Admitting: Internal Medicine

## 2023-01-14 NOTE — Telephone Encounter (Signed)
Called patient regarding August appointments, left a voicemail.

## 2023-01-22 ENCOUNTER — Telehealth: Payer: Self-pay | Admitting: Internal Medicine

## 2023-01-22 ENCOUNTER — Ambulatory Visit (HOSPITAL_COMMUNITY): Payer: Medicaid Other

## 2023-01-22 NOTE — Telephone Encounter (Signed)
Called patient regarding August appointments, patient is notified.  

## 2023-01-27 ENCOUNTER — Inpatient Hospital Stay: Payer: Medicaid Other | Admitting: Internal Medicine

## 2023-01-28 ENCOUNTER — Encounter (HOSPITAL_COMMUNITY): Payer: Self-pay

## 2023-01-28 ENCOUNTER — Ambulatory Visit (HOSPITAL_COMMUNITY): Payer: Medicaid Other

## 2023-02-03 ENCOUNTER — Ambulatory Visit (HOSPITAL_COMMUNITY)
Admission: RE | Admit: 2023-02-03 | Discharge: 2023-02-03 | Disposition: A | Discharge status: Home / Self Care | Payer: Medicaid Other | Source: Ambulatory Visit | Attending: Internal Medicine | Admitting: Internal Medicine

## 2023-02-03 ENCOUNTER — Ambulatory Visit: Payer: Medicaid Other | Admitting: Internal Medicine

## 2023-02-03 DIAGNOSIS — C716 Malignant neoplasm of cerebellum: Secondary | ICD-10-CM | POA: Diagnosis present

## 2023-02-03 MED ORDER — GADOBUTROL 1 MMOL/ML IV SOLN
9.0000 mL | Freq: Once | INTRAVENOUS | Status: AC | PRN
  Administered 2023-02-03: 9 mL via INTRAVENOUS

## 2023-02-05 ENCOUNTER — Inpatient Hospital Stay: Payer: Medicaid Other | Attending: Internal Medicine | Admitting: Internal Medicine

## 2023-02-05 VITALS — BP 124/79 | HR 98 | Temp 97.7°F | Resp 20 | Wt 195.8 lb

## 2023-02-05 DIAGNOSIS — C716 Malignant neoplasm of cerebellum: Secondary | ICD-10-CM | POA: Insufficient documentation

## 2023-02-05 NOTE — Progress Notes (Signed)
[facility] at [facility] [address]  [place], [place] [address] [phone]   Interval Evaluation  Date of Service: 02/05/23 Patient Name: Scott Snyder Patient MRN: [id number] Patient DOB: 07/17/1995 Provider: Henreitta Leber, MD  Identifying Statement:  Scott Snyder is a 27 y.o. male with posterior fossa  medulloblastoma, [facility]     Oncologic History: Oncology History  Medulloblastoma, adult (HCC)  03/11/2021 Surgery   Sub-occipital craniotomy, resection by Dr. Maurice Small; path demonstrates medulloblastoma [facility]   03/20/2021 Surgery   Re-do craniotomy for residual tumor; result is gross total resection   04/24/2021 - 06/06/2021 Radiation Therapy   Craniospinal radiation with Dr. Mitzi Hansen, no concurrent chemo     Biomarkers:  [facility] PTCH1 mutant .  TP53 Wild type.  MYC pending  TERT "Mutated   Interval History: Scott Snyder presents today for follow up with MRI brain for evaluation.  No clinical changes reported.  Remains active independent, managing fatigue well.  No explicit cognitive complaints.  Continues working as Scott Snyder.  H+P (04/04/21) Scott Snyder presented to medical attention last month with several weeks history of new onset headache syndrome.  He described daily AM headaches, increasing severity over time, not responsive to headache medications.  In days prior to ED visit, he began to experience gait difficulty and imbalance.  CNS imaging demonstrated large posterior fossa mass, which was resected in 2 craniotomy sessions with Dr. Maurice Small (most recent 03/20/21).  Since surgery he feels considerably improved, no headaches, no balance issues.  He is functionally intact, living at home, no steroids.  He presents today for path review and treatment planning.  Medications: No current outpatient medications on file prior to visit.   No current facility-administered medications on file prior to visit.    Allergies: No Known  Allergies Past Medical History:  Past Medical History:  Diagnosis Date   Headache    Medulloblastoma (HCC) 02/2021   Past Surgical History:  Past Surgical History:  Procedure Laterality Date   APPLICATION OF CRANIAL NAVIGATION N/A 03/11/2021   Procedure: APPLICATION OF CRANIAL NAVIGATION;  Surgeon: Jadene Pierini, MD;  Location: [facility];  Service: Neurosurgery;  Laterality: N/A;   APPLICATION OF CRANIAL NAVIGATION N/A 03/20/2021   Procedure: APPLICATION OF CRANIAL NAVIGATION;  Surgeon: Jadene Pierini, MD;  Location: [facility] OR;  Service: Neurosurgery;  Laterality: N/A;   SUBOCCIPITAL CRANIECTOMY CERVICAL LAMINECTOMY N/A 03/11/2021   Procedure: SUBOCCIPITAL CRANIECTOMY FOR TUMOR WITH VENTRICULAR DRAIN;  Surgeon: Jadene Pierini, MD;  Location: [facility] OR;  Service: Neurosurgery;  Laterality: N/A;   SUBOCCIPITAL CRANIECTOMY CERVICAL LAMINECTOMY N/A 03/20/2021   Procedure: REDO SUBOCCIPITAL CRANIECTOMY FOR BRAIN TUMOR;  Surgeon: Jadene Pierini, MD;  Location: [facility];  Service: Neurosurgery;  Laterality: N/A;   Social History:  Social History   Socioeconomic History   Marital status: Single    Spouse name: Not on file   Number of children: Not on file   Years of education: Not on file   Highest education level: Not on file  Occupational History   Not on file  Tobacco Use   Smoking status: Never   Smokeless tobacco: Never  Vaping Use   Vaping status: Never Used  Substance and Sexual Activity   Alcohol use: Not Currently    Comment: socially   Drug use: Yes   Sexual activity: Yes    Birth control/protection: Condom  Other Topics Concern   Not on file  Social History Narrative   Not on file  Social Determinants of Health   Financial Resource Strain: Not on file  Food Insecurity: No Food Insecurity (02/26/2021)   Received from [facility], [facility]   Hunger Vital Sign    Worried About Running Out of Food in the Last Year: Never true    Ran Out of Food in the Last  Year: Never true  Transportation Needs: Not on file  Physical Activity: Not on file  Stress: Not on file  Social Connections: Unknown (10/29/2021)   Received from [facility], [facility]   Social Network    Social Network: Not on file  Intimate Partner Violence: Unknown (09/20/2021)   [facility], [facility]   HITS    Physically Hurt: Not on file    Insult or Talk Down To: Not on file    Threaten Physical Harm: Not on file    Scream or Curse: Not on file   Family History:  Family History  Problem Relation Age of Onset   Other Brother    Thyroid cancer Maternal Aunt    Breast cancer Other    Thyroid cancer Other    Thyroid cancer Maternal Great-grandfather     Review of Systems: Constitutional: Doesn't report fevers, chills or abnormal weight loss Eyes: Doesn't report blurriness of vision Ears, nose, mouth, throat, and face: Doesn't report sore throat Respiratory: Doesn't report cough, dyspnea or wheezes Cardiovascular: Doesn't report palpitation, chest discomfort  Gastrointestinal:  Doesn't report nausea, constipation, diarrhea GU: Doesn't report incontinence Skin: Doesn't report skin rashes Neurological: Per HPI Musculoskeletal: Doesn't report joint pain Behavioral/Psych: Doesn't report anxiety  Physical Exam: Vitals:   02/05/23 1050  BP: 124/79  Pulse: 98  Resp: 20  Temp: 97.7 F (36.5 C)  SpO2: 100%   KPS: 90. General: Alert, cooperative, pleasant, in no acute distress Head: Normal EENT: No conjunctival injection or scleral icterus.  Lungs: Resp effort normal Cardiac: Regular rate Abdomen: Non-distended abdomen Skin: No rashes cyanosis or petechiae. Extremities: No clubbing or edema  Neurologic Exam: Mental Status: Awake, alert, attentive to examiner. Oriented to self and environment. Language is fluent with intact comprehension.  Cranial Nerves: Visual acuity is grossly normal. Visual fields are full. Extra-ocular movements intact.  No ptosis. Face is symmetric Motor: Tone and bulk are normal. Power is full in both arms and legs. Reflexes are symmetric, no pathologic reflexes present.  Sensory: Intact to light touch Gait: Normal.   Labs: I have reviewed the data as listed    Component Value Date/Time   NA 137 03/11/2021 0846   K 4.0 03/11/2021 0846   CL 104 03/10/2021 0439   CO2 19 (L) 03/10/2021 0439   GLUCOSE 130 (H) 03/10/2021 0439   BUN 23 (H) 03/10/2021 0439   CREATININE 0.97 03/20/2021 1217   CALCIUM 10.0 03/10/2021 0439   PROT 7.3 03/07/2021 0420   ALBUMIN 4.5 03/10/2021 0439   AST 14 (L) 03/07/2021 0420   ALT 14 03/07/2021 0420   ALKPHOS 40 03/07/2021 0420   BILITOT 1.0 03/07/2021 0420   GFRNONAA >60 03/20/2021 1217   GFRAA >60 07/12/2017 0433   Lab Results  Component Value Date   WBC 2.8 (L) 06/19/2021   NEUTROABS 1.4 (L) 06/19/2021   HGB 14.3 06/19/2021   HCT 38.9 (L) 06/19/2021   MCV 86.1 06/19/2021   PLT 191 06/19/2021    Imaging:  [facility] Clinician Interpretation: I have personally reviewed the CNS images as listed.  My interpretation, in the context of the patient's clinical presentation, is  stable disease  No results found.  Assessment/Plan Medulloblastoma, adult (HCC)  Scott Snyder is clinically and radiographically stable today.  No new or progressive changes.  MRI demonstrates focus of T2 signal adjacent to resection cavity, unchanged from prior study.  Clinical, radiographic, genetic profile and staging remain consistent with low or standard risk medulloblastoma.    We ask that Scott Snyder return to clinic in 4 months with MRI brain for evaluation, or sooner as needed.  Spine study can be done as well in the fall.  All questions were answered. The patient knows to call the clinic with any problems, questions or concerns. No barriers to learning were detected.  The total time spent in the encounter was 40 minutes and more than 50% was on counseling and review of test  results   Henreitta Leber, MD Medical Director of Neuro-Oncology [facility] 02/05/23 11:01 AM

## 2023-05-25 ENCOUNTER — Ambulatory Visit (HOSPITAL_COMMUNITY): Admission: RE | Admit: 2023-05-25 | Payer: Medicaid Other | Source: Ambulatory Visit

## 2023-05-25 ENCOUNTER — Encounter (HOSPITAL_COMMUNITY): Payer: Self-pay

## 2023-05-26 ENCOUNTER — Telehealth: Payer: Self-pay | Admitting: *Deleted

## 2023-05-26 NOTE — Telephone Encounter (Signed)
PC to patient, informed him he missed his MRI appointment yesterday & his upcoming appointment with Dr Barbaraann Cao on 05/28/23 will need to be changed.  Patient states he will contact Central Scheduling to reschedule MRI, phone number given.  Patient states he will be out of town for the next couple of weeks & probably can't come in until January.  Informed patient we will reschedule when he is available after his MRI, he verbalizes understanding.

## 2023-05-28 ENCOUNTER — Inpatient Hospital Stay: Payer: Medicaid Other | Admitting: Internal Medicine

## 2023-06-25 ENCOUNTER — Ambulatory Visit (HOSPITAL_COMMUNITY)
Admission: RE | Admit: 2023-06-25 | Discharge: 2023-06-25 | Disposition: A | Discharge status: Home / Self Care | Payer: Medicaid Other | Source: Ambulatory Visit | Attending: Internal Medicine | Admitting: Internal Medicine

## 2023-06-25 DIAGNOSIS — C716 Malignant neoplasm of cerebellum: Secondary | ICD-10-CM | POA: Diagnosis present

## 2023-06-25 MED ORDER — GADOBUTROL 1 MMOL/ML IV SOLN
8.0000 mL | Freq: Once | INTRAVENOUS | Status: AC | PRN
  Administered 2023-06-25: 8 mL via INTRAVENOUS

## 2023-07-11 ENCOUNTER — Telehealth: Payer: Self-pay | Admitting: Internal Medicine

## 2023-07-11 NOTE — Telephone Encounter (Signed)
Marland Kitchen

## 2023-07-13 ENCOUNTER — Telehealth: Payer: Self-pay | Admitting: Internal Medicine

## 2023-07-13 NOTE — Telephone Encounter (Signed)
Patient cancelled apointment. Patient stated he will check with boss for time off and call back to re-schedule.

## 2023-07-14 ENCOUNTER — Ambulatory Visit: Payer: Medicaid Other | Admitting: Internal Medicine

## 2023-07-15 ENCOUNTER — Telehealth: Payer: Self-pay | Admitting: *Deleted

## 2023-07-15 NOTE — Telephone Encounter (Signed)
Patient had brain MRI on 06/25/23, needs F/U appointment with Dr Barbaraann Cao, scheduling message sent.

## 2023-07-21 ENCOUNTER — Encounter: Payer: Self-pay | Admitting: *Deleted

## 2023-07-21 ENCOUNTER — Telehealth: Payer: Self-pay | Admitting: Internal Medicine

## 2023-07-21 NOTE — Telephone Encounter (Signed)
 Marland Kitchen

## 2023-07-22 ENCOUNTER — Telehealth: Payer: Self-pay | Admitting: Internal Medicine

## 2023-07-22 NOTE — Telephone Encounter (Signed)
 Patient Scheduled appts. Patient is aware of all appt details.

## 2023-08-04 ENCOUNTER — Inpatient Hospital Stay: Payer: Medicaid Other | Attending: Internal Medicine | Admitting: Internal Medicine

## 2023-08-04 ENCOUNTER — Telehealth: Payer: Self-pay | Admitting: Internal Medicine

## 2023-08-04 VITALS — BP 132/78 | HR 64 | Temp 98.3°F | Resp 13 | Wt 206.6 lb

## 2023-08-04 DIAGNOSIS — C716 Malignant neoplasm of cerebellum: Secondary | ICD-10-CM

## 2023-08-04 NOTE — Progress Notes (Signed)
[facility] at [facility] [address]  [place], [place] [address] [phone]   Interval Evaluation  Date of Service: 08/04/23 Patient Name: Scott Snyder Patient MRN: [id number] Patient DOB: 25-Aug-1995 Provider: Henreitta Leber, MD  Identifying Statement:  Scott Snyder is a 28 y.o. male with posterior fossa  medulloblastoma, [facility]     Oncologic History: Oncology History  Medulloblastoma, adult (HCC)  03/11/2021 Surgery   Sub-occipital craniotomy, resection by Dr. Maurice Small; path demonstrates medulloblastoma [facility]   03/20/2021 Surgery   Re-do craniotomy for residual tumor; result is gross total resection   04/24/2021 - 06/06/2021 Radiation Therapy   Craniospinal radiation with Dr. Mitzi Hansen, no concurrent chemo     Biomarkers:  [facility] PTCH1 mutant .  TP53 Wild type.  MYC pending  TERT "Mutated   Interval History: [name] presents today for follow up with MRI brain for evaluation.  No new or progressive changes reported.  Remains active independent, managing fatigue well.  No explicit cognitive complaints.  Working full time.  H+P (04/04/21) [name] presented to medical attention last month with several weeks history of new onset headache syndrome.  He described daily AM headaches, increasing severity over time, not responsive to headache medications.  In days prior to ED visit, he began to experience gait difficulty and imbalance.  CNS imaging demonstrated large posterior fossa mass, which was resected in 2 craniotomy sessions with Dr. Maurice Small (most recent 03/20/21).  Since surgery he feels considerably improved, no headaches, no balance issues.  He is functionally intact, living at home, no steroids.  He presents today for path review and treatment planning.  Medications: No current outpatient medications on file prior to visit.   No current facility-administered medications on file prior to visit.    Allergies: No Known Allergies Past Medical  History:  Past Medical History:  Diagnosis Date   Headache    Medulloblastoma (HCC) 02/2021   Past Surgical History:  Past Surgical History:  Procedure Laterality Date   APPLICATION OF CRANIAL NAVIGATION N/A 03/11/2021   Procedure: APPLICATION OF CRANIAL NAVIGATION;  Surgeon: Jadene Pierini, MD;  [facility]: [facility] OR;  Service: Neurosurgery;  Laterality: N/A;   APPLICATION OF CRANIAL NAVIGATION N/A 03/20/2021   Procedure: APPLICATION OF CRANIAL NAVIGATION;  Surgeon: Jadene Pierini, MD;  Location: [facility] OR;  Service: Neurosurgery;  Laterality: N/A;   SUBOCCIPITAL CRANIECTOMY CERVICAL LAMINECTOMY N/A 03/11/2021   Procedure: SUBOCCIPITAL CRANIECTOMY FOR TUMOR WITH VENTRICULAR DRAIN;  Surgeon: Jadene Pierini, MD;  Location: [facility] OR;  Service: Neurosurgery;  Laterality: N/A;   SUBOCCIPITAL CRANIECTOMY CERVICAL LAMINECTOMY N/A 03/20/2021   Procedure: REDO SUBOCCIPITAL CRANIECTOMY FOR BRAIN TUMOR;  Surgeon: Jadene Pierini, MD;  Location: [facility] OR;  Service: Neurosurgery;  Laterality: N/A;   Social History:  Social History   Socioeconomic History   Marital status: Single    Spouse name: Not on file   Number of children: Not on file   Years of education: Not on file   Highest education level: Not on file  Occupational History   Not on file  Tobacco Use   Smoking status: Never   Smokeless tobacco: Never  Vaping Use   Vaping status: Never Used  Substance and Sexual Activity   Alcohol use: Not Currently    Comment: socially   Drug use: Yes   Sexual activity: Yes    Birth control/protection: Condom  Other Topics Concern   Not on file  Social History Narrative   Not on file  Social Drivers of Corporate investment banker Strain: Not on file  Food Insecurity: No Food Insecurity (02/26/2021)   Received from [facility]   Hunger Vital Sign    Worried About Running Out of Food in the Last Year: Never true    Scott Out of Food in the Last Year: Never true   Transportation Needs: Not on file  Physical Activity: Not on file  Stress: Not on file  Social Connections: Unknown (10/29/2021)   Received from [facility], [facility]   Social Network    Social Network: Not on file  Intimate Partner Violence: Unknown (09/20/2021)   Received from [employer], [facility]   HITS    Physically Hurt: Not on file    Insult or Talk Down To: Not on file    Threaten Physical Harm: Not on file    Scream or Curse: Not on file   Family History:  Family History  Problem Relation Age of Onset   Other Brother    Thyroid cancer Maternal Aunt    Breast cancer Other    Thyroid cancer Other    Thyroid cancer Maternal Great-grandfather     Review of Systems: Constitutional: Doesn't report fevers, chills or abnormal weight loss Eyes: Doesn't report blurriness of vision Ears, nose, mouth, throat, and face: Doesn't report sore throat Respiratory: Doesn't report cough, dyspnea or wheezes Cardiovascular: Doesn't report palpitation, chest discomfort  Gastrointestinal:  Doesn't report nausea, constipation, diarrhea GU: Doesn't report incontinence Skin: Doesn't report skin rashes Neurological: Per HPI Musculoskeletal: Doesn't report joint pain Behavioral/Psych: Doesn't report anxiety  Physical Exam: Vitals:   08/04/23 0901  BP: 132/78  Pulse: 64  Resp: 13  Temp: 98.3 F (36.8 C)  SpO2: 100%    KPS: 90. General: Alert, cooperative, pleasant, in no acute distress Head: Normal EENT: No conjunctival injection or scleral icterus.  Lungs: Resp effort normal Cardiac: Regular rate Abdomen: Non-distended abdomen Skin: No rashes cyanosis or petechiae. Extremities: No clubbing or edema  Neurologic Exam: Mental Status: Awake, alert, attentive to examiner. Oriented to self and environment. Language is fluent with intact comprehension.  Cranial Nerves: Visual acuity is grossly normal. Visual fields are full. Extra-ocular movements intact. No ptosis.  Face is symmetric Motor: Tone and bulk are normal. Power is full in both arms and legs. Reflexes are symmetric, no pathologic reflexes present.  Sensory: Intact to light touch Gait: Normal.   Labs: I have reviewed the data as listed    Component Value Date/Time   NA 137 03/11/2021 0846   K 4.0 03/11/2021 0846   CL 104 03/10/2021 0439   CO2 19 (L) 03/10/2021 0439   GLUCOSE 130 (H) 03/10/2021 0439   BUN 23 (H) 03/10/2021 0439   CREATININE 0.97 03/20/2021 1217   CALCIUM 10.0 03/10/2021 0439   PROT 7.3 03/07/2021 0420   ALBUMIN 4.5 03/10/2021 0439   AST 14 (L) 03/07/2021 0420   ALT 14 03/07/2021 0420   ALKPHOS 40 03/07/2021 0420   BILITOT 1.0 03/07/2021 0420   GFRNONAA >60 03/20/2021 1217   GFRAA >60 07/12/2017 0433   Lab Results  Component Value Date   WBC 2.8 (L) 06/19/2021   NEUTROABS 1.4 (L) 06/19/2021   HGB 14.3 06/19/2021   HCT 38.9 (L) 06/19/2021   MCV 86.1 06/19/2021   PLT 191 06/19/2021    Imaging:  [facility] Clinician Interpretation: I have personally reviewed the CNS images as listed.  My interpretation, in the context of the patient's clinical presentation,  is stable disease  CLINICAL DATA:  Follow-up examination for brain/CNS neoplasm, assess treatment response. History of medulloblastoma status post resection.   EXAM: MRI HEAD WITHOUT AND WITH CONTRAST   TECHNIQUE: Multiplanar, multiecho pulse sequences of the brain and surrounding structures were obtained without and with intravenous contrast.   CONTRAST:  8mL GADAVIST GADOBUTROL 1 MMOL/ML IV SOLN   COMPARISON:  MRI from 02/03/2023.   FINDINGS: Brain: Postoperative changes from prior left suboccipital craniotomy for left cerebellar mass resection. Resection cavity is stable in appearance as compared to previous. No enhancement to suggest residual or recurrent disease. Regional T2/FLAIR signal i intensities not significantly changed or progressed.   Stable postoperative changes from prior right  posterior approach ventriculostomy.   Cerebral volume within normal limits. No evidence for acute or subacute infarct. No other mass lesion, midline shift or mass effect. No hydrocephalus or extra-axial fluid collection. Pituitary gland suprasellar region within normal limits. No other abnormal enhancement.   Vascular: Major intracranial vascular flow voids are maintained.   Skull and upper cervical spine: Craniocervical junction with normal limits. Bone marrow signal intensity normal. No acute scalp soft tissue abnormality.   Sinuses/Orbits: Globes orbital soft tissues within normal limits. Chronic right sided paranasal sinusitis, progressed from prior. Mastoid air cells remain largely clear.   Other: None.   IMPRESSION: 1. Stable postoperative changes from prior left cerebellar mass resection. No evidence for residual or recurrent disease. 2. No other acute intracranial abnormality. 3. Chronic right-sided paranasal sinusitis, progressed from prior.     Electronically Signed   By: Rise Mu M.D.   On: 06/25/2023 20:25   Assessment/Plan Medulloblastoma, adult (HCC)  [name] is clinically and radiographically stable today.  No new or progressive changes.  No progression of leukomalacia or T2 signal abnormality from treatment effect.  Clinical, radiographic, genetic profile and staging remain consistent with low or standard risk medulloblastoma.    We ask that Jhovanny Guinta return to clinic in 6 months with MRI brain for evaluation, or sooner as needed.  Spine study can be done as well in the fall.  All questions were answered. The patient knows to call the clinic with any problems, questions or concerns. No barriers to learning were detected.  The total time spent in the encounter was 30 minutes and more than 50% was on counseling and review of test results   Henreitta Leber, MD Medical Director of Neuro-Oncology [facility] at [place]  [facility] 08/04/23 9:00 AM

## 2023-08-04 NOTE — Telephone Encounter (Signed)
 Marland Kitchen

## 2023-08-12 ENCOUNTER — Telehealth: Payer: Self-pay | Admitting: Internal Medicine

## 2023-08-12 NOTE — Telephone Encounter (Signed)
 Scott Snyder

## 2024-02-01 ENCOUNTER — Ambulatory Visit: Payer: Medicaid Other | Admitting: Internal Medicine

## 2024-02-03 ENCOUNTER — Telehealth: Payer: Self-pay | Admitting: *Deleted

## 2024-02-03 NOTE — Telephone Encounter (Signed)
 PC to patient, informed him we do not have insurance authorization for his MRI at this time.  It is scheduled tomorrow & will be canceled.  Once we have authorization we will reschedule his MRI & his appointment with Dr Buckley.  Patient verbalizes understanding.

## 2024-02-04 ENCOUNTER — Ambulatory Visit (HOSPITAL_COMMUNITY): Admission: RE | Admit: 2024-02-04 | Source: Ambulatory Visit

## 2024-02-08 ENCOUNTER — Inpatient Hospital Stay: Payer: Medicaid Other | Admitting: Internal Medicine

## 2024-02-09 ENCOUNTER — Ambulatory Visit (HOSPITAL_COMMUNITY)
Admission: RE | Admit: 2024-02-09 | Discharge: 2024-02-09 | Disposition: A | Discharge status: Home / Self Care | Source: Ambulatory Visit | Attending: Internal Medicine | Admitting: Internal Medicine

## 2024-02-09 DIAGNOSIS — C716 Malignant neoplasm of cerebellum: Secondary | ICD-10-CM | POA: Diagnosis present

## 2024-02-09 MED ORDER — GADOBUTROL 1 MMOL/ML IV SOLN
9.0000 mL | Freq: Once | INTRAVENOUS | Status: AC | PRN
  Administered 2024-02-09: 9 mL via INTRAVENOUS

## 2024-02-24 ENCOUNTER — Telehealth: Payer: Self-pay | Admitting: Internal Medicine

## 2024-02-24 NOTE — Telephone Encounter (Signed)
 Scheduled appointment per staff message. Called and left a VM with the appointment details for the patient.

## 2024-02-29 ENCOUNTER — Telehealth: Payer: Self-pay | Admitting: Internal Medicine

## 2024-02-29 NOTE — Telephone Encounter (Signed)
 Called to touch base with the patient about his appointments for 9/16. Unable to make contact with the patient; therefore, another VM was left with the appointment details for the patient.

## 2024-03-01 ENCOUNTER — Telehealth: Payer: Self-pay | Admitting: Internal Medicine

## 2024-03-01 ENCOUNTER — Inpatient Hospital Stay: Attending: Internal Medicine | Admitting: Internal Medicine

## 2024-03-01 VITALS — BP 112/86 | HR 63 | Temp 98.2°F | Resp 18 | Ht 75.0 in | Wt 203.0 lb

## 2024-03-01 DIAGNOSIS — Z85841 Personal history of malignant neoplasm of brain: Secondary | ICD-10-CM | POA: Diagnosis present

## 2024-03-01 DIAGNOSIS — C716 Malignant neoplasm of cerebellum: Secondary | ICD-10-CM | POA: Diagnosis not present

## 2024-03-01 DIAGNOSIS — J329 Chronic sinusitis, unspecified: Secondary | ICD-10-CM | POA: Insufficient documentation

## 2024-03-01 NOTE — Progress Notes (Signed)
 [facility] at [facility] [address]  [place], [place] [address] [phone]   Interval Evaluation  Date of Service: 03/01/24 Patient Name: Scott Snyder Patient MRN: [id number] Patient DOB: May 18, 1996 Provider: Arthea MARLA Manns, MD  Identifying Statement:  Scott Snyder is a 28 y.o. male with posterior fossa medulloblastoma, [facility]    Oncologic History: Oncology History  Medulloblastoma, adult (HCC)  03/11/2021 Surgery   Sub-occipital craniotomy, resection by Dr. Cheryle; path demonstrates medulloblastoma [facility]   03/20/2021 Surgery   Re-do craniotomy for residual tumor; result is gross total resection   04/24/2021 - 06/06/2021 Radiation Therapy   Craniospinal radiation with Dr. Dewey, no concurrent chemo     Biomarkers:  [facility] PTCH1 mutant.  TP53 Wild type.  MYC pending  TERT Mutated   Interval History: Scott Snyder presents today for follow up with MRI brain for evaluation.  Doesn't describe any new or progressive deficits today.  Remains active independent, managing fatigue well.  No explicit cognitive complaints.  Working full time.  H+P (04/04/21) Scott Snyder presented to medical attention last month with several weeks history of new onset headache syndrome.  He described daily AM headaches, increasing severity over time, not responsive to headache medications.  In days prior to ED visit, he began to experience gait difficulty and imbalance.  CNS imaging demonstrated large posterior fossa mass, which was resected in 2 craniotomy sessions with Dr. Cheryle (most recent 03/20/21).  Since surgery he feels considerably improved, no headaches, no balance issues.  He is functionally intact, living at home, no steroids.  He presents today for path review and treatment planning.  Medications: No current outpatient medications on file prior to visit.   No current facility-administered medications on file prior to visit.    Allergies: No Known  Allergies Past Medical History:  Past Medical History:  Diagnosis Date   Headache    Medulloblastoma (HCC) 02/2021   Past Surgical History:  Past Surgical History:  Procedure Laterality Date   APPLICATION OF CRANIAL NAVIGATION N/A 03/11/2021   Procedure: APPLICATION OF CRANIAL NAVIGATION;  Surgeon: Cheryle Debby LABOR, MD;  Location: [facility] OR;  Service: Neurosurgery;  Laterality: N/A;   APPLICATION OF CRANIAL NAVIGATION N/A 03/20/2021   Procedure: APPLICATION OF CRANIAL NAVIGATION;  Surgeon: Cheryle Debby LABOR, MD;  Location: [facility] OR;  Service: Neurosurgery;  Laterality: N/A;   SUBOCCIPITAL CRANIECTOMY CERVICAL LAMINECTOMY N/A 03/11/2021   Procedure: SUBOCCIPITAL CRANIECTOMY FOR TUMOR WITH VENTRICULAR DRAIN;  Surgeon: Cheryle Debby LABOR, MD;  Location: [facility] OR;  Service: Neurosurgery;  Laterality: N/A;   SUBOCCIPITAL CRANIECTOMY CERVICAL LAMINECTOMY N/A 03/20/2021   Procedure: REDO SUBOCCIPITAL CRANIECTOMY FOR BRAIN TUMOR;  Surgeon: Cheryle Debby LABOR, MD;  Location: [facility] OR;  Service: Neurosurgery;  Laterality: N/A;   Social History:  Social History   Socioeconomic History   Marital status: Single    Spouse name: Not on file   Number of children: Not on file   Years of education: Not on file   Highest education level: Not on file  Occupational History   Not on file  Tobacco Use   Smoking status: Never   Smokeless tobacco: Never  Vaping Use   Vaping status: Never Used  Substance and Sexual Activity   Alcohol use: Not Currently    Comment: socially   Drug use: Yes   Sexual activity: Yes    Birth control/protection: Condom  Other Topics Concern   Not on file  Social History Narrative   Not on file  Social Drivers of Corporate investment banker Strain: Not on file  Food Insecurity: No Food Insecurity (02/26/2021)   Received from [facility]   Hunger Vital Sign    Within the past 12 months, you worried that your food would run out before you got the money to buy more.: Never  true    Within the past 12 months, the food you bought just didn't last and you didn't have money to get more.: Never true  Transportation Needs: Not on file  Physical Activity: Not on file  Stress: Not on file  Social Connections: Unknown (10/29/2021)   Received from [facility] [place]   Social Network    Social Network: Not on file  Intimate Partner Violence: Unknown (09/20/2021)   Received from [facility]   HITS    Physically Hurt: Not on file    Insult or Talk Down To: Not on file    Threaten Physical Harm: Not on file    Scream or Curse: Not on file   Family History:  Family History  Problem Relation Age of Onset   Other Brother    Thyroid cancer Maternal Aunt    Breast cancer Other    Thyroid cancer Other    Thyroid cancer Maternal Great-grandfather     Review of Systems: Constitutional: Doesn't report fevers, chills or abnormal weight loss Eyes: Doesn't report blurriness of vision Ears, nose, mouth, throat, and face: Doesn't report sore throat Respiratory: Doesn't report cough, dyspnea or wheezes Cardiovascular: Doesn't report palpitation, chest discomfort  Gastrointestinal:  Doesn't report nausea, constipation, diarrhea GU: Doesn't report incontinence Skin: Doesn't report skin rashes Neurological: Per HPI Musculoskeletal: Doesn't report joint pain Behavioral/Psych: Doesn't report anxiety  Physical Exam: Vitals:   03/01/24 1037  BP: 112/86  Pulse: 63  Resp: 18  Temp: 98.2 F (36.8 C)  SpO2: 100%    KPS: 90. General: Alert, cooperative, pleasant, in no acute distress Head: Normal EENT: No conjunctival injection or scleral icterus.  Lungs: Resp effort normal Cardiac: Regular rate Abdomen: Non-distended abdomen Skin: No rashes cyanosis or petechiae. Extremities: No clubbing or edema  Neurologic Exam: Mental Status: Awake, alert, attentive to examiner. Oriented to self and environment. Language is fluent with intact comprehension.  Cranial Nerves: Visual  acuity is grossly normal. Visual fields are full. Extra-ocular movements intact. No ptosis. Face is symmetric Motor: Tone and bulk are normal. Power is full in both arms and legs. Reflexes are symmetric, no pathologic reflexes present.  Sensory: Intact to light touch Gait: Normal.   Labs: I have reviewed the data as listed    Component Value Date/Time   NA 137 03/11/2021 0846   K 4.0 03/11/2021 0846   CL 104 03/10/2021 0439   CO2 19 (L) 03/10/2021 0439   GLUCOSE 130 (H) 03/10/2021 0439   BUN 23 (H) 03/10/2021 0439   CREATININE 0.97 03/20/2021 1217   CALCIUM  10.0 03/10/2021 0439   PROT 7.3 03/07/2021 0420   ALBUMIN 4.5 03/10/2021 0439   AST 14 (L) 03/07/2021 0420   ALT 14 03/07/2021 0420   ALKPHOS 40 03/07/2021 0420   BILITOT 1.0 03/07/2021 0420   GFRNONAA >60 03/20/2021 1217   GFRAA >60 07/12/2017 0433   Lab Results  Component Value Date   WBC 2.8 (L) 06/19/2021   NEUTROABS 1.4 (L) 06/19/2021   HGB 14.3 06/19/2021   HCT 38.9 (L) 06/19/2021   MCV 86.1 06/19/2021   PLT 191 06/19/2021    Imaging:  [facility] Clinician Interpretation: I have personally  reviewed the CNS images as listed.  My interpretation, in the context of the patient's clinical presentation, is stable disease  CLINICAL DATA:  Follow-up examination for brain/CNS neoplasm, assess treatment response. History of medulloblastoma status post resection.   EXAM: MRI HEAD WITHOUT AND WITH CONTRAST   TECHNIQUE: Multiplanar, multiecho pulse sequences of the brain and surrounding structures were obtained without and with intravenous contrast.   CONTRAST:  8mL GADAVIST  GADOBUTROL  1 MMOL/ML IV SOLN   COMPARISON:  MRI from 02/03/2023.   FINDINGS: Brain: Postoperative changes from prior left suboccipital craniotomy for left cerebellar mass resection. Resection cavity is stable in appearance as compared to previous. No enhancement to suggest residual or recurrent disease. Regional T2/FLAIR signal i intensities not  significantly changed or progressed.   Stable postoperative changes from prior right posterior approach ventriculostomy.   Cerebral volume within normal limits. No evidence for acute or subacute infarct. No other mass lesion, midline shift or mass effect. No hydrocephalus or extra-axial fluid collection. Pituitary gland suprasellar region within normal limits. No other abnormal enhancement.   Vascular: Major intracranial vascular flow voids are maintained.   Skull and upper cervical spine: Craniocervical junction with normal limits. Bone marrow signal intensity normal. No acute scalp soft tissue abnormality.   Sinuses/Orbits: Globes orbital soft tissues within normal limits. Chronic right sided paranasal sinusitis, progressed from prior. Mastoid air cells remain largely clear.   Other: None.   IMPRESSION: 1. Stable postoperative changes from prior left cerebellar mass resection. No evidence for residual or recurrent disease. 2. No other acute intracranial abnormality. 3. Chronic right-sided paranasal sinusitis, progressed from prior.     Electronically Signed   By: Morene Hoard M.D.   On: 06/25/2023 20:25   Assessment/Plan Medulloblastoma, adult (HCC)  Scott Snyder is clinically and radiographically stable today.  No new or progressive changes.  No progression of leukomalacia or T2 signal abnormality from treatment effect.  Clinical, radiographic, genetic profile and staging remain consistent with low or standard risk medulloblastoma.    We ask that Scott Snyder return to clinic in 6 months with MRI brain for evaluation, or sooner as needed.  Spine study can be done as well in the fall.  All questions were answered. The patient knows to call the clinic with any problems, questions or concerns. No barriers to learning were detected.  The total time spent in the encounter was 30 minutes and more than 50% was on counseling and review of test results   Arthea MARLA Manns, MD Medical Director of Neuro-Oncology [facility] at [place] [facility] 03/01/24 10:47 AM

## 2024-03-01 NOTE — Telephone Encounter (Signed)
 Scheduled appointments per 9/16 los. Talked with the patient and he is aware of the made appointment.

## 2025-02-06 ENCOUNTER — Ambulatory Visit: Admitting: Internal Medicine
